# Patient Record
Sex: Male | Born: 1960 | Race: White | Hispanic: No | Smoking: Current every day smoker
Health system: Southern US, Community
[De-identification: ages and names within clinical notes are randomized; demographics above are authoritative.]

## PROBLEM LIST (undated history)

## (undated) DIAGNOSIS — I779 Disorder of arteries and arterioles, unspecified: Secondary | ICD-10-CM

## (undated) DIAGNOSIS — I4891 Unspecified atrial fibrillation: Secondary | ICD-10-CM

## (undated) DIAGNOSIS — S31119A Laceration without foreign body of abdominal wall, unspecified quadrant without penetration into peritoneal cavity, initial encounter: Secondary | ICD-10-CM

## (undated) DIAGNOSIS — Z8619 Personal history of other infectious and parasitic diseases: Secondary | ICD-10-CM

## (undated) DIAGNOSIS — I639 Cerebral infarction, unspecified: Secondary | ICD-10-CM

## (undated) DIAGNOSIS — F39 Unspecified mood [affective] disorder: Secondary | ICD-10-CM

## (undated) DIAGNOSIS — C61 Malignant neoplasm of prostate: Secondary | ICD-10-CM

## (undated) DIAGNOSIS — G459 Transient cerebral ischemic attack, unspecified: Secondary | ICD-10-CM

## (undated) DIAGNOSIS — K219 Gastro-esophageal reflux disease without esophagitis: Secondary | ICD-10-CM

## (undated) DIAGNOSIS — Z85828 Personal history of other malignant neoplasm of skin: Secondary | ICD-10-CM

## (undated) DIAGNOSIS — I739 Peripheral vascular disease, unspecified: Secondary | ICD-10-CM

## (undated) DIAGNOSIS — R519 Headache, unspecified: Secondary | ICD-10-CM

## (undated) DIAGNOSIS — F32A Depression, unspecified: Secondary | ICD-10-CM

## (undated) DIAGNOSIS — R29818 Other symptoms and signs involving the nervous system: Secondary | ICD-10-CM

## (undated) DIAGNOSIS — I10 Essential (primary) hypertension: Secondary | ICD-10-CM

## (undated) DIAGNOSIS — I480 Paroxysmal atrial fibrillation: Secondary | ICD-10-CM

## (undated) DIAGNOSIS — E785 Hyperlipidemia, unspecified: Secondary | ICD-10-CM

## (undated) DIAGNOSIS — IMO0002 Reserved for concepts with insufficient information to code with codable children: Secondary | ICD-10-CM

## (undated) DIAGNOSIS — G4700 Insomnia, unspecified: Secondary | ICD-10-CM

## (undated) DIAGNOSIS — F419 Anxiety disorder, unspecified: Secondary | ICD-10-CM

## (undated) DIAGNOSIS — R51 Headache: Secondary | ICD-10-CM

## (undated) DIAGNOSIS — F329 Major depressive disorder, single episode, unspecified: Secondary | ICD-10-CM

## (undated) HISTORY — PX: STOMACH SURGERY: SHX791

## (undated) HISTORY — DX: Personal history of other infectious and parasitic diseases: Z86.19

## (undated) HISTORY — DX: Cerebral infarction, unspecified: I63.9

## (undated) HISTORY — DX: Paroxysmal atrial fibrillation: I48.0

## (undated) HISTORY — DX: Hyperlipidemia, unspecified: E78.5

## (undated) HISTORY — DX: Unspecified atrial fibrillation: I48.91

## (undated) HISTORY — DX: Personal history of other malignant neoplasm of skin: Z85.828

## (undated) HISTORY — DX: Insomnia, unspecified: G47.00

## (undated) HISTORY — DX: Unspecified mood (affective) disorder: F39

## (undated) HISTORY — DX: Gastro-esophageal reflux disease without esophagitis: K21.9

## (undated) HISTORY — DX: Malignant neoplasm of prostate: C61

## (undated) HISTORY — DX: Transient cerebral ischemic attack, unspecified: G45.9

## (undated) HISTORY — PX: OTHER SURGICAL HISTORY: SHX169

## (undated) HISTORY — DX: Laceration without foreign body of abdominal wall, unspecified quadrant without penetration into peritoneal cavity, initial encounter: S31.119A

## (undated) HISTORY — PX: CERVICAL FUSION: SHX112

## (undated) HISTORY — DX: Reserved for concepts with insufficient information to code with codable children: IMO0002

---

## 2004-07-27 HISTORY — PX: LUMBAR DISC SURGERY: SHX700

## 2004-12-18 ENCOUNTER — Observation Stay (HOSPITAL_COMMUNITY): Admission: RE | Admit: 2004-12-18 | Discharge: 2004-12-19 | Payer: Self-pay | Admitting: Orthopedic Surgery

## 2005-06-04 ENCOUNTER — Emergency Department (HOSPITAL_COMMUNITY): Admission: EM | Admit: 2005-06-04 | Discharge: 2005-06-04 | Payer: Self-pay | Admitting: Emergency Medicine

## 2005-09-27 ENCOUNTER — Emergency Department (HOSPITAL_COMMUNITY): Admission: EM | Admit: 2005-09-27 | Discharge: 2005-09-27 | Payer: Self-pay | Admitting: Emergency Medicine

## 2005-12-30 ENCOUNTER — Ambulatory Visit (HOSPITAL_COMMUNITY): Admission: RE | Admit: 2005-12-30 | Discharge: 2005-12-30 | Payer: Self-pay | Admitting: Orthopedic Surgery

## 2006-03-23 ENCOUNTER — Emergency Department (HOSPITAL_COMMUNITY): Admission: EM | Admit: 2006-03-23 | Discharge: 2006-03-23 | Payer: Self-pay | Admitting: Emergency Medicine

## 2006-08-28 ENCOUNTER — Emergency Department (HOSPITAL_COMMUNITY): Admission: EM | Admit: 2006-08-28 | Discharge: 2006-08-28 | Payer: Self-pay | Admitting: Emergency Medicine

## 2006-12-26 ENCOUNTER — Emergency Department (HOSPITAL_COMMUNITY): Admission: EM | Admit: 2006-12-26 | Discharge: 2006-12-26 | Payer: Self-pay | Admitting: Emergency Medicine

## 2007-05-29 ENCOUNTER — Emergency Department (HOSPITAL_COMMUNITY): Admission: EM | Admit: 2007-05-29 | Discharge: 2007-05-29 | Payer: Self-pay | Admitting: Emergency Medicine

## 2007-12-08 ENCOUNTER — Emergency Department (HOSPITAL_COMMUNITY): Admission: EM | Admit: 2007-12-08 | Discharge: 2007-12-08 | Payer: Self-pay | Admitting: Emergency Medicine

## 2008-08-23 ENCOUNTER — Ambulatory Visit (HOSPITAL_COMMUNITY): Admission: RE | Admit: 2008-08-23 | Discharge: 2008-08-23 | Payer: Self-pay | Admitting: Internal Medicine

## 2008-12-24 ENCOUNTER — Emergency Department (HOSPITAL_COMMUNITY): Admission: EM | Admit: 2008-12-24 | Discharge: 2008-12-24 | Payer: Self-pay | Admitting: Emergency Medicine

## 2008-12-26 ENCOUNTER — Ambulatory Visit (HOSPITAL_COMMUNITY): Admission: RE | Admit: 2008-12-26 | Discharge: 2008-12-26 | Payer: Self-pay | Admitting: Orthopaedic Surgery

## 2009-01-04 ENCOUNTER — Encounter (HOSPITAL_COMMUNITY): Admission: RE | Admit: 2009-01-04 | Discharge: 2009-02-03 | Payer: Self-pay | Admitting: Orthopaedic Surgery

## 2009-07-29 ENCOUNTER — Encounter: Admission: RE | Admit: 2009-07-29 | Discharge: 2009-09-11 | Payer: Self-pay | Admitting: Neurosurgery

## 2009-09-03 ENCOUNTER — Ambulatory Visit (HOSPITAL_COMMUNITY): Admission: RE | Admit: 2009-09-03 | Discharge: 2009-09-03 | Payer: Self-pay | Admitting: Neurosurgery

## 2009-11-12 ENCOUNTER — Ambulatory Visit (HOSPITAL_COMMUNITY): Admission: RE | Admit: 2009-11-12 | Discharge: 2009-11-13 | Payer: Self-pay | Admitting: Neurosurgery

## 2010-02-03 ENCOUNTER — Emergency Department (HOSPITAL_COMMUNITY): Admission: EM | Admit: 2010-02-03 | Discharge: 2010-02-03 | Payer: Self-pay | Admitting: Emergency Medicine

## 2010-05-23 ENCOUNTER — Encounter: Payer: Self-pay | Admitting: Cardiology

## 2010-05-29 ENCOUNTER — Encounter: Payer: Self-pay | Admitting: Cardiology

## 2010-06-20 ENCOUNTER — Ambulatory Visit (HOSPITAL_COMMUNITY): Admission: RE | Admit: 2010-06-20 | Discharge: 2010-06-20 | Payer: Self-pay | Admitting: Neurology

## 2010-07-14 ENCOUNTER — Encounter (INDEPENDENT_AMBULATORY_CARE_PROVIDER_SITE_OTHER): Payer: Self-pay | Admitting: *Deleted

## 2010-07-14 ENCOUNTER — Encounter: Payer: Self-pay | Admitting: Physician Assistant

## 2010-07-14 ENCOUNTER — Ambulatory Visit: Payer: Self-pay | Admitting: Cardiology

## 2010-07-14 DIAGNOSIS — R002 Palpitations: Secondary | ICD-10-CM

## 2010-07-14 DIAGNOSIS — I10 Essential (primary) hypertension: Secondary | ICD-10-CM

## 2010-07-14 DIAGNOSIS — R072 Precordial pain: Secondary | ICD-10-CM

## 2010-07-14 DIAGNOSIS — Z720 Tobacco use: Secondary | ICD-10-CM

## 2010-07-14 DIAGNOSIS — R0602 Shortness of breath: Secondary | ICD-10-CM

## 2010-07-14 DIAGNOSIS — R0609 Other forms of dyspnea: Secondary | ICD-10-CM | POA: Insufficient documentation

## 2010-07-14 DIAGNOSIS — G4700 Insomnia, unspecified: Secondary | ICD-10-CM

## 2010-07-14 DIAGNOSIS — E785 Hyperlipidemia, unspecified: Secondary | ICD-10-CM | POA: Insufficient documentation

## 2010-07-14 DIAGNOSIS — R0989 Other specified symptoms and signs involving the circulatory and respiratory systems: Secondary | ICD-10-CM

## 2010-07-14 DIAGNOSIS — F411 Generalized anxiety disorder: Secondary | ICD-10-CM

## 2010-07-14 DIAGNOSIS — R7989 Other specified abnormal findings of blood chemistry: Secondary | ICD-10-CM | POA: Insufficient documentation

## 2010-07-25 ENCOUNTER — Encounter: Payer: Self-pay | Admitting: Physician Assistant

## 2010-08-04 ENCOUNTER — Encounter (INDEPENDENT_AMBULATORY_CARE_PROVIDER_SITE_OTHER): Payer: Self-pay | Admitting: *Deleted

## 2010-08-26 NOTE — Letter (Signed)
Summary: External Correspondence/ PROGRESS NOTE DR. TYSINGER  External Correspondence/ PROGRESS NOTE DR. TYSINGER   Imported By: Dorise Hiss 06/11/2010 11:20:26  _____________________________________________________________________  External Attachment:    Type:   Image     Comment:   External Document

## 2010-08-26 NOTE — Letter (Signed)
Summary: External Correspondence/ PROGRESS NOTE DR. TYSINGER  External Correspondence/ PROGRESS NOTE DR. TYSINGER   Imported By: Dorise Hiss 06/11/2010 11:18:46  _____________________________________________________________________  External Attachment:    Type:   Image     Comment:   External Document

## 2010-08-27 ENCOUNTER — Ambulatory Visit: Admit: 2010-08-27 | Payer: Self-pay | Admitting: Cardiology

## 2010-08-27 ENCOUNTER — Ambulatory Visit: Payer: Medicaid Other | Admitting: Cardiology

## 2010-08-28 NOTE — Letter (Signed)
Summary: Internal Other/ PATIENT HISTORY FORM  Internal Other/ PATIENT HISTORY FORM   Imported By: Dorise Hiss 07/18/2010 10:49:15  _____________________________________________________________________  External Attachment:    Type:   Image     Comment:   External Document
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
## 2010-08-28 NOTE — Letter (Signed)
Summary: Dobutamine Echocardiogram  [facility] at [facility] [address]   [place], [place] [address]   Phone: [phone]  Fax: [phone]      [facility]  Dobutamine Echocardiogram    Jonelle Sidle  Appointment Date:_  Appointment Time:_   Your doctor has ordered a Dobutamine echo to help determine the condition of our heart. If you take blood pressure medication, ask your doctor if you should take your medications the day of the procedure. Do not eat and drink 4 hours before the test is scheduled.  You will be asked to undress from the waist up and given a hospital gown to wear, so dress comfortaly from the waist down.  You will need to register at the Outpatient Department at [facility], located at the front enttrance of the hospital. Make sure to bring your insurance information and a form of ID.  Your appointment will last approximately one and one-half hours

## 2010-08-28 NOTE — Letter (Signed)
Summary: Engineer, materials at [facility]  [address]   [place], [place] [address]   Phone: [phone]  Fax: [phone]        August 04, 2010 MRN: [id number]   GIOMAR GUSLER [address] [place], [place]  [address]   Dear Mr. HARTSELL,  Your test ordered by Selena Batten has been reviewed by your physician (or physician assistant) and was found to be normal or stable. Your physician (or physician assistant) felt no changes were needed at this time.  __X__ Echocardiogram  __X__ Cardiac Stress Test  ____ Lab Work  ____ Peripheral vascular study of arms, legs or neck  ____ CT scan or X-ray  ____ Lung or Breathing test  ____ Other:   Thank you.   Hoover Brunette, LPN    Duane Boston, M.D., F.A.C.C. Thressa Sheller, M.D., F.A.C.C. Oneal Grout, M.D., F.A.C.C. Cheree Ditto, M.D., F.A.C.C. Daiva Nakayama, M.D., F.A.C.C. Kenney Houseman, M.D., F.A.C.C. Jeanne Ivan, PA-C

## 2010-10-12 LAB — BASIC METABOLIC PANEL
BUN: 12 mg/dL (ref 6–23)
Chloride: 103 mEq/L (ref 96–112)
GFR calc non Af Amer: >60 mL/min (ref >60)
Potassium: 3.7 mEq/L (ref 3.5–5.1)
Sodium: 135 mEq/L (ref 135–145)

## 2010-10-15 LAB — BASIC METABOLIC PANEL
CO2: 29 mEq/L (ref 19–32)
Chloride: 102 mEq/L (ref 96–112)
Creatinine, Ser: 1 mg/dL (ref 0.4–1.5)
GFR calc Af Amer: >60 mL/min (ref >60)
Potassium: 4 mEq/L (ref 3.5–5.1)

## 2010-10-15 LAB — CBC
HCT: 47.2 % (ref 39.0–52.0)
Hemoglobin: 16.8 g/dL (ref 13.0–17.0)
MCHC: 35.5 g/dL (ref 30.0–36.0)
MCV: 95.7 fL (ref 78.0–100.0)
RBC: 4.93 MIL/uL (ref 4.22–5.81)
WBC: 6.4 10*3/uL (ref 4.0–10.5)

## 2010-10-15 LAB — HEPATIC FUNCTION PANEL
AST: 21 U/L (ref 0–37)
Albumin: 4.1 g/dL (ref 3.5–5.2)

## 2010-10-15 LAB — SURGICAL PCR SCREEN
MRSA, PCR: NEGATIVE
Staphylococcus aureus: NEGATIVE

## 2010-10-15 LAB — PROTIME-INR: INR: 0.97 (ref 0.00–1.49)

## 2010-12-12 NOTE — Op Note (Signed)
NAMEMONTREZ, Calvin                ACCOUNT NO.:  [id number]   MEDICAL RECORD NO.:  [id number]          PATIENT TYPE:  AMB   LOCATION:  DAY                          FACILITY:  [facility]   PHYSICIAN:  Georges Lynch. Gioffre, M.D.DATE OF BIRTH:  1961-01-20   DATE OF PROCEDURE:  12/18/2004  DATE OF DISCHARGE:                                 OPERATIVE REPORT   SURGEON:  Georges Lynch. Darrelyn Hillock, M.D.   ASSISTANT:  Marlowe Kays, M.D.   PREOPERATIVE DIAGNOSIS:  Large herniated lumbar disk, L5-S1, on the right.   POSTOPERATIVE DIAGNOSIS:  Large herniated lumbar disk, L5-S1, on the right.   OPERATION:  Hemilaminectomy and microdiskectomy at L5-S1 on the right.   PROCEDURE:  Under general anesthesia, routine orthopedic prepping and  draping of the lower back carried out. The patient was given 1 g of IV  Ancef. Two needles were placed in the back for localization purposes. An x-  ray was taken to verify the position. Once we identified the L5-S1 position,  incision was made directly over the interspace. Bleeders identified and  cauterized. I then stripped the muscle from the lamina and spinous process  in usual fashion, identified the sacrum, and then L5-S1 interspace. The self-  retaining _____________ retractors were inserted. I then went down and did a  hemilaminectomy in usual fashion at L5-S1, identified ligamentum flavum, and  removed the ligamentum flavum with great care taken not to injure the  underlying dura. At this time, we identified the dura and the S1 root. We  gently retracted the root and the dura. We cauterized the lateral recessed  vein with a bipolar. We identified the disk. He had a large herniated disk.  The cruciate incision was made in the disk space, and complete diskectomy  was carried out. We made multiple passes into the disk space and  subligamentous to make sure there were no other fragments. There was not. I  then utilized the hockey-stick, and I went out the foramen,  followed the S1  root. It was now clear, and the dura above and root above were fine. I then  irrigated out the area again thoroughly and then loosely applied the  thrombin-soaked Gelfoam. I closed the wound and layered in the usual  fashion. I left the distal deep part of the wound partially open for  drainage purposes as well, and the remaining part of the wound was closed  with 0 Vicryl. The skin was closed with metal staples, and a sterile  neosporin dressing was applied. Prior to closing the skin, I injected 20 cc  of half percent Marcaine and epinephrine into the underlying muscle, and he  was given 30 mg IV of Toradol.      RAG/MEDQ  D:  12/18/2004  T:  12/18/2004  Job:  [id number]

## 2011-02-11 ENCOUNTER — Other Ambulatory Visit: Payer: Self-pay | Admitting: Medical

## 2011-02-17 ENCOUNTER — Other Ambulatory Visit: Payer: Self-pay | Admitting: Medical

## 2011-03-03 ENCOUNTER — Other Ambulatory Visit: Payer: Self-pay | Admitting: Medical

## 2011-04-23 ENCOUNTER — Other Ambulatory Visit: Payer: Self-pay

## 2011-04-23 ENCOUNTER — Encounter: Payer: Self-pay | Admitting: Vascular Surgery

## 2011-04-24 ENCOUNTER — Other Ambulatory Visit: Payer: Medicaid Other

## 2011-04-27 ENCOUNTER — Encounter: Payer: Self-pay | Admitting: Vascular Surgery

## 2011-04-28 ENCOUNTER — Encounter: Payer: Medicaid Other | Admitting: Vascular Surgery

## 2011-06-26 ENCOUNTER — Other Ambulatory Visit: Payer: Self-pay

## 2011-07-01 ENCOUNTER — Encounter: Payer: Self-pay | Admitting: Vascular Surgery

## 2011-07-28 DIAGNOSIS — I639 Cerebral infarction, unspecified: Secondary | ICD-10-CM

## 2011-07-28 HISTORY — DX: Cerebral infarction, unspecified: I63.9

## 2011-08-05 ENCOUNTER — Encounter: Payer: Self-pay | Admitting: Vascular Surgery

## 2011-08-06 ENCOUNTER — Encounter: Payer: Self-pay | Admitting: Vascular Surgery

## 2011-08-06 ENCOUNTER — Ambulatory Visit (INDEPENDENT_AMBULATORY_CARE_PROVIDER_SITE_OTHER): Payer: Medicaid Other | Admitting: *Deleted

## 2011-08-06 ENCOUNTER — Ambulatory Visit (INDEPENDENT_AMBULATORY_CARE_PROVIDER_SITE_OTHER): Payer: Medicaid Other | Admitting: Vascular Surgery

## 2011-08-06 VITALS — BP 88/45 | HR 70 | Resp 16 | Ht 75.0 in | Wt 221.0 lb

## 2011-08-06 DIAGNOSIS — I6529 Occlusion and stenosis of unspecified carotid artery: Secondary | ICD-10-CM

## 2011-08-06 DIAGNOSIS — I779 Disorder of arteries and arterioles, unspecified: Secondary | ICD-10-CM
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
## 2011-08-14 ENCOUNTER — Ambulatory Visit: Payer: Medicaid Other | Admitting: Urology

## 2012-02-03 ENCOUNTER — Encounter: Payer: Self-pay | Admitting: Neurosurgery

## 2012-02-04 ENCOUNTER — Ambulatory Visit: Payer: Medicaid Other | Admitting: Neurosurgery

## 2012-02-04 ENCOUNTER — Other Ambulatory Visit: Payer: Medicaid Other

## 2012-09-16 ENCOUNTER — Emergency Department (HOSPITAL_COMMUNITY): Payer: Medicaid Other

## 2012-09-16 ENCOUNTER — Emergency Department (HOSPITAL_COMMUNITY)
Admission: EM | Admit: 2012-09-16 | Discharge: 2012-09-16 | Disposition: A | Discharge status: Home / Self Care | Payer: Medicaid Other | Attending: Emergency Medicine | Admitting: Emergency Medicine

## 2012-09-16 ENCOUNTER — Encounter (HOSPITAL_COMMUNITY): Payer: Self-pay

## 2012-09-16 DIAGNOSIS — Z8639 Personal history of other endocrine, nutritional and metabolic disease: Secondary | ICD-10-CM | POA: Insufficient documentation

## 2012-09-16 DIAGNOSIS — Z87828 Personal history of other (healed) physical injury and trauma: Secondary | ICD-10-CM | POA: Insufficient documentation

## 2012-09-16 DIAGNOSIS — Z8659 Personal history of other mental and behavioral disorders: Secondary | ICD-10-CM | POA: Insufficient documentation

## 2012-09-16 DIAGNOSIS — Z79899 Other long term (current) drug therapy: Secondary | ICD-10-CM | POA: Insufficient documentation

## 2012-09-16 DIAGNOSIS — F17200 Nicotine dependence, unspecified, uncomplicated: Secondary | ICD-10-CM | POA: Insufficient documentation

## 2012-09-16 DIAGNOSIS — G8929 Other chronic pain: Secondary | ICD-10-CM | POA: Insufficient documentation

## 2012-09-16 DIAGNOSIS — K219 Gastro-esophageal reflux disease without esophagitis: Secondary | ICD-10-CM | POA: Insufficient documentation

## 2012-09-16 DIAGNOSIS — Z8619 Personal history of other infectious and parasitic diseases: Secondary | ICD-10-CM | POA: Insufficient documentation

## 2012-09-16 DIAGNOSIS — Z862 Personal history of diseases of the blood and blood-forming organs and certain disorders involving the immune mechanism: Secondary | ICD-10-CM | POA: Insufficient documentation

## 2012-09-16 DIAGNOSIS — S8990XA Unspecified injury of unspecified lower leg, initial encounter: Secondary | ICD-10-CM | POA: Insufficient documentation

## 2012-09-16 DIAGNOSIS — Z8739 Personal history of other diseases of the musculoskeletal system and connective tissue: Secondary | ICD-10-CM | POA: Insufficient documentation

## 2012-09-16 DIAGNOSIS — M549 Dorsalgia, unspecified: Secondary | ICD-10-CM | POA: Insufficient documentation

## 2012-09-16 DIAGNOSIS — Z8673 Personal history of transient ischemic attack (TIA), and cerebral infarction without residual deficits: Secondary | ICD-10-CM | POA: Insufficient documentation

## 2012-09-16 DIAGNOSIS — M542 Cervicalgia: Secondary | ICD-10-CM | POA: Insufficient documentation

## 2012-09-16 DIAGNOSIS — Z791 Long term (current) use of non-steroidal anti-inflammatories (NSAID): Secondary | ICD-10-CM | POA: Insufficient documentation

## 2012-09-16 DIAGNOSIS — G4700 Insomnia, unspecified: Secondary | ICD-10-CM | POA: Insufficient documentation

## 2012-09-16 DIAGNOSIS — I10 Essential (primary) hypertension: Secondary | ICD-10-CM | POA: Insufficient documentation

## 2012-09-16 DIAGNOSIS — S8992XA Unspecified injury of left lower leg, initial encounter: Secondary | ICD-10-CM

## 2012-09-16 MED ORDER — HYDROMORPHONE HCL PF 2 MG/ML IJ SOLN
2.0000 mg | Freq: Once | INTRAMUSCULAR | Status: AC
  Administered 2012-09-16: 2 mg via INTRAMUSCULAR
  Filled 2012-09-16: qty 1

## 2012-09-16 MED ORDER — HYDROCODONE-ACETAMINOPHEN 5-325 MG PO TABS: 1.0000 | ORAL_TABLET | Freq: Four times a day (QID) | ORAL | Status: DC | PRN

## 2012-09-16 MED ORDER — NAPROXEN 500 MG PO TABS: 500.0000 mg | ORAL_TABLET | Freq: Two times a day (BID) | ORAL | Status: DC
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
## 2012-09-28 ENCOUNTER — Telehealth: Payer: Self-pay | Admitting: Orthopedic Surgery

## 2012-09-28 NOTE — Telephone Encounter (Signed)
Patient called, then immediately stopped in to our office requesting appointment for evaluation following Emergency Room visit at [facility].  He states it is for left knee; report indicates chronic back as main problem.  We reviewed insurance requirement per his [place] Medicaid, which requires referral from primary care physician prior to scheduling appointment, as well as need for Dr. Romeo Apple to review prior to scheduling if we receive the referral.  Patient's ph# [phone]. Patient understands and will try to contact primary care.

## 2012-11-24 ENCOUNTER — Telehealth: Payer: Self-pay | Admitting: Medical

## 2012-11-24 NOTE — Telephone Encounter (Signed)
error 

## 2013-03-29 ENCOUNTER — Telehealth: Payer: Self-pay | Admitting: Orthopedic Surgery

## 2013-03-29 NOTE — Telephone Encounter (Signed)
Call received from Charlene at Dr. Otilio Saber office, ph [phone], states patient was seen for the first time in their office, 03/28/13, and is requesting referral appointment for right knee pain. Advised office note to be faxed, and Dr. Romeo Apple may need to review and advise.  * Please review frequent emergency room visits, Xrays, and previous office and operative notes. Patient had seen both Dr. Romeo Apple and Dr. Hilda Lias. Please advise regarding appointment for right knee.  Patient ph# [phone].

## 2014-05-17 ENCOUNTER — Observation Stay (HOSPITAL_COMMUNITY)
Admission: EM | Admit: 2014-05-17 | Discharge: 2014-05-18 | Disposition: A | Discharge status: Home / Self Care | Payer: Medicaid Other | Attending: Internal Medicine | Admitting: Internal Medicine

## 2014-05-17 ENCOUNTER — Emergency Department (HOSPITAL_COMMUNITY): Payer: Medicaid Other

## 2014-05-17 ENCOUNTER — Encounter (HOSPITAL_COMMUNITY): Payer: Self-pay | Admitting: Emergency Medicine

## 2014-05-17 DIAGNOSIS — F17200 Nicotine dependence, unspecified, uncomplicated: Secondary | ICD-10-CM

## 2014-05-17 DIAGNOSIS — Z8673 Personal history of transient ischemic attack (TIA), and cerebral infarction without residual deficits: Secondary | ICD-10-CM | POA: Diagnosis not present

## 2014-05-17 DIAGNOSIS — E782 Mixed hyperlipidemia: Secondary | ICD-10-CM

## 2014-05-17 DIAGNOSIS — R0789 Other chest pain: Principal | ICD-10-CM | POA: Insufficient documentation

## 2014-05-17 DIAGNOSIS — E785 Hyperlipidemia, unspecified: Secondary | ICD-10-CM | POA: Diagnosis present

## 2014-05-17 DIAGNOSIS — I10 Essential (primary) hypertension: Secondary | ICD-10-CM | POA: Diagnosis not present

## 2014-05-17 DIAGNOSIS — F39 Unspecified mood [affective] disorder: Secondary | ICD-10-CM | POA: Diagnosis not present

## 2014-05-17 DIAGNOSIS — Z79899 Other long term (current) drug therapy: Secondary | ICD-10-CM | POA: Diagnosis not present

## 2014-05-17 DIAGNOSIS — G4700 Insomnia, unspecified: Secondary | ICD-10-CM | POA: Insufficient documentation

## 2014-05-17 DIAGNOSIS — Z8619 Personal history of other infectious and parasitic diseases: Secondary | ICD-10-CM | POA: Diagnosis not present

## 2014-05-17 DIAGNOSIS — Z791 Long term (current) use of non-steroidal anti-inflammatories (NSAID): Secondary | ICD-10-CM | POA: Diagnosis not present

## 2014-05-17 DIAGNOSIS — R002 Palpitations: Secondary | ICD-10-CM

## 2014-05-17 DIAGNOSIS — I209 Angina pectoris, unspecified: Secondary | ICD-10-CM

## 2014-05-17 DIAGNOSIS — M5116 Intervertebral disc disorders with radiculopathy, lumbar region: Secondary | ICD-10-CM | POA: Diagnosis not present

## 2014-05-17 DIAGNOSIS — K219 Gastro-esophageal reflux disease without esophagitis: Secondary | ICD-10-CM | POA: Diagnosis not present

## 2014-05-17 DIAGNOSIS — I6522 Occlusion and stenosis of left carotid artery: Secondary | ICD-10-CM

## 2014-05-17 DIAGNOSIS — N179 Acute kidney failure, unspecified: Secondary | ICD-10-CM | POA: Diagnosis present

## 2014-05-17 DIAGNOSIS — Z87828 Personal history of other (healed) physical injury and trauma: Secondary | ICD-10-CM | POA: Insufficient documentation

## 2014-05-17 DIAGNOSIS — R079 Chest pain, unspecified: Secondary | ICD-10-CM | POA: Diagnosis present

## 2014-05-17 DIAGNOSIS — Z720 Tobacco use: Secondary | ICD-10-CM | POA: Diagnosis present

## 2014-05-17 DIAGNOSIS — I517 Cardiomegaly: Secondary | ICD-10-CM

## 2014-05-17 DIAGNOSIS — R0602 Shortness of breath: Secondary | ICD-10-CM

## 2014-05-17 DIAGNOSIS — R072 Precordial pain: Secondary | ICD-10-CM

## 2014-05-17 HISTORY — DX: Essential (primary) hypertension: I10

## 2014-05-17 HISTORY — DX: Peripheral vascular disease, unspecified: I73.9

## 2014-05-17 HISTORY — DX: Disorder of arteries and arterioles, unspecified: I77.9

## 2014-05-17 LAB — CBC
HCT: 44 % (ref 39.0–52.0)
HCT: 44.4 % (ref 39.0–52.0)
Hemoglobin: 16.3 g/dL (ref 13.0–17.0)
Hemoglobin: 16.5 g/dL (ref 13.0–17.0)
MCH: 32.9 pg (ref 26.0–34.0)
MCH: 33.1 pg (ref 26.0–34.0)
MCHC: 37 g/dL — ABNORMAL HIGH (ref 30.0–36.0)
MCHC: 37.2 g/dL — AB (ref 30.0–36.0)
MCV: 88.9 fL (ref 78.0–100.0)
MCV: 89 fL (ref 78.0–100.0)
PLATELETS: 225 10*3/uL (ref 150–400)
Platelets: 209 10*3/uL (ref 150–400)
RBC: 4.95 MIL/uL (ref 4.22–5.81)
RBC: 4.99 MIL/uL (ref 4.22–5.81)
RDW: 13.5 % (ref 11.5–15.5)
RDW: 13.5 % (ref 11.5–15.5)
WBC: 8.3 10*3/uL (ref 4.0–10.5)
WBC: 8 10*3/uL (ref 4.0–10.5)

## 2014-05-17 LAB — BASIC METABOLIC PANEL
ANION GAP: 13 (ref 5–15)
BUN: 11 mg/dL (ref 6–23)
CALCIUM: 9.4 mg/dL (ref 8.4–10.5)
CO2: 27 meq/L (ref 19–32)
CREATININE: 1.22 mg/dL (ref 0.50–1.35)
Chloride: 97 mEq/L (ref 96–112)
GFR calc Af Amer: 76 mL/min — ABNORMAL LOW (ref >90)
GFR calc non Af Amer: 66 mL/min — ABNORMAL LOW (ref >90)
GLUCOSE: 92 mg/dL (ref 70–99)
Potassium: 3.4 mEq/L — ABNORMAL LOW (ref 3.7–5.3)
Sodium: 137 mEq/L (ref 137–147)

## 2014-05-17 LAB — CREATININE, SERUM
Creatinine, Ser: 1.35 mg/dL (ref 0.50–1.35)
GFR calc Af Amer: 67 mL/min — ABNORMAL LOW (ref >90)
GFR, EST NON AFRICAN AMERICAN: 58 mL/min — AB (ref >90)

## 2014-05-17 LAB — TROPONIN I
Troponin I: <0.3 ng/mL (ref <0.30)
Troponin I: <0.3 ng/mL (ref <0.30)
Troponin I: <0.3 ng/mL (ref <0.30)
Troponin I: <0.3 ng/mL (ref <0.30)

## 2014-05-17 MED ORDER — NITROGLYCERIN 0.4 MG SL SUBL: 0.4000 mg | SUBLINGUAL_TABLET | SUBLINGUAL | Status: DC | PRN

## 2014-05-17 MED ORDER — REGADENOSON 0.4 MG/5ML IV SOLN
0.4000 mg | Freq: Once | INTRAVENOUS | Status: DC
  Filled 2014-05-17: qty 5

## 2014-05-17 MED ORDER — ASPIRIN 325 MG PO TABS
325.0000 mg | ORAL_TABLET | Freq: Every day | ORAL | Status: DC
  Administered 2014-05-18: 325 mg via ORAL
  Filled 2014-05-17: qty 1

## 2014-05-17 MED ORDER — CYCLOBENZAPRINE HCL 10 MG PO TABS
10.0000 mg | ORAL_TABLET | Freq: Once | ORAL | Status: AC
  Administered 2014-05-17: 10 mg via ORAL
  Filled 2014-05-17: qty 1

## 2014-05-17 MED ORDER — TRIAMTERENE-HCTZ 37.5-25 MG PO TABS
1.0000 | ORAL_TABLET | Freq: Every day | ORAL | Status: DC
  Administered 2014-05-17 – 2014-05-18 (×2): 1 via ORAL
  Filled 2014-05-17 (×2): qty 1

## 2014-05-17 MED ORDER — ENOXAPARIN SODIUM 40 MG/0.4ML ~~LOC~~ SOLN
40.0000 mg | SUBCUTANEOUS | Status: DC
  Administered 2014-05-17: 40 mg via SUBCUTANEOUS
  Filled 2014-05-17: qty 0.4

## 2014-05-17 MED ORDER — MORPHINE SULFATE 4 MG/ML IJ SOLN
4.0000 mg | Freq: Once | INTRAMUSCULAR | Status: AC
  Administered 2014-05-17: 4 mg via INTRAVENOUS
  Filled 2014-05-17: qty 1

## 2014-05-17 MED ORDER — PANTOPRAZOLE SODIUM 40 MG PO TBEC
40.0000 mg | DELAYED_RELEASE_TABLET | Freq: Every day | ORAL | Status: DC
  Administered 2014-05-17 – 2014-05-18 (×2): 40 mg via ORAL
  Filled 2014-05-17 (×2): qty 1

## 2014-05-17 MED ORDER — GI COCKTAIL ~~LOC~~
30.0000 mL | Freq: Four times a day (QID) | ORAL | Status: DC | PRN
Start: 2014-05-17 — End: 2014-05-18

## 2014-05-17 MED ORDER — ONDANSETRON HCL 4 MG/2ML IJ SOLN
4.0000 mg | Freq: Four times a day (QID) | INTRAMUSCULAR | Status: DC | PRN
  Administered 2014-05-17: 4 mg via INTRAVENOUS
  Filled 2014-05-17: qty 2

## 2014-05-17 MED ORDER — TRAZODONE HCL 50 MG PO TABS
50.0000 mg | ORAL_TABLET | Freq: Every day | ORAL | Status: DC
  Administered 2014-05-17: 50 mg via ORAL
  Filled 2014-05-17: qty 1

## 2014-05-17 MED ORDER — ACETAMINOPHEN 325 MG PO TABS
650.0000 mg | ORAL_TABLET | ORAL | Status: DC | PRN
  Administered 2014-05-18: 650 mg via ORAL
  Filled 2014-05-17: qty 2

## 2014-05-17 MED ORDER — MORPHINE SULFATE 2 MG/ML IJ SOLN
1.0000 mg | INTRAMUSCULAR | Status: DC | PRN
  Administered 2014-05-17 – 2014-05-18 (×3): 1 mg via INTRAVENOUS
  Filled 2014-05-17 (×3): qty 1

## 2014-05-17 MED ORDER — NITROGLYCERIN 2 % TD OINT
1.0000 [in_us] | TOPICAL_OINTMENT | Freq: Once | TRANSDERMAL | Status: AC
  Administered 2014-05-17: 1 [in_us] via TOPICAL
  Filled 2014-05-17: qty 1

## 2014-05-17 MED ORDER — SODIUM CHLORIDE 0.9 % IV SOLN
INTRAVENOUS | Status: DC
  Administered 2014-05-17: 15:00:00 via INTRAVENOUS

## 2014-05-17 MED ORDER — ASPIRIN 325 MG PO TABS
325.0000 mg | ORAL_TABLET | Freq: Once | ORAL | Status: AC
  Administered 2014-05-17: 325 mg via ORAL
  Filled 2014-05-17: qty 1
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
## 2014-05-17 NOTE — ED Notes (Signed)
Chest pain and sob off and on times one month.  Since yesterday the cp has been a constant sharp pain .  Rates a 5/10.
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
## 2014-05-17 NOTE — H&P (Signed)
Patient seen and examined. Agree with note by Dyanne Carrel, NP. Patient here with CP with significant risk factors, HEART score of 6 and an abnormal EKG. Has already been evaluated by cardiology with plans for a stress test in am as long as he rules out.  Domingo Mend, MD [facility] Pager: [phone]

## 2014-05-17 NOTE — Progress Notes (Signed)
  Echocardiogram 2D Echocardiogram has been performed.  [place], [place] 05/17/2014, 4:31 PM

## 2014-05-18 ENCOUNTER — Encounter (HOSPITAL_COMMUNITY): Payer: Self-pay

## 2014-05-18 ENCOUNTER — Observation Stay (HOSPITAL_COMMUNITY): Payer: Medicaid Other

## 2014-05-18 DIAGNOSIS — I10 Essential (primary) hypertension: Secondary | ICD-10-CM

## 2014-05-18 DIAGNOSIS — K219 Gastro-esophageal reflux disease without esophagitis: Secondary | ICD-10-CM | POA: Diagnosis not present

## 2014-05-18 DIAGNOSIS — N179 Acute kidney failure, unspecified: Secondary | ICD-10-CM | POA: Diagnosis present

## 2014-05-18 DIAGNOSIS — R0789 Other chest pain: Secondary | ICD-10-CM

## 2014-05-18 DIAGNOSIS — I6522 Occlusion and stenosis of left carotid artery: Secondary | ICD-10-CM

## 2014-05-18 DIAGNOSIS — E785 Hyperlipidemia, unspecified: Secondary | ICD-10-CM | POA: Diagnosis not present

## 2014-05-18 DIAGNOSIS — R079 Chest pain, unspecified: Secondary | ICD-10-CM

## 2014-05-18 HISTORY — DX: Acute kidney failure, unspecified: N17.9

## 2014-05-18 LAB — LIPID PANEL
Cholesterol: 195 mg/dL (ref 0–200)
HDL: 25 mg/dL — ABNORMAL LOW (ref >39)
LDL CALC: UNDETERMINED mg/dL (ref 0–99)
TRIGLYCERIDES: 624 mg/dL — AB (ref <150)
Total CHOL/HDL Ratio: 7.8 RATIO
VLDL: UNDETERMINED mg/dL (ref 0–40)

## 2014-05-18 LAB — CBC
HEMATOCRIT: 43.5 % (ref 39.0–52.0)
HEMOGLOBIN: 16 g/dL (ref 13.0–17.0)
MCH: 33.1 pg (ref 26.0–34.0)
MCHC: 36.8 g/dL — AB (ref 30.0–36.0)
MCV: 89.9 fL (ref 78.0–100.0)
Platelets: 238 10*3/uL (ref 150–400)
RBC: 4.84 MIL/uL (ref 4.22–5.81)
RDW: 13.5 % (ref 11.5–15.5)
WBC: 8 10*3/uL (ref 4.0–10.5)

## 2014-05-18 LAB — BASIC METABOLIC PANEL
ANION GAP: 13 (ref 5–15)
BUN: 15 mg/dL (ref 6–23)
CALCIUM: 9 mg/dL (ref 8.4–10.5)
CO2: 28 meq/L (ref 19–32)
CREATININE: 1.47 mg/dL — AB (ref 0.50–1.35)
Chloride: 98 mEq/L (ref 96–112)
GFR calc Af Amer: 61 mL/min — ABNORMAL LOW (ref >90)
GFR calc non Af Amer: 52 mL/min — ABNORMAL LOW (ref >90)
Glucose, Bld: 118 mg/dL — ABNORMAL HIGH (ref 70–99)
Potassium: 3.6 mEq/L — ABNORMAL LOW (ref 3.7–5.3)
Sodium: 139 mEq/L (ref 137–147)

## 2014-05-18 MED ORDER — SODIUM CHLORIDE 0.9 % IJ SOLN
INTRAMUSCULAR | Status: AC
  Administered 2014-05-18: 10 mL via INTRAVENOUS
  Filled 2014-05-18: qty 10

## 2014-05-18 MED ORDER — TECHNETIUM TC 99M SESTAMIBI - CARDIOLITE
10.0000 | Freq: Once | INTRAVENOUS | Status: AC | PRN
Start: 2014-05-18 — End: 2014-05-18
  Administered 2014-05-18: 10 via INTRAVENOUS

## 2014-05-18 MED ORDER — TECHNETIUM TC 99M SESTAMIBI GENERIC - CARDIOLITE
30.0000 | Freq: Once | INTRAVENOUS | Status: AC | PRN
Start: 2014-05-18 — End: 2014-05-18
  Administered 2014-05-18: 30 via INTRAVENOUS

## 2014-05-18 MED ORDER — REGADENOSON 0.4 MG/5ML IV SOLN
0.4000 mg | Freq: Once | INTRAVENOUS | Status: AC | PRN
  Administered 2014-05-18: 0.4 mg via INTRAVENOUS
  Filled 2014-05-18: qty 5

## 2014-05-18 MED ORDER — REGADENOSON 0.4 MG/5ML IV SOLN
INTRAVENOUS | Status: AC
  Administered 2014-05-18: 0.4 mg via INTRAVENOUS
  Filled 2014-05-18: qty 5

## 2014-05-18 MED ORDER — SODIUM CHLORIDE 0.9 % IJ SOLN
10.0000 mL | INTRAMUSCULAR | Status: DC | PRN
  Administered 2014-05-18: 10 mL via INTRAVENOUS

## 2014-05-18 MED ORDER — ASPIRIN EC 81 MG PO TBEC: 81.0000 mg | DELAYED_RELEASE_TABLET | Freq: Every day | ORAL | Status: DC

## 2014-05-18 NOTE — Progress Notes (Signed)
Stress Lab Nurses Notes - Brier Reid 05/18/2014 Reason for doing test: Chest Pain Type of test: [place] - Inpatient Rm 302 Nurse performing test: Gerrit Halls, RN Nuclear Medicine Tech: Melburn Hake Echo Tech: Not Applicable MD performing test: Branch/K.Purcell Nails NP Family MD: Bluth Test explained and consent signed: Yes.   IV started: Saline lock flushed, No redness or edema and Saline lock from floor Symptoms: nausea Treatment/Intervention: None Reason test stopped: protocol completed After recovery IV was: No redness or edema and Saline Lock flushed Patient to return to [place]. Med at : 9:45 Patient discharged: Transported back to room 302 via wc Patient's Condition upon discharge was: stable Comments: During test BP 130/75 & HR 97.  Recovery BP 123/73 & HR 84.  Symptoms resolved in recovery.  Geanie Cooley T

## 2014-05-18 NOTE — Progress Notes (Signed)
Pt discharged home today per Dr. Jerilee Hoh. Pt's VSS. Pt's IV site D/C'd and WDL. Pt provided with home medication list and discharge instructions. Verbalized understanding. Pt ambulated off floor in stable condition accompanied by NT and family.

## 2014-05-18 NOTE — Discharge Summary (Signed)
Physician Discharge Summary  Calvin Aguirre:[id number] DOB: October 18, 1960 DOA: 05/17/2014  PCP: Celedonio Savage, MD  Admit date: 05/17/2014 Discharge date: 05/18/2014  Time spent: 40 minutes  Recommendations for Outpatient Follow-up:  1. Follow up with PCP Dr. Selinda Flavin in 1-2 weeks for evaluation of symptoms. Recommend BMET to track kidney function. Evaluate lipid panel   Discharge Diagnoses:  Principal Problem:   Chest pain Active Problems:   Hyperlipidemia   TOBACCO ABUSE   Essential hypertension   Discharge Condition: stable  Diet recommendation: heart healthy  Filed Weights   05/17/14 0905 05/17/14 1413  Weight: 111.585 kg (246 lb) 111.585 kg (246 lb)    History of present illness:  Calvin Aguirre is a 53 y.o. male with a past medical history hyperlipidemia, hypertension, GERD, TIA, CAD, stroke presented to the emergency department 05/17/14 with the chief complaint of chest pain.  Patient reported left anterior chest pain for the last 4-5 weeks. He stated the pain was located left anterior chest and was sometimes sharp nonradiating another time a pressure feeling. He reported that had occurred with rest and exertion but had noticed that just shifting his weight in the chair or bed can intensify the pain. He denies nausea vomiting diaphoresis but does endorse some shortness of breath. He denied headache visual disturbances numbness or tingling of extremities. He denied any fever chills or recent illness.  Initial evaluation in the emergency department included basic metabolic panel significant for potassium level of 3.4, complete blood count was unremarkable, initial troponin negative. Chest x-ray Aortic arch atherosclerotic calcification, chronic. No acute  Findings.  In the emergency department he was afebrile hemodynamically stable and not hypoxic. He was given morphine with good relief. He was also provided with aspirin and Protonix. As well as nitroglycerin ointment.  Hospital  Course:  Principal Problem:  Chest pain: Risk factors include smoking, hypertension, CAD.  Initial troponin negative but EKG abnormal. Cardiac enzymes negative x3,  2-D echo mild LVH with EF 73% grade 1 diastolic dysfunction. Stress test negative. Evaluated by Dr Harl Bowie on day of discharge. He opined chest primarily position/pleuritic. No further cardiac testing warranted.    Active Problems:  AKi: mild. Will hold maxide at discharge. Recommend follow up BMET to track kidney function  Hyperlipidemia:  Consider statin  TOBACCO ABUSE: Cessation counseling offered   Essential hypertension: controlled  Procedures: Wall thickness was increased in a pattern of mild LVH. Systolic function was normal. The estimated ejection fraction was in the range of 55% to 60%. Wall motion was normal; there were no regional wall motion abnormalities. Doppler parameters are consistent with abnormal left ventricular relaxation (grade 1 diastolic dysfunction).   Consultations:  cardiology  Discharge Exam: Filed Vitals:   05/18/14 1029  BP: 124/80  Pulse:   Temp: 98 F (36.7 C)  Resp: 18    General: well nourished NAD Cardiovascular: RRR No MGR No LE edema Respiratory: normal effort BS clear bilaterally  Discharge Instructions You were cared for by a hospitalist during your hospital stay. If you have any questions about your discharge medications or the care you received while you were in the hospital after you are discharged, you can call the unit and asked to speak with the hospitalist on call if the hospitalist that took care of you is not available. Once you are discharged, your primary care physician will handle any further medical issues. Please note that NO REFILLS for any discharge medications will be authorized once you are discharged, as it  is imperative that you return to your primary care physician (or establish a relationship with a primary care physician if you do not have one) for your  aftercare needs so that they can reassess your need for medications and monitor your lab values.  Discharge Instructions   Diet - low sodium heart healthy    Complete by:  As directed      Discharge instructions    Complete by:  As directed   Take medication as prescribed Follow up with PCP 1-2 week for evaluation of symptom     Increase activity slowly    Complete by:  As directed           Current Discharge Medication List    CONTINUE these medications which have NOT CHANGED   Details  albuterol (PROVENTIL HFA;VENTOLIN HFA) 108 (90 BASE) MCG/ACT inhaler Inhale 2 puffs into the lungs every 4 (four) hours as needed for shortness of breath.     aspirin 325 MG tablet Take 325 mg by mouth daily.    cyclobenzaprine (FLEXERIL) 10 MG tablet Take 10 mg by mouth 2 (two) times daily as needed for muscle spasms.     ibuprofen (ADVIL,MOTRIN) 800 MG tablet Take 800 mg by mouth every 8 (eight) hours as needed for pain.    omeprazole (PRILOSEC) 20 MG capsule Take 20 mg by mouth daily.      traZODone (DESYREL) 50 MG tablet Take 300 mg by mouth at bedtime.       STOP taking these medications     triamterene-hydrochlorothiazide (MAXZIDE-25) 37.5-25 MG per tablet        No Known Allergies Follow-up Information   Follow up with Celedonio Savage, MD. Schedule an appointment as soon as possible for a visit in 2 weeks. (follow up with PCP for evaluation of symptoms)    Specialty:  Family Medicine   Contact information:   [place] [address] [phone]        The results of significant diagnostics from this hospitalization (including imaging, microbiology, ancillary and laboratory) are listed below for reference.    Significant Diagnostic Studies: Nm Myocar Multi W/spect W/wall Motion / Ef  05/18/2014   CLINICAL DATA:  53 year old male with no known history of coronary artery disease referred for chest pain.  EXAM: MYOCARDIAL IMAGING WITH SPECT (REST AND  PHARMACOLOGIC-STRESS)  GATED LEFT VENTRICULAR WALL MOTION STUDY  LEFT VENTRICULAR EJECTION FRACTION  TECHNIQUE: Standard myocardial SPECT imaging was performed after resting intravenous injection of 10 mCi Tc-23m sestamibi. Subsequently, intravenous infusion of Lexiscan was performed under the supervision of the Cardiology staff. At peak effect of the drug, 30 mCi Tc-25m sestamibi was injected intravenously and standard myocardial SPECT imaging was performed. Quantitative gated imaging was also performed to evaluate left ventricular wall motion, and estimate left ventricular ejection fraction.  COMPARISON:  None.  FINDINGS: Pharmacological stress  Baseline EKG shows normal sinus rhythm. After injection heart rate increased from 81 beats per min up to 97 beats per min, and blood pressure increased from 118/93 up to 130/75. The test was stopped after injection was complete, the patient did not experience any chest pain. Post-injection EKG showed no specific ischemic changes and no significant arrhythmias.  Perfusion: There is a moderate-sized mild intensity inferior wall defect seen in the resting images. The post-injection images show a similar less intense inferior wall defect. The inferior wall has normal wall motion, overall findings are consistent with sub- diaphragmatic attenuation. There are no other perfusion  defects.  Wall Motion: Normal left ventricular wall motion. No left ventricular dilation.  Left Ventricular Ejection Fraction: 48 %  End diastolic volume 568 ml  End systolic volume 59 ml  IMPRESSION: 1. No reversible ischemia or infarction.  2.  Normal left ventricular wall motion.  3. Left ventricular ejection fraction 48%  4. Low-risk stress test findings*.  *2012 Appropriate Use Criteria for Coronary Revascularization Focused Update: J Am Coll Cardiol. 1275;17(0):017-494. http://content.airportbarriers.com.aspx?articleid=1201161   Electronically Signed   By: Carlyle Dolly   On: 05/18/2014 11:52    Dg Chest Portable 1 View  05/17/2014   CLINICAL DATA:  Chest pain over the past several months but worsening in the last 24 hr. Tobacco use. Initial encounter.  EXAM: PORTABLE CHEST - 1 VIEW  COMPARISON:  07/24/2012  FINDINGS: Atherosclerotic aortic arch. Heart size within normal limits. The lungs appear clear. No pleural effusion. Otherwise negative exam.  IMPRESSION: 1. Aortic arch atherosclerotic calcification, chronic. No acute findings.   Electronically Signed   By: Sherryl Barters M.D.   On: 05/17/2014 09:35    Microbiology: No results found for this or any previous visit (from the past 240 hour(s)).   Labs: Basic Metabolic Panel:  Recent Labs Lab 05/17/14 0908 05/17/14 1453 05/18/14 0501  NA 137  --  139  K 3.4*  --  3.6*  CL 97  --  98  CO2 27  --  28  GLUCOSE 92  --  118*  BUN 11  --  15  CREATININE 1.22 1.35 1.47*  CALCIUM 9.4  --  9.0   Liver Function Tests: No results found for this basename: AST, ALT, ALKPHOS, BILITOT, PROT, ALBUMIN,  in the last 168 hours No results found for this basename: LIPASE, AMYLASE,  in the last 168 hours No results found for this basename: AMMONIA,  in the last 168 hours CBC:  Recent Labs Lab 05/17/14 0908 05/17/14 1453 05/18/14 0501  WBC 8.3 8.0 8.0  HGB 16.3 16.5 16.0  HCT 44.0 44.4 43.5  MCV 88.9 89.0 89.9  PLT 225 209 238   Cardiac Enzymes:  Recent Labs Lab 05/17/14 0908 05/17/14 1149 05/17/14 1453 05/17/14 1740 05/17/14 2033  TROPONINI <0.30 <0.30 <0.30 <0.30 <0.30   BNP: BNP (last 3 results) No results found for this basename: PROBNP,  in the last 8760 hours CBG: No results found for this basename: GLUCAP,  in the last 168 hours     Signed:  Radene Gunning  [facility] 05/18/2014, 1:10 PM

## 2014-05-18 NOTE — Progress Notes (Addendum)
Consulting cardiologist: Rozann Lesches MD Primary Cardiologist: Rozann Lesches MD  Subjective:    Continues to have chest pain, especially with inspiration.  Objective:   Temp:  [97.8 F (36.6 C)-98.2 F (36.8 C)] 97.8 F (36.6 C) (10/23 0542) Pulse Rate:  [72-89] 73 (10/23 0542) Resp:  [18] 18 (10/23 0542) BP: (94-132)/(67-95) 95/67 mmHg (10/23 0542) SpO2:  [93 %-97 %] 93 % (10/23 0542) Weight:  [246 lb (111.585 kg)] 246 lb (111.585 kg) (10/22 1413) Last BM Date: 05/17/14  Filed Weights   05/17/14 0905 05/17/14 1413  Weight: 246 lb (111.585 kg) 246 lb (111.585 kg)    Intake/Output Summary (Last 24 hours) at 05/18/14 0914 Last data filed at 05/18/14 0543  Gross per 24 hour  Intake 2068.59 ml  Output   1100 ml  Net 968.59 ml    Telemetry: NSR  Exam:  General: No acute distress.  HEENT: Conjunctiva and lids normal, oropharynx clear.  Lungs: Clear to auscultation, nonlabored.  Cardiac: No elevated JVP or bruits. RRR, no gallop or rub.   Abdomen: Normoactive bowel sounds, nontender, nondistended.  Extremities: No pitting edema, distal pulses full. Left leg psoriatic rash .  Neuropsychiatric: Alert and oriented x3, affect appropriate.   Lab Results:  Basic Metabolic Panel:  Recent Labs Lab 05/17/14 0908 05/17/14 1453 05/18/14 0501  NA 137  --  139  K 3.4*  --  3.6*  CL 97  --  98  CO2 27  --  28  GLUCOSE 92  --  118*  BUN 11  --  15  CREATININE 1.22 1.35 1.47*  CALCIUM 9.4  --  9.0    Liver Function Tests: No results found for this basename: AST, ALT, ALKPHOS, BILITOT, PROT, ALBUMIN,  in the last 168 hours  CBC:  Recent Labs Lab 05/17/14 0908 05/17/14 1453 05/18/14 0501  WBC 8.3 8.0 8.0  HGB 16.3 16.5 16.0  HCT 44.0 44.4 43.5  MCV 88.9 89.0 89.9  PLT 225 209 238    Cardiac Enzymes:  Recent Labs Lab 05/17/14 1453 05/17/14 1740 05/17/14 2033  TROPONINI <0.30 <0.30 <0.30    Radiology: Dg Chest Portable 1  View  05/17/2014   CLINICAL DATA:  Chest pain over the past several months but worsening in the last 24 hr. Tobacco use. Initial encounter.  EXAM: PORTABLE CHEST - 1 VIEW  COMPARISON:  07/24/2012  FINDINGS: Atherosclerotic aortic arch. Heart size within normal limits. The lungs appear clear. No pleural effusion. Otherwise negative exam.  IMPRESSION: 1. Aortic arch atherosclerotic calcification, chronic. No acute findings.   Electronically Signed   By: Sherryl Barters M.D.   On: 05/17/2014 09:35     Medications:   Scheduled Medications: . aspirin  325 mg Oral Daily  . enoxaparin (LOVENOX) injection  40 mg Subcutaneous Q24H  . pantoprazole  40 mg Oral Daily  . traZODone  50 mg Oral QHS  . triamterene-hydrochlorothiazide  1 tablet Oral Daily    Infusions: . sodium chloride 75 mL/hr at 05/17/14 1509    PRN Medications: acetaminophen, gi cocktail, morphine injection, nitroGLYCERIN, ondansetron (ZOFRAN) IV, sodium chloride, technetium sestamibi generic   Assessment and Plan:   1. Chest Pain: Continues to have chest discomfort with inspiration. None at rest. Patient assessed prior to [facility] stress test. Troponin negative X 2.   2. Hypertension: Some evidence of hypotension overnight, but averaging 092-330 systolic. Continues on maxide.  3. DDD: Chronic back pain.   4. Hyperlipidemia: Checking status.    Curt Bears  Brooks Sailors NP  05/18/2014, 9:14 AM  Attending Note Patient seen and discussed with NP Purcell Nails. Presented with mixed description for cardiac chest pain, no evidence for ACS though his initial EKG showed non-specific ST changes, serial EKGs did not repeat these findings or show any progression. Enzymes negative. Stress test negative. Chest pain primarily positional/pleuritic this AM most suggestive of MSK or pleuritic process. No further cardiac testing planned at this time. AKI this morning, would hold maxide for now, consider norvasc is alternative HTN agent needed. Will  write to stop maxide. No further cardiac testing planned at this time, will sign off of inpatient care. Patient may follow up with [place] in 3 weeks as outpatient.   Zandra Abts MD

## 2014-05-18 NOTE — Care Management Note (Signed)
    Page 1 of 1   05/18/2014     10:49:05 AM CARE MANAGEMENT NOTE 05/18/2014  Patient:  Calvin Aguirre, Calvin Aguirre   Account Number:  [id number]  Date Initiated:  05/18/2014  Documentation initiated by:  Theophilus Kinds  Subjective/Objective Assessment:   Pt admitted from home with CP. Pt lives with his sister and will return home at discharge. Pt is independent with ADL's.     Action/Plan:   No CM needs noted. Anticipate discharge today if stress test negative.   Anticipated DC Date:  05/18/2014   Anticipated DC Plan:  [place]  CM consult      Choice offered to / List presented to:             Status of service:  Completed, signed off Medicare Important Message given?   (If response is "NO", the following Medicare IM given date fields will be blank) Date Medicare IM given:   Medicare IM given by:   Date Additional Medicare IM given:   Additional Medicare IM given by:    Discharge Disposition:  HOME/SELF CARE  Per UR Regulation:    If discussed at Long Length of Stay Meetings, dates discussed:    Comments:  05/18/14 [place], RN BSN CM

## 2014-05-18 NOTE — Discharge Summary (Signed)
Patient seen and examined. Agree with note by Dyanne Carrel, NP. Patient's stress test was negative. Per cards, no further cardiac testing. OK to DC home.  Domingo Mend, MD [facility] Pager: [phone]

## 2014-06-18 ENCOUNTER — Telehealth: Payer: Self-pay | Admitting: Family Medicine

## 2014-06-18 NOTE — Telephone Encounter (Signed)
Patient currently has medicaid- he is currently taking prilosec, ibuprofen, flexeril, triamterene/hctz 37.5/25, fish oil. Appointment given for 09/19/2014 with Sabra Heck.

## 2014-08-02 ENCOUNTER — Telehealth: Payer: Self-pay | Admitting: Family Medicine

## 2014-08-02 NOTE — Telephone Encounter (Signed)
Pt given appt with Dietrich Pates tomorrow at 8:15.

## 2014-08-03 ENCOUNTER — Ambulatory Visit (INDEPENDENT_AMBULATORY_CARE_PROVIDER_SITE_OTHER): Payer: Medicaid Other

## 2014-08-03 ENCOUNTER — Encounter: Payer: Self-pay | Admitting: Family Medicine

## 2014-08-03 ENCOUNTER — Ambulatory Visit (INDEPENDENT_AMBULATORY_CARE_PROVIDER_SITE_OTHER): Payer: Medicaid Other | Admitting: Family Medicine

## 2014-08-03 VITALS — BP 125/88 | HR 107 | Temp 97.4°F | Ht 75.0 in | Wt 243.0 lb

## 2014-08-03 DIAGNOSIS — M79642 Pain in left hand: Secondary | ICD-10-CM

## 2014-08-03 DIAGNOSIS — S62309G Unspecified fracture of unspecified metacarpal bone, subsequent encounter for fracture with delayed healing: Secondary | ICD-10-CM

## 2014-08-03 MED ORDER — HYDROCODONE-ACETAMINOPHEN 5-325 MG PO TABS: 1.0000 | ORAL_TABLET | Freq: Four times a day (QID) | ORAL | Status: DC | PRN

## 2014-08-03 NOTE — Progress Notes (Signed)
   Subjective:    Patient ID: Calvin Aguirre, male    DOB: 1960/10/27, 54 y.o.   MRN: [id number]  HPI Patient injured left hand a few weeks ago.  Review of Systems  Constitutional: Negative for fever.  HENT: Negative for ear pain.   Eyes: Negative for discharge.  Respiratory: Negative for cough.   Cardiovascular: Negative for chest pain.  Gastrointestinal: Negative for abdominal distention.  Endocrine: Negative for polyuria.  Genitourinary: Negative for difficulty urinating.  Musculoskeletal: Negative for gait problem and neck pain.  Skin: Negative for color change and rash.  Neurological: Negative for speech difficulty and headaches.  Psychiatric/Behavioral: Negative for agitation.       Objective:    BP 125/88 mmHg  Pulse 107  Temp(Src) 97.4 F (36.3 C) (Oral)  Ht 6\' 3"  (1.905 m)  Wt 243 lb (110.224 kg)  BMI 30.37 kg/m2 Physical Exam  Constitutional: He is oriented to person, place, and time. He appears well-developed and well-nourished.  HENT:  Head: Normocephalic and atraumatic.  Mouth/Throat: Oropharynx is clear and moist.  Eyes: Pupils are equal, round, and reactive to light.  Neck: Normal range of motion. Neck supple.  Cardiovascular: Normal rate and regular rhythm.   No murmur heard. Pulmonary/Chest: Effort normal and breath sounds normal.  Abdominal: Soft. Bowel sounds are normal. There is no tenderness.  Neurological: He is alert and oriented to person, place, and time.  Skin: Skin is warm and dry.  Psychiatric: He has a normal mood and affect.     Xray left hand - Left 5th metacarpal with fracture Prelimnary reading by Iverson Alamin     Assessment & Plan:     ICD-9-CM ICD-10-CM   1. Hand pain, left 729.5 M79.642 DG Hand Complete Left     AMB referral to orthopedics     HYDROcodone-acetaminophen ([facility]) 5-325 MG per tablet  2. Boxer's fracture, with delayed healing, subsequent encounter V54.12 S62.309G AMB referral to orthopedics   HYDROcodone-acetaminophen ([place]) 5-325 MG per tablet     No Follow-up on file.  Lysbeth Penner FNP

## 2014-08-13 ENCOUNTER — Encounter: Payer: Self-pay | Admitting: *Deleted

## 2014-09-19 ENCOUNTER — Ambulatory Visit: Payer: Self-pay | Admitting: Family Medicine

## 2014-09-21 ENCOUNTER — Ambulatory Visit: Payer: Medicaid Other | Admitting: Family Medicine

## 2014-09-21 ENCOUNTER — Telehealth: Payer: Self-pay | Admitting: Family Medicine

## 2014-10-02 NOTE — Telephone Encounter (Signed)
A woman by the name of Michail Sermon answered the phone and said I had the wrong number. Will close encounter.

## 2014-11-05 ENCOUNTER — Ambulatory Visit (INDEPENDENT_AMBULATORY_CARE_PROVIDER_SITE_OTHER): Payer: Medicaid Other | Admitting: Family Medicine

## 2014-11-05 ENCOUNTER — Encounter: Payer: Self-pay | Admitting: Family Medicine

## 2014-11-05 ENCOUNTER — Encounter (INDEPENDENT_AMBULATORY_CARE_PROVIDER_SITE_OTHER): Payer: Self-pay

## 2014-11-05 VITALS — BP 131/90 | HR 114 | Temp 97.5°F | Ht 75.0 in | Wt 235.2 lb

## 2014-11-05 DIAGNOSIS — I471 Supraventricular tachycardia: Secondary | ICD-10-CM | POA: Diagnosis not present

## 2014-11-05 DIAGNOSIS — R002 Palpitations: Secondary | ICD-10-CM | POA: Diagnosis not present

## 2014-11-05 DIAGNOSIS — F411 Generalized anxiety disorder: Secondary | ICD-10-CM

## 2014-11-05 DIAGNOSIS — R000 Tachycardia, unspecified: Secondary | ICD-10-CM | POA: Insufficient documentation

## 2014-11-05 DIAGNOSIS — R252 Cramp and spasm: Secondary | ICD-10-CM | POA: Diagnosis not present

## 2014-11-05 DIAGNOSIS — G4700 Insomnia, unspecified: Secondary | ICD-10-CM

## 2014-11-05 DIAGNOSIS — M545 Low back pain, unspecified: Secondary | ICD-10-CM

## 2014-11-05 LAB — POCT CBC
GRANULOCYTE PERCENT: 68.6 % (ref 37–80)
HCT, POC: 60.2 % — AB (ref 43.5–53.7)
HEMOGLOBIN: 19.4 g/dL — AB (ref 14.1–18.1)
Lymph, poc: 3.3 (ref 0.6–3.4)
MCH, POC: 30 pg (ref 27–31.2)
MCHC: 32.2 g/dL (ref 31.8–35.4)
MCV: 93.2 fL (ref 80–97)
MPV: 8.4 fL (ref 0–99.8)
POC GRANULOCYTE: 8.3 — AB (ref 2–6.9)
POC LYMPH PERCENT: 27.4 %L (ref 10–50)
Platelet Count, POC: 300 10*3/uL (ref 142–424)
RBC: 6.46 M/uL — AB (ref 4.69–6.13)
RDW, POC: 13.8 %
WBC: 12.1 10*3/uL — AB (ref 4.6–10.2)

## 2014-11-05 MED ORDER — ZOLPIDEM TARTRATE 10 MG PO TABS: 10.0000 mg | ORAL_TABLET | Freq: Every evening | ORAL | Status: DC | PRN

## 2014-11-05 MED ORDER — OMEPRAZOLE 20 MG PO CPDR: 20.0000 mg | DELAYED_RELEASE_CAPSULE | Freq: Every day | ORAL | Status: DC

## 2014-11-05 MED ORDER — CYCLOBENZAPRINE HCL 10 MG PO TABS: 10.0000 mg | ORAL_TABLET | Freq: Two times a day (BID) | ORAL | Status: DC | PRN

## 2014-11-05 MED ORDER — TRAZODONE HCL 50 MG PO TABS: 300.0000 mg | ORAL_TABLET | Freq: Every day | ORAL | Status: DC

## 2014-11-05 MED ORDER — ALBUTEROL SULFATE HFA 108 (90 BASE) MCG/ACT IN AERS: 2.0000 | INHALATION_SPRAY | RESPIRATORY_TRACT | Status: DC | PRN
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
## 2014-11-08 ENCOUNTER — Telehealth: Payer: Self-pay | Admitting: Family Medicine

## 2014-11-08 LAB — CMP14+EGFR
ALT: 17 IU/L (ref 0–44)
AST: 16 IU/L (ref 0–40)
Albumin/Globulin Ratio: 1.9 (ref 1.1–2.5)
Albumin: 5 g/dL (ref 3.5–5.5)
Alkaline Phosphatase: 70 IU/L (ref 39–117)
BUN/Creatinine Ratio: 10 (ref 9–20)
BUN: 14 mg/dL (ref 6–24)
Bilirubin Total: 0.7 mg/dL (ref 0.0–1.2)
CO2: 25 mmol/L (ref 18–29)
CREATININE: 1.37 mg/dL — AB (ref 0.76–1.27)
Calcium: 10.2 mg/dL (ref 8.7–10.2)
Chloride: 94 mmol/L — ABNORMAL LOW (ref 97–108)
GFR calc Af Amer: 67 mL/min/{1.73_m2} (ref >59)
GFR calc non Af Amer: 58 mL/min/{1.73_m2} — ABNORMAL LOW (ref >59)
Globulin, Total: 2.6 g/dL (ref 1.5–4.5)
Glucose: 100 mg/dL — ABNORMAL HIGH (ref 65–99)
POTASSIUM: 3.6 mmol/L (ref 3.5–5.2)
SODIUM: 139 mmol/L (ref 134–144)
TOTAL PROTEIN: 7.6 g/dL (ref 6.0–8.5)

## 2014-11-08 LAB — MAGNESIUM: Magnesium: 2 mg/dL (ref 1.6–2.3)

## 2014-11-08 LAB — ZINC: Zinc: 91 ug/dL (ref 56–134)

## 2014-11-08 LAB — PHOSPHORUS: Phosphorus: 4.2 mg/dL (ref 2.5–4.5)

## 2014-11-09 NOTE — Telephone Encounter (Signed)
Aware of results. 

## 2014-12-07 ENCOUNTER — Ambulatory Visit (INDEPENDENT_AMBULATORY_CARE_PROVIDER_SITE_OTHER): Payer: Medicaid Other | Admitting: Family Medicine

## 2014-12-07 VITALS — BP 118/83 | HR 85 | Temp 97.0°F | Ht 75.0 in | Wt 236.0 lb

## 2014-12-07 DIAGNOSIS — M542 Cervicalgia: Secondary | ICD-10-CM | POA: Diagnosis not present

## 2014-12-07 DIAGNOSIS — D582 Other hemoglobinopathies: Secondary | ICD-10-CM

## 2014-12-07 LAB — POCT CBC
Granulocyte percent: 66.3 %G (ref 37–80)
HCT, POC: 55 % — AB (ref 43.5–53.7)
HEMOGLOBIN: 16.9 g/dL (ref 14.1–18.1)
Lymph, poc: 2.5 (ref 0.6–3.4)
MCH: 29 pg (ref 27–31.2)
MCHC: 30.7 g/dL — AB (ref 31.8–35.4)
MCV: 94.3 fL (ref 80–97)
MPV: 9.4 fL (ref 0–99.8)
POC Granulocyte: 5.6 (ref 2–6.9)
POC LYMPH %: 29.4 % (ref 10–50)
Platelet Count, POC: 228 10*3/uL (ref 142–424)
RBC: 5.83 M/uL (ref 4.69–6.13)
RDW, POC: 14.4 %
WBC: 8.5 10*3/uL (ref 4.6–10.2)

## 2014-12-07 MED ORDER — ZOLPIDEM TARTRATE 10 MG PO TABS: 10.0000 mg | ORAL_TABLET | Freq: Every evening | ORAL | Status: DC | PRN

## 2014-12-07 MED ORDER — CYCLOBENZAPRINE HCL 10 MG PO TABS: 10.0000 mg | ORAL_TABLET | Freq: Three times a day (TID) | ORAL | Status: DC

## 2014-12-07 MED ORDER — PREDNISONE 10 MG PO TABS: ORAL_TABLET | ORAL | Status: DC

## 2014-12-07 MED ORDER — TRAMADOL HCL 50 MG PO TABS: 50.0000 mg | ORAL_TABLET | Freq: Four times a day (QID) | ORAL | Status: DC | PRN
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
## 2015-01-07 ENCOUNTER — Ambulatory Visit: Payer: Medicaid Other | Admitting: Physician Assistant

## 2015-01-08 ENCOUNTER — Encounter: Payer: Self-pay | Admitting: Physician Assistant

## 2015-01-08 ENCOUNTER — Ambulatory Visit (INDEPENDENT_AMBULATORY_CARE_PROVIDER_SITE_OTHER): Payer: Medicaid Other | Admitting: Physician Assistant

## 2015-01-08 VITALS — BP 119/72 | HR 60 | Temp 97.4°F | Ht 75.0 in | Wt 233.0 lb

## 2015-01-08 DIAGNOSIS — X32XXXA Exposure to sunlight, initial encounter: Principal | ICD-10-CM

## 2015-01-08 DIAGNOSIS — L821 Other seborrheic keratosis: Secondary | ICD-10-CM

## 2015-01-08 DIAGNOSIS — L570 Actinic keratosis: Secondary | ICD-10-CM | POA: Diagnosis not present

## 2015-01-08 NOTE — Patient Instructions (Signed)
Actinic Keratosis Actinic keratosis is a precancerous growth on the skin. This means it could develop into skin cancer if it is not treated. About 1% of actinic keratoses turn into skin cancer within a year. It is important to have all such growths removed to prevent them from developing into skin cancer. CAUSES  Actinic keratosis is caused by getting too much ultraviolet (UV) radiation from the sun or other UV light sources. RISK FACTORS Factors that increase your chances of getting actinic keratosis include:  Having light-colored skin and blue eyes.  Having blonde or red hair.  Spending a lot of time in the sun.  Age. The risk of actinic keratosis increases with age. SYMPTOMS  Actinic keratosis growths look like scaly, rough spots of skin. They can be as small as a pinhead or as big as a quarter. They may itch, hurt, or feel sensitive. Sometimes there is a little tag of pink or gray skin growing off them. In some cases, actinic keratoses are easier felt than seen. They do not go away with the use of moisturizing lotions or creams. Actinic keratoses appear most often on areas of skin that get a lot of sun exposure. These areas include the:  Scalp.  Face.  Ears.  Lips.  Upper back.  Backs of the hands.  Forearms. DIAGNOSIS  Your health care provider can usually tell what is wrong by performing a physical exam. A tissue sample (biopsy) may also be taken and examined under a microscope. TREATMENT  Actinic keratosis can be treated several ways. Most treatments can be done in your health care provider's office. Treatment options may include:  Curettage. A tool is used to gently scrape off the growth.  Cryosurgery. Liquid nitrogen is applied to the growth to freeze it. The growth eventually falls off the skin.  Medicated creams, such as 5-fluorouracil or imiquimod. The medicine destroys the cells in the growth.  Chemical peels. Chemicals are applied to the growth and the outer  layers of skin are peeled off.  Photodynamic therapy. A drug that makes your skin more sensitive to light is applied to the skin. A strong, blue light is aimed at the skin and destroys the growth. PREVENTION  To prevent future sun damage:  Try to avoid the sun between 10:00 a.m. and 4:00 p.m. when it is the strongest.  Use a sunscreen or sunblock with SPF 30 or greater.  Apply sunscreen at least 30 minutes before exposure to the sun.  Always wear protective hats, clothing, and sunglasses with UV protection.  Avoid medicines, herbs, and foods that increase your sensitivity to sunlight.  Avoid tanning beds. HOME CARE INSTRUCTIONS   If your skin was covered with a bandage, change and remove the bandage as directed by your health care provider.  Keep the treated area dry as directed by your health care provider.  Apply any creams as prescribed by your health care provider. Follow the directions carefully.  Check your skin regularly for any changes.  Visit a skin doctor (dermatologist) every year for a skin exam. SEEK MEDICAL CARE IF:   Your skin does not heal and becomes irritated, red, or bleeds.  You notice any changes or new growths on your skin. Document Released: 10/09/2008 Document Revised: 11/27/2013 Document Reviewed: 08/24/2011 [facility] Patient Information 2015 [place], [place]. This information is not intended to replace advice given to you by your health care provider. Make sure you discuss any questions you have with your health care provider.

## 2015-01-08 NOTE — Progress Notes (Signed)
   Subjective:    Patient ID: Calvin Aguirre, male    DOB: Nov 17, 1960, 54 y.o.   MRN: [id number]  HPI 54 y/o male presents for suspicious lesion on back. He went to a Dermatologist 4 years ago. Was told that he had precancerous lesions. WAs supposed to go back but they do not take his insurance. He would like the place checked again. No known h/o melanoma or BCCl/SCC.     Review of Systems  Skin:       Enlarging lesion on right arm.  H/o precancerous areas on BUE and back        Objective:   Physical Exam  Skin:  Numerous areas of actinic changes on back and BUE with scaling and hyperkeratosis. No lesions suspicious for BCC/SCC or atypia at this time.  Benign seborrheic keratosis on back and BUE. Lentigo           Assessment & Plan:  1. 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 Advised patient to follow up on a yearly basis for monitoring. If lesions recur, advised him to follow up in clinic for biopsy to r/o BCc/SCC.    RTO 1 year  Taffy Delconte A. Benjamin Stain PA-C

## 2015-02-26 ENCOUNTER — Other Ambulatory Visit: Payer: Self-pay | Admitting: Family Medicine

## 2015-02-26 NOTE — Telephone Encounter (Signed)
Last seen 01/08/15 Tiffany

## 2015-03-25 ENCOUNTER — Telehealth: Payer: Self-pay | Admitting: Family Medicine

## 2015-03-25 NOTE — Telephone Encounter (Signed)
Patient would like orders for a bedside toilet and a shower chair due to his back pain and difficulty with mobility.  Advised that he would need to be seen to have these ordered. Appt scheduled for 03/29/15. Patient was agreeable to plan.

## 2015-03-26 ENCOUNTER — Emergency Department (HOSPITAL_COMMUNITY): Payer: Medicaid Other

## 2015-03-26 ENCOUNTER — Encounter (HOSPITAL_COMMUNITY): Payer: Self-pay | Admitting: *Deleted

## 2015-03-26 ENCOUNTER — Emergency Department (HOSPITAL_COMMUNITY)
Admission: EM | Admit: 2015-03-26 | Discharge: 2015-03-26 | Disposition: A | Discharge status: Home / Self Care | Payer: Medicaid Other | Attending: Emergency Medicine | Admitting: Emergency Medicine

## 2015-03-26 DIAGNOSIS — Z79899 Other long term (current) drug therapy: Secondary | ICD-10-CM | POA: Insufficient documentation

## 2015-03-26 DIAGNOSIS — Z85828 Personal history of other malignant neoplasm of skin: Secondary | ICD-10-CM | POA: Insufficient documentation

## 2015-03-26 DIAGNOSIS — Z8673 Personal history of transient ischemic attack (TIA), and cerebral infarction without residual deficits: Secondary | ICD-10-CM | POA: Insufficient documentation

## 2015-03-26 DIAGNOSIS — Z8639 Personal history of other endocrine, nutritional and metabolic disease: Secondary | ICD-10-CM | POA: Insufficient documentation

## 2015-03-26 DIAGNOSIS — M549 Dorsalgia, unspecified: Secondary | ICD-10-CM | POA: Diagnosis present

## 2015-03-26 DIAGNOSIS — Z8619 Personal history of other infectious and parasitic diseases: Secondary | ICD-10-CM | POA: Diagnosis not present

## 2015-03-26 DIAGNOSIS — Z720 Tobacco use: Secondary | ICD-10-CM | POA: Diagnosis not present

## 2015-03-26 DIAGNOSIS — K219 Gastro-esophageal reflux disease without esophagitis: Secondary | ICD-10-CM | POA: Diagnosis not present

## 2015-03-26 DIAGNOSIS — I10 Essential (primary) hypertension: Secondary | ICD-10-CM | POA: Diagnosis not present

## 2015-03-26 DIAGNOSIS — M5136 Other intervertebral disc degeneration, lumbar region: Secondary | ICD-10-CM | POA: Diagnosis not present

## 2015-03-26 DIAGNOSIS — Z7982 Long term (current) use of aspirin: Secondary | ICD-10-CM | POA: Insufficient documentation

## 2015-03-26 DIAGNOSIS — G4700 Insomnia, unspecified: Secondary | ICD-10-CM | POA: Diagnosis not present

## 2015-03-26 MED ORDER — HYDROMORPHONE HCL 1 MG/ML IJ SOLN
1.0000 mg | Freq: Once | INTRAMUSCULAR | Status: AC
  Administered 2015-03-26: 1 mg via INTRAMUSCULAR
  Filled 2015-03-26: qty 1

## 2015-03-26 MED ORDER — PREDNISONE 50 MG PO TABS
60.0000 mg | ORAL_TABLET | Freq: Once | ORAL | Status: AC
  Administered 2015-03-26: 60 mg via ORAL
  Filled 2015-03-26 (×2): qty 1

## 2015-03-26 MED ORDER — ONDANSETRON 8 MG PO TBDP
8.0000 mg | ORAL_TABLET | Freq: Once | ORAL | Status: AC
  Administered 2015-03-26: 8 mg via ORAL
  Filled 2015-03-26: qty 1

## 2015-03-26 MED ORDER — HYDROMORPHONE HCL 2 MG/ML IJ SOLN
2.0000 mg | Freq: Once | INTRAMUSCULAR | Status: AC
  Administered 2015-03-26: 2 mg via INTRAMUSCULAR
  Filled 2015-03-26: qty 1

## 2015-03-26 MED ORDER — PREDNISONE 20 MG PO TABS: ORAL_TABLET | ORAL | Status: DC
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
## 2015-03-26 NOTE — Discharge Instructions (Signed)
Start the prednisone prescription, tomorrow morning. Use heat on the sore area 3 or 4 times a day. Follow-up with your neurosurgeon as soon as possible for further evaluation and treatment. The results from your MRI, are listed below.   Degenerative Disk Disease Degenerative disk disease is a condition caused by the changes that occur in the cushions of the backbone (spinal disks) as you grow older. Spinal disks are soft and compressible disks located between the bones of the spine (vertebrae). They act like shock absorbers. Degenerative disk disease can affect the whole spine. However, the neck and lower back are most commonly affected. Many changes can occur in the spinal disks with aging, such as:  The spinal disks may dry and shrink.  Small tears may occur in the tough, outer covering of the disk (annulus).  The disk space may become smaller due to loss of water.  Abnormal growths in the bone (spurs) may occur. This can put pressure on the nerve roots exiting the spinal canal, causing pain.  The spinal canal may become narrowed. CAUSES  Degenerative disk disease is a condition caused by the changes that occur in the spinal disks with aging. The exact cause is not known, but there is a genetic basis for many patients. Degenerative changes can occur due to loss of fluid in the disk. This makes the disk thinner and reduces the space between the backbones. Small cracks can develop in the outer layer of the disk. This can lead to the breakdown of the disk. You are more likely to get degenerative disk disease if you are overweight. Smoking cigarettes and doing heavy work such as weightlifting can also increase your risk of this condition. Degenerative changes can start after a sudden injury. Growth of bone spurs can compress the nerve roots and cause pain.  SYMPTOMS  The symptoms vary from person to person. Some people may have no pain, while others have severe pain. The pain may be so severe that  it can limit your activities. The location of the pain depends on the part of your backbone that is affected. You will have neck or arm pain if a disk in the neck area is affected. You will have pain in your back, buttocks, or legs if a disk in the lower back is affected. The pain becomes worse while bending, reaching up, or with twisting movements. The pain may start gradually and then get worse as time passes. It may also start after a major or minor injury. You may feel numbness or tingling in the arms or legs.  DIAGNOSIS  Your caregiver will ask you about your symptoms and about activities or habits that may cause the pain. He or she may also ask about any injuries, diseases, or treatments you have had earlier. Your caregiver will examine you to check for the range of movement that is possible in the affected area, to check for strength in your extremities, and to check for sensation in the areas of the arms and legs supplied by different nerve roots. An X-ray of the spine may be taken. Your caregiver may suggest other imaging tests, such as magnetic resonance imaging (MRI), if needed.  TREATMENT  Treatment includes rest, modifying your activities, and applying ice and heat. Your caregiver may prescribe medicines to reduce your pain and may ask you to do some exercises to strengthen your back. In some cases, you may need surgery. You and your caregiver will decide on the treatment that is best for you.  HOME CARE INSTRUCTIONS   Follow proper lifting and walking techniques as advised by your caregiver.  Maintain good posture.  Exercise regularly as advised.  Perform relaxation exercises.  Change your sitting, standing, and sleeping habits as advised. Change positions frequently.  Lose weight as advised.  Stop smoking if you smoke.  Wear supportive footwear. SEEK MEDICAL CARE IF:  Your pain does not go away within 1 to 4 weeks. SEEK IMMEDIATE MEDICAL CARE IF:   Your pain is severe.  You  notice weakness in your arms, hands, or legs.  You begin to lose control of your bladder or bowel movements. MAKE SURE YOU:   Understand these instructions.  Will watch your condition.  Will get help right away if you are not doing well or get worse. Document Released: 05/10/2007 Document Revised: 10/05/2011 Document Reviewed: 11/14/2013 [facility] Patient Information 2015 [place], [place]. This information is not intended to replace advice given to you by your health care provider. Make sure you discuss any questions you have with your health care provider.  MRI Lumbar Results- 03/26/15:  IMPRESSION: When compared to prior examination, slight progression of degenerative changes at the L3-4 level. Summary of pertinent findings includes:  L3-4 mild to moderate bulge greater right paracentral position with mild impression right ventral thecal sac.  L4-5 minimal bulge. Mild to moderate facet joint degenerative changes.  L5-S1 prior left laminectomy. Broad-based disc osteophyte complex which centrally is greater right paracentral position touching but not compressing the origin of the right S1 nerve root. Foraminal/lateral extension of disc osteophyte greater on the left with slight encroachment upon but not compression of the exiting left L5 nerve root.

## 2015-03-26 NOTE — ED Notes (Signed)
Pt with lower back pain that is chronic but past 3 days after bending over pain has increased, per pt took Percocet early this morning which is prescribed by pain clinic

## 2015-03-29 ENCOUNTER — Encounter: Payer: Self-pay | Admitting: Family Medicine

## 2015-03-29 ENCOUNTER — Ambulatory Visit (INDEPENDENT_AMBULATORY_CARE_PROVIDER_SITE_OTHER): Payer: Medicaid Other | Admitting: Family Medicine

## 2015-03-29 ENCOUNTER — Encounter (INDEPENDENT_AMBULATORY_CARE_PROVIDER_SITE_OTHER): Payer: Self-pay

## 2015-03-29 VITALS — BP 113/72 | HR 68 | Temp 97.2°F | Ht 75.0 in | Wt 232.0 lb

## 2015-03-29 DIAGNOSIS — M5440 Lumbago with sciatica, unspecified side: Secondary | ICD-10-CM | POA: Diagnosis not present

## 2015-03-29 DIAGNOSIS — M545 Low back pain, unspecified: Secondary | ICD-10-CM | POA: Insufficient documentation
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
## 2015-04-18 ENCOUNTER — Ambulatory Visit: Payer: Medicaid Other | Admitting: Family Medicine

## 2015-04-19 ENCOUNTER — Encounter: Payer: Self-pay | Admitting: Family Medicine

## 2015-04-25 ENCOUNTER — Other Ambulatory Visit: Payer: Self-pay | Admitting: Family Medicine

## 2015-04-25 ENCOUNTER — Encounter: Payer: Self-pay | Admitting: Family Medicine

## 2015-04-25 ENCOUNTER — Ambulatory Visit (INDEPENDENT_AMBULATORY_CARE_PROVIDER_SITE_OTHER): Payer: Medicaid Other | Admitting: Family Medicine

## 2015-04-25 ENCOUNTER — Other Ambulatory Visit: Payer: Self-pay | Admitting: Physician Assistant

## 2015-04-25 VITALS — BP 141/85 | HR 71 | Temp 97.9°F | Ht 75.0 in | Wt 226.8 lb

## 2015-04-25 DIAGNOSIS — N529 Male erectile dysfunction, unspecified: Secondary | ICD-10-CM | POA: Insufficient documentation

## 2015-04-25 DIAGNOSIS — N528 Other male erectile dysfunction: Secondary | ICD-10-CM

## 2015-04-25 DIAGNOSIS — I10 Essential (primary) hypertension: Secondary | ICD-10-CM | POA: Diagnosis not present

## 2015-04-25 DIAGNOSIS — M5440 Lumbago with sciatica, unspecified side: Secondary | ICD-10-CM

## 2015-04-25 DIAGNOSIS — I6522 Occlusion and stenosis of left carotid artery: Secondary | ICD-10-CM

## 2015-04-25 MED ORDER — ATORVASTATIN CALCIUM 20 MG PO TABS: 20.0000 mg | ORAL_TABLET | Freq: Every day | ORAL | Status: DC

## 2015-04-25 MED ORDER — SILDENAFIL CITRATE 20 MG PO TABS: ORAL_TABLET | ORAL | Status: DC

## 2015-04-25 NOTE — Progress Notes (Signed)
   HPI  Patient presents today for follow-up carotid artery stenosis.  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 50% occlusion of his right internal carotid artery. He saw vascular surgery who recommended follow-up carotid ultrasound 6 months after follow-up, this was 2 years ago. He denies any additional TIA symptoms. He is compliant with aspirin.  Erectile dysfunction States that he has difficulty getting and maintaining an erection, request Viagra today.  No chest pain, dyspnea, palpitations, leg edema.  He has severe back pain and is seeing his surgeon tomorrow, he suspicious that he will have to have back surgery. He requests a shower stool as he has difficult time standing up, and sitting for long time, he has difficulty in the shower due to his severe back pain.  Smoking Current smoker, not interested in quitting  PMH: Smoking status noted ROS: Per HPI  Objective: BP 141/85 mmHg  Pulse 71  Temp(Src) 97.9 F (36.6 C) (Oral)  Ht $R'6\' 3"'jd$  (1.905 m)  Wt 226 lb 12.8 oz (102.876 kg)  BMI 28.35 kg/m2 Gen: NAD, alert, cooperative with exam HEENT: NCAT Neck: No carotid bruit CV: RRR, good S1/S2, no murmur Resp: CTABL, no wheezes, non-labored Ext: No edema, warm Neuro: Alert and oriented, No gross deficits  Assessment and plan:  # Carotid artery occlusion, stenosis Repeat carotid ultrasound Continue aspirin With previous TIA start statin  # Tobacco abuse Counseled, very important considering his previous TIA and carotid artery stenosis  # Hypertriglyceridemia Continue fish oil, lipid panel Starting statin for secondary prevention with previous TIA   #Erectile dysfunction Given sildenafil prescription, likely due to atherosclerosis  Orders Placed This Encounter  Procedures  . Shower chair  . US Carotid Duplex Bilateral    Standing  Status: Future     Number of Occurrences:      Standing Expiration Date: 06/24/2016    Order Specific Question:  Reason for exam:    Answer:  previous stenosis at 50%, complete occlusion, previous TIA    Order Specific Question:  Preferred imaging location?    Answer:  [facility]  . Lipid Panel  . CMP14+EGFR  . CBC    Meds ordered this encounter  Medications  . Fish Oil-Cholecalciferol (FISH OIL + D3 PO)    Sig: Take by mouth.  Marland Kitchen atorvastatin (LIPITOR) 20 MG tablet    Sig: Take 1 tablet (20 mg total) by mouth daily.    Dispense:  90 tablet    Refill:  3  . sildenafil (REVATIO) 20 MG tablet    Sig: 2-4 tablets as needed once daily    Dispense:  30 tablet    Refill:  [place], MD [facility] 04/25/2015, 10:31 AM

## 2015-04-25 NOTE — Patient Instructions (Signed)
   Great to meet you!  Come back in 3 months, we will repeat your labs  Start lipitor, continue aspirin  Please have Dr. Gladstone Lighter send [place] notes

## 2015-04-26 LAB — CMP14+EGFR
ALT: 17 IU/L (ref 0–44)
AST: 14 IU/L (ref 0–40)
Albumin/Globulin Ratio: 1.6 (ref 1.1–2.5)
Albumin: 4.3 g/dL (ref 3.5–5.5)
Alkaline Phosphatase: 59 IU/L (ref 39–117)
BUN/Creatinine Ratio: 12 (ref 9–20)
BUN: 13 mg/dL (ref 6–24)
Bilirubin Total: 0.4 mg/dL (ref 0.0–1.2)
CALCIUM: 9.6 mg/dL (ref 8.7–10.2)
CO2: 28 mmol/L (ref 18–29)
Chloride: 94 mmol/L — ABNORMAL LOW (ref 97–108)
Creatinine, Ser: 1.12 mg/dL (ref 0.76–1.27)
GFR, EST AFRICAN AMERICAN: 85 mL/min/{1.73_m2} (ref >59)
GFR, EST NON AFRICAN AMERICAN: 74 mL/min/{1.73_m2} (ref >59)
Globulin, Total: 2.7 g/dL (ref 1.5–4.5)
Glucose: 106 mg/dL — ABNORMAL HIGH (ref 65–99)
Potassium: 3.9 mmol/L (ref 3.5–5.2)
Sodium: 139 mmol/L (ref 134–144)
TOTAL PROTEIN: 7 g/dL (ref 6.0–8.5)

## 2015-04-26 LAB — LIPID PANEL
CHOL/HDL RATIO: 5.4 ratio — AB (ref 0.0–5.0)
Cholesterol, Total: 185 mg/dL (ref 100–199)
HDL: 34 mg/dL — ABNORMAL LOW (ref >39)
LDL Calculated: 84 mg/dL (ref 0–99)
Triglycerides: 334 mg/dL — ABNORMAL HIGH (ref 0–149)
VLDL Cholesterol Cal: 67 mg/dL — ABNORMAL HIGH (ref 5–40)

## 2015-04-26 LAB — CBC
HEMATOCRIT: 49.3 % (ref 37.5–51.0)
Hemoglobin: 17.4 g/dL (ref 12.6–17.7)
MCH: 32.8 pg (ref 26.6–33.0)
MCHC: 35.3 g/dL (ref 31.5–35.7)
MCV: 93 fL (ref 79–97)
Platelets: 302 10*3/uL (ref 150–379)
RBC: 5.31 x10E6/uL (ref 4.14–5.80)
RDW: 14 % (ref 12.3–15.4)
WBC: 9.3 10*3/uL (ref 3.4–10.8)

## 2015-04-29 NOTE — Telephone Encounter (Signed)
Last seen 04/25/15  Dr Wendi Snipes

## 2015-05-27 ENCOUNTER — Telehealth: Payer: Self-pay

## 2015-05-28 NOTE — Telephone Encounter (Signed)
x

## 2015-05-31 ENCOUNTER — Ambulatory Visit (HOSPITAL_COMMUNITY)
Admission: RE | Admit: 2015-05-31 | Discharge: 2015-05-31 | Disposition: A | Discharge status: Home / Self Care | Payer: Medicaid Other | Source: Ambulatory Visit | Attending: Family Medicine | Admitting: Family Medicine

## 2015-05-31 DIAGNOSIS — I6523 Occlusion and stenosis of bilateral carotid arteries: Secondary | ICD-10-CM | POA: Diagnosis present

## 2015-05-31 DIAGNOSIS — I6522 Occlusion and stenosis of left carotid artery: Secondary | ICD-10-CM

## 2015-06-04 ENCOUNTER — Other Ambulatory Visit: Payer: Self-pay | Admitting: *Deleted

## 2015-06-04 DIAGNOSIS — R9389 Abnormal findings on diagnostic imaging of other specified body structures: Secondary | ICD-10-CM

## 2015-06-07 ENCOUNTER — Ambulatory Visit (INDEPENDENT_AMBULATORY_CARE_PROVIDER_SITE_OTHER): Payer: Medicaid Other | Admitting: Pediatrics

## 2015-06-07 ENCOUNTER — Ambulatory Visit: Payer: Medicaid Other | Admitting: Family

## 2015-06-07 ENCOUNTER — Encounter: Payer: Self-pay | Admitting: Pediatrics

## 2015-06-07 VITALS — BP 140/92 | HR 73 | Temp 97.3°F | Ht 75.0 in | Wt 229.0 lb

## 2015-06-07 DIAGNOSIS — M5489 Other dorsalgia: Secondary | ICD-10-CM
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
## 2015-06-08 ENCOUNTER — Ambulatory Visit: Payer: Medicaid Other

## 2015-06-18 ENCOUNTER — Ambulatory Visit: Payer: Medicaid Other | Admitting: Family Medicine

## 2015-06-25 ENCOUNTER — Encounter: Payer: Self-pay | Admitting: Vascular Surgery

## 2015-06-27 ENCOUNTER — Ambulatory Visit: Payer: Medicaid Other | Admitting: Vascular Surgery

## 2015-06-27 ENCOUNTER — Other Ambulatory Visit: Payer: Self-pay | Admitting: Family Medicine

## 2015-06-28 NOTE — Telephone Encounter (Signed)
Forwarding to PCP, Dr. Livia Snellen

## 2015-06-28 NOTE — Telephone Encounter (Signed)
Last seen 06/07/15  Dr Evette Doffing

## 2015-07-01 ENCOUNTER — Encounter: Payer: Self-pay | Admitting: Family Medicine

## 2015-07-01 ENCOUNTER — Ambulatory Visit (INDEPENDENT_AMBULATORY_CARE_PROVIDER_SITE_OTHER): Payer: Medicaid Other | Admitting: Family Medicine

## 2015-07-01 ENCOUNTER — Ambulatory Visit (INDEPENDENT_AMBULATORY_CARE_PROVIDER_SITE_OTHER): Payer: Medicaid Other

## 2015-07-01 ENCOUNTER — Encounter (INDEPENDENT_AMBULATORY_CARE_PROVIDER_SITE_OTHER): Payer: Self-pay

## 2015-07-01 VITALS — BP 128/91 | HR 83 | Temp 97.5°F | Ht 75.0 in | Wt 233.6 lb

## 2015-07-01 DIAGNOSIS — M25562 Pain in left knee: Secondary | ICD-10-CM | POA: Diagnosis not present

## 2015-07-01 NOTE — Patient Instructions (Addendum)
Great to see you!  We will call with the results from the x ray.   I would be glad to give you a knee injection later this week if there are signs of arthritis.

## 2015-07-01 NOTE — Addendum Note (Signed)
Addended by: Timmothy Euler on: 07/01/2015 12:21 PM   Modules accepted: Miquel Dunn

## 2015-07-01 NOTE — Progress Notes (Addendum)
   HPI  Patient presents today to discuss knee pain and chronic pain.  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 He has a lot of stiffness after sitting for a few minutes. He has no redness, warmth, or swelling. He states that it was swollen intermittently a few years ago. He has tried tramadol for this pain with no improvement. He requests hydrocodone today.  He is awaiting a pain clinic clinic currently. He has failed a drug test in the past about 2 years ago, it came up positive for cocaine, he states that he has never used cocaine and does not use it now.  PMH: Smoking status noted ROS: Per HPI  Objective: BP 128/91 mmHg  Pulse 83  Temp(Src) 97.5 F (36.4 C) (Oral)  Ht 6\' 3"  (1.905 m)  Wt 233 lb 9.6 oz (105.96 kg)  BMI 29.20 kg/m2 Gen: NAD, alert, cooperative with exam HEENT: NCAT CV: RRR, good S1/S2, no murmur Resp: CTABL, no wheezes, non-labored Ext: No edema, warm Neuro: Alert and oriented, No gross deficits  MSK: L  knee without, effusion, or gross deformity No joint line tenderness.  ligamentously intact to Lachman's and with varus and valgus stress.  Negative McMurray's test   XR L knee No acute findings, mild sclerosis  But preserved joint space, no spurring.   Assessment and plan:  # Knee pain Unclear etiology, crepitus is very mild Last knee x-ray in 2014 was reasonable with no significant signs of osteoarthritis Repeat x-ray today Possibly bring back later in the week for knee injection I offered tramadol for pain which he declines, I do not feel comfortable prescribing hydrocodone given that he has failed a drug test in the past  Orders Placed This Encounter  Procedures  . DG Knee 1-2 Views Left    Standing Status: Future     Number of Occurrences:      Standing Expiration Date: 08/30/2016    Order Specific Question:  Reason for Exam (SYMPTOM  OR DIAGNOSIS REQUIRED)    Answer:  eval for OA    Order Specific Question:  Preferred imaging location?    Answer:  Internal    Laroy Apple, MD [facility] 07/01/2015, 9:13 AM

## 2015-07-02 ENCOUNTER — Other Ambulatory Visit: Payer: Self-pay

## 2015-07-02 NOTE — Telephone Encounter (Signed)
Please review and advise.

## 2015-07-02 NOTE — Telephone Encounter (Signed)
Patient last seen in office on 07-01-15. Rx last filled on 05-29-15 for #30. Please advise. If approved please route to Pool  B so nurse can phone in to pharmacy

## 2015-07-03 MED ORDER — ZOLPIDEM TARTRATE 10 MG PO TABS: 10.0000 mg | ORAL_TABLET | Freq: Every evening | ORAL | Status: DC | PRN

## 2015-07-03 NOTE — Telephone Encounter (Signed)
rx called into pharmacy

## 2015-07-09 ENCOUNTER — Ambulatory Visit: Payer: Medicaid Other | Admitting: Family Medicine

## 2015-07-25 ENCOUNTER — Encounter: Payer: Self-pay | Admitting: Vascular Surgery

## 2015-07-26 ENCOUNTER — Other Ambulatory Visit: Payer: Self-pay | Admitting: Family Medicine

## 2015-07-30 NOTE — Telephone Encounter (Signed)
Last seen 07/01/15  Dr Wendi Snipes

## 2015-08-01 ENCOUNTER — Ambulatory Visit: Payer: Medicaid Other | Admitting: Vascular Surgery

## 2015-08-02 ENCOUNTER — Encounter: Payer: Self-pay | Admitting: Vascular Surgery

## 2015-08-02 ENCOUNTER — Encounter: Payer: Self-pay | Admitting: Family Medicine

## 2015-08-02 ENCOUNTER — Ambulatory Visit (INDEPENDENT_AMBULATORY_CARE_PROVIDER_SITE_OTHER): Payer: Medicaid Other | Admitting: Family Medicine

## 2015-08-02 VITALS — BP 125/76 | HR 86 | Temp 97.2°F | Ht 75.0 in | Wt 241.0 lb

## 2015-08-02 DIAGNOSIS — M25562 Pain in left knee: Secondary | ICD-10-CM | POA: Diagnosis not present

## 2015-08-02 NOTE — Patient Instructions (Signed)
Great to see you!  We will work on a different ortho for your back pain.

## 2015-08-02 NOTE — Progress Notes (Signed)
   HPI  Patient presents today with knee pain requesting injection.  Patient explains that he's had bilateral knee pain for several years.  Right now his left knee is slightly worse in the right knee. He describes aching pain, medial knee pain, and locking symptoms. He denies any fever, chills, Ernest breath.  He's tried conservative therapy he is never had a knee injection, he has had several back injections.  PMH: Smoking status noted ROS: Per HPI  Objective: BP 125/76 mmHg  Pulse 86  Temp(Src) 97.2 F (36.2 C) (Oral)  Ht 6\' 3"  (1.905 m)  Wt 241 lb (109.317 kg)  BMI 30.12 kg/m2 Gen: NAD, alert, cooperative with exam HEENT: NCAT CV: RRR, good S1/S2, no murmur Resp: CTABL, no wheezes, non-labored Ext: No edema, warm Neuro: Alert and oriented, No gross deficits  MSK: L knee without erythema, effusion, bruising, or gross deformity Medial joint line tenderness.  ligamentously intact to Lachman's and with varus and valgus stress.  Negative McMurray's test  L knee injection Formed consent was performed and placed on the chart, the area was prepped with Betadine 2 and white clear with alcohol. Using a 21 and 1-1/2 inch needle 1 mL of 80 mg/mL Depo-Medrol and 4 mL 1% Xylocaine were injected into the left knee via medial approach. The patient tolerated the procedure well, there were no complications.    Assessment and plan:  # Knee pain, meniscal injury. Considering his locking symptoms and medial joint line tenderness having it's most likely he has a meniscus injury He would like a referral to ortho for back pain.  RTC if worsening or does not improve.     Calvin Apple, MD [facility] 08/02/2015, 9:18 AM

## 2015-08-07 ENCOUNTER — Ambulatory Visit (INDEPENDENT_AMBULATORY_CARE_PROVIDER_SITE_OTHER): Payer: Medicaid Other | Admitting: Vascular Surgery

## 2015-08-07 ENCOUNTER — Encounter: Payer: Self-pay | Admitting: Vascular Surgery

## 2015-08-07 VITALS — BP 119/85 | HR 104 | Temp 98.2°F | Resp 14 | Ht 75.0 in | Wt 235.0 lb

## 2015-08-07 DIAGNOSIS — I6521 Occlusion and stenosis of right carotid artery: Secondary | ICD-10-CM

## 2015-08-07 NOTE — Addendum Note (Signed)
Addended by: Dorthula Rue L on: 08/07/2015 03:09 PM   Modules accepted: Orders
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
## 2015-08-28 ENCOUNTER — Other Ambulatory Visit: Payer: Self-pay | Admitting: Family Medicine

## 2015-09-26 ENCOUNTER — Other Ambulatory Visit: Payer: Self-pay

## 2015-09-26 NOTE — Telephone Encounter (Signed)
Last seen 08/02/15  Dr Wendi Snipes  PCP  Dr Livia Snellen   If approved route to nurse to call into [place]  [phone]

## 2015-09-27 MED ORDER — ZOLPIDEM TARTRATE 10 MG PO TABS: 10.0000 mg | ORAL_TABLET | Freq: Every evening | ORAL | Status: DC | PRN

## 2015-09-27 NOTE — Telephone Encounter (Signed)
Please review and advise.

## 2015-09-27 NOTE — Telephone Encounter (Signed)
rx called in, pt aware rx sent and needs to make apt before next refill

## 2015-10-10 ENCOUNTER — Ambulatory Visit (INDEPENDENT_AMBULATORY_CARE_PROVIDER_SITE_OTHER): Payer: Medicaid Other | Admitting: Family Medicine

## 2015-10-10 ENCOUNTER — Encounter: Payer: Self-pay | Admitting: Family Medicine

## 2015-10-10 VITALS — BP 134/84 | HR 74 | Temp 97.4°F | Ht 75.0 in | Wt 236.6 lb

## 2015-10-10 DIAGNOSIS — R3989 Other symptoms and signs involving the genitourinary system: Secondary | ICD-10-CM

## 2015-10-10 DIAGNOSIS — I10 Essential (primary) hypertension: Secondary | ICD-10-CM | POA: Diagnosis not present

## 2015-10-10 DIAGNOSIS — R079 Chest pain, unspecified: Secondary | ICD-10-CM | POA: Diagnosis not present

## 2015-10-10 DIAGNOSIS — N50812 Left testicular pain: Secondary | ICD-10-CM | POA: Diagnosis not present

## 2015-10-10 DIAGNOSIS — N400 Enlarged prostate without lower urinary tract symptoms: Secondary | ICD-10-CM | POA: Diagnosis not present

## 2015-10-10 MED ORDER — TAMSULOSIN HCL 0.4 MG PO CAPS: 0.4000 mg | ORAL_CAPSULE | Freq: Every day | ORAL | Status: DC

## 2015-10-10 NOTE — Progress Notes (Signed)
   HPI  Patient presents today here for urinary symptoms, testicle pain, and chest pain  Chest pain happens intermittently at rest and with exertion described as a non radiating L sided chest pressure type pain less severe than pain that took him to the hopsital in oct 2015  Tetsicle pain Off and on for 5+ years, lasts 3-4 days at atime, L testicle  Urinary symps- Has nocturia, urinary hesitancy, weak streem, and dribbling States he had a PSA previously that wsa 10+, not worked up by urology [place] and keeps telling me he had a colonoscopy b/c of it  PMH: Smoking status noted ROS: Per HPI  Objective: BP 134/84 mmHg  Pulse 74  Temp(Src) 97.4 F (36.3 C) (Oral)  Ht 6\' 3"  (1.905 m)  Wt 236 lb 9.6 oz (107.321 kg)  BMI 29.57 kg/m2 Gen: NAD, alert, cooperative with exam HEENT: NCAT CV: RRR, good S1/S2, no murmur Resp: CTABL, no wheezes, non-labored Ext: No edema, warm Neuro: Alert and oriented, No gross deficits  Prostate- enlarged, R>L, no nodules, normal texture GU_ no testiclular abnormalities appreciated- normal texture, no lumps/bumps, no swelling or discoloration  EKG- NSR  Assessment and plan:  # Chest pain Typical and atypical features, EKG WNL Fasting labs this week (PSA today) Cardiology for considering stress  # BPH symptoms, possible prostate Ca With Hx of elevated PSA and abnormal exam will refer to urology PSA Flomax  # Testicular pain Normal exam, longstanding No acute pain With prostate abnorms I'm sending him to urology anyway so will defer [place] for now.      Orders Placed This Encounter  Procedures  . PSA  . Ambulatory referral to Urology    Referral Priority:  Routine    Referral Type:  Consultation    Referral Reason:  Specialty Services Required    Requested Specialty:  Urology    Number of Visits Requested:  [place], MD [place] 10/10/2015, 8:21 AM

## 2015-10-10 NOTE — Patient Instructions (Signed)
Great to see you!  Come back in 3 months for discussion about triglycerides, come fasting

## 2015-10-11 ENCOUNTER — Telehealth: Payer: Self-pay | Admitting: Family Medicine

## 2015-10-11 LAB — PSA: Prostate Specific Ag, Serum: 73.6 ng/mL — ABNORMAL HIGH (ref 0.0–4.0)

## 2015-10-11 NOTE — Telephone Encounter (Signed)
Called and discussed elevated PSA.   Laroy Apple, MD [facility] 10/11/2015, 8:15 AM

## 2015-10-16 ENCOUNTER — Telehealth: Payer: Self-pay

## 2015-10-16 NOTE — Telephone Encounter (Signed)
Patient said he has prostate cancer and just wants to go on to a cancer dr not a urologist

## 2015-10-16 NOTE — Telephone Encounter (Signed)
Called and discussed, recommend seeing urology first.   He now remembers having a prostate biopsy done but cant remember the doctors name .  He cant remember the results of the biopsy either.   Laroy Apple, MD [facility] 10/16/2015, 5:34 PM

## 2015-10-22 ENCOUNTER — Other Ambulatory Visit: Payer: Self-pay | Admitting: Urology

## 2015-10-22 DIAGNOSIS — C61 Malignant neoplasm of prostate: Secondary | ICD-10-CM

## 2015-10-25 ENCOUNTER — Other Ambulatory Visit: Payer: Self-pay | Admitting: Family Medicine

## 2015-10-26 ENCOUNTER — Other Ambulatory Visit: Payer: Self-pay | Admitting: Family Medicine

## 2015-10-29 ENCOUNTER — Ambulatory Visit: Payer: Medicaid Other | Admitting: Cardiovascular Disease

## 2015-10-30 ENCOUNTER — Encounter (HOSPITAL_COMMUNITY)
Admission: RE | Admit: 2015-10-30 | Discharge: 2015-10-30 | Disposition: A | Discharge status: Home / Self Care | Payer: Medicaid Other | Source: Ambulatory Visit | Attending: Urology | Admitting: Urology

## 2015-10-30 DIAGNOSIS — C61 Malignant neoplasm of prostate: Secondary | ICD-10-CM | POA: Diagnosis not present

## 2015-10-30 MED ORDER — TECHNETIUM TC 99M MEDRONATE IV KIT
25.0000 | PACK | Freq: Once | INTRAVENOUS | Status: AC | PRN
  Administered 2015-10-30: 25 via INTRAVENOUS

## 2015-11-13 ENCOUNTER — Ambulatory Visit (INDEPENDENT_AMBULATORY_CARE_PROVIDER_SITE_OTHER): Payer: Medicaid Other | Admitting: Cardiovascular Disease

## 2015-11-13 ENCOUNTER — Encounter: Payer: Self-pay | Admitting: Cardiovascular Disease

## 2015-11-13 VITALS — BP 124/80 | HR 64 | Ht 75.0 in | Wt 230.8 lb

## 2015-11-13 DIAGNOSIS — R079 Chest pain, unspecified: Secondary | ICD-10-CM

## 2015-11-13 NOTE — Progress Notes (Signed)
Pt presented as a new patient visit as a referral from [facility].  Pt is well established with our [facility] office. Has seen Dr. Dannielle Burn in the past and more recently has seen Dr. Harl Bowie and Jory Sims. He is not having any acute issues. Same issues for which he was hospitalized in Nov. 2015 at [facility]. Does not want to change his cardiology care to [facility] but would like to follow up with our group in [place]. I have made sure he is not having any acute issues that need addressing today  Will have him call to our office in [place] and set up an appt. In 4-6 weeks.  No charge    Nahser, Wonda Cheng, MD  11/13/2015 11:04 AM    [place] [employer],  [place], [place]  [address] Pager [phone] Phone: [phone]; Fax: [phone]

## 2015-11-13 NOTE — Patient Instructions (Signed)
  Follow-Up: Please call Dr. Myles Gip office @ [phone] for a follow up appointment in 4-6 weeks   If you need a refill on your cardiac medications before your next appointment, please call your pharmacy.   Thank you for choosing [facility]! Christen Bame, RN [phone]

## 2015-11-14 ENCOUNTER — Other Ambulatory Visit: Payer: Medicaid Other

## 2015-11-20 ENCOUNTER — Other Ambulatory Visit: Payer: Self-pay | Admitting: Urology

## 2015-11-22 ENCOUNTER — Telehealth: Payer: Self-pay | Admitting: Cardiovascular Disease

## 2015-11-22 NOTE — Telephone Encounter (Signed)
Incoming call yesterday from [facility] for Mr. Calvin Aguirre  She was asking if a note could be wrote stating she and her mother were here in the office  On 11/13/15 waiting in the lobby while patient was being seen. She used her mother's handicapped sticker this day and when she left the office she had a ticket for the handicapped  Sticker not being shown all the way.  I spoke with Glenna C at the front desk about the note being wrote and after we both talked we both decided we could not determine if both  Calvin Aguirre and her Mother were here in the lobby waiting on the patient. I called [facility] back let her know we cannot write the note she stated " she didn't understand  Why and hung the phone up"

## 2015-11-25 ENCOUNTER — Other Ambulatory Visit: Payer: Self-pay | Admitting: Family Medicine

## 2015-12-12 ENCOUNTER — Ambulatory Visit (HOSPITAL_COMMUNITY)
Admission: RE | Admit: 2015-12-12 | Discharge: 2015-12-12 | Disposition: A | Discharge status: Home / Self Care | Payer: Medicaid Other | Source: Ambulatory Visit | Attending: Urology | Admitting: Urology

## 2015-12-12 ENCOUNTER — Encounter (HOSPITAL_COMMUNITY): Payer: Self-pay

## 2015-12-12 ENCOUNTER — Encounter (HOSPITAL_COMMUNITY)
Admission: RE | Admit: 2015-12-12 | Discharge: 2015-12-12 | Disposition: A | Discharge status: Home / Self Care | Payer: Medicaid Other | Source: Ambulatory Visit | Attending: Urology | Admitting: Urology

## 2015-12-12 DIAGNOSIS — Z01818 Encounter for other preprocedural examination: Secondary | ICD-10-CM | POA: Diagnosis not present

## 2015-12-12 HISTORY — DX: Headache: R51

## 2015-12-12 HISTORY — DX: Headache, unspecified: R51.9

## 2015-12-12 LAB — BASIC METABOLIC PANEL
Anion gap: 7 (ref 5–15)
BUN: 9 mg/dL (ref 6–20)
CALCIUM: 9.1 mg/dL (ref 8.9–10.3)
CO2: 31 mmol/L (ref 22–32)
Chloride: 96 mmol/L — ABNORMAL LOW (ref 101–111)
Creatinine, Ser: 1.26 mg/dL — ABNORMAL HIGH (ref 0.61–1.24)
GFR calc Af Amer: >60 mL/min (ref >60)
GLUCOSE: 104 mg/dL — AB (ref 65–99)
Potassium: 3.6 mmol/L (ref 3.5–5.1)
Sodium: 134 mmol/L — ABNORMAL LOW (ref 135–145)

## 2015-12-12 LAB — CBC
HCT: 46 % (ref 39.0–52.0)
Hemoglobin: 16.6 g/dL (ref 13.0–17.0)
MCH: 32.3 pg (ref 26.0–34.0)
MCHC: 36.1 g/dL — AB (ref 30.0–36.0)
MCV: 89.5 fL (ref 78.0–100.0)
Platelets: 191 10*3/uL (ref 150–400)
RBC: 5.14 MIL/uL (ref 4.22–5.81)
RDW: 13.3 % (ref 11.5–15.5)
WBC: 8 10*3/uL (ref 4.0–10.5)

## 2015-12-12 LAB — ABO/RH: ABO/RH(D): A POS

## 2015-12-12 NOTE — Patient Instructions (Addendum)
Calvin Aguirre  12/12/2015   Your procedure is scheduled on: 12/19/2015    Report to [facility]  Entrance take [place]  elevators to 3rd floor to  [place] at   0930 AM.  Call this number if you have problems the morning of surgery [phone]   Remember: ONLY 1 PERSON MAY GO WITH YOU TO SHORT STAY TO GET  READY MORNING OF [place].  Do not eat food or drink liquids :After Midnight.     Take these medicines the morning of surgery with A SIP OF WATER: Albuterol Inhaler if needed and bring, Omeprazole ( Prilosec), Oxycontin or Percocet if needed, Flomax                                 You may not have any metal on your body including hair pins and              piercings  Do not wear jewelry, , lotions, powders or perfumes, deodorant                          Men may shave face and neck.   Do not bring valuables to the hospital. [place].  Contacts, dentures or bridgework may not be worn into surgery.  Leave suitcase in the car. After surgery it may be brought to your room.       Special Instructions: coughing and deep breathing exercises, leg exercises               Please read over the following fact sheets you were given: _____________________________________________________________________             [facility] - Preparing for Surgery Before surgery, you can play an important role.  Because skin is not sterile, your skin needs to be as free of germs as possible.  You can reduce the number of germs on your skin by washing with CHG (chlorahexidine gluconate) soap before surgery.  CHG is an antiseptic cleaner which kills germs and bonds with the skin to continue killing germs even after washing. Please DO NOT use if you have an allergy to CHG or antibacterial soaps.  If your skin becomes reddened/irritated stop using the CHG and inform your nurse when you arrive at Short Stay. Do not shave  (including legs and underarms) for at least 48 hours prior to the first CHG shower.  You may shave your face/neck. Please follow these instructions carefully:  1.  Shower with CHG Soap the night before surgery and the  morning of Surgery.  2.  If you choose to wash your hair, wash your hair first as usual with your  normal  shampoo.  3.  After you shampoo, rinse your hair and body thoroughly to remove the  shampoo.                           4.  Use CHG as you would any other liquid soap.  You can apply chg directly  to the skin and wash  Gently with a scrungie or clean washcloth.  5.  Apply the CHG Soap to your body ONLY FROM THE NECK DOWN.   Do not use on face/ open                           Wound or open sores. Avoid contact with eyes, ears mouth and genitals (private parts).                       Wash face,  Genitals (private parts) with your normal soap.             6.  Wash thoroughly, paying special attention to the area where your surgery  will be performed.  7.  Thoroughly rinse your body with warm water from the neck down.  8.  DO NOT shower/wash with your normal soap after using and rinsing off  the CHG Soap.                9.  Pat yourself dry with a clean towel.            10.  Wear clean pajamas.            11.  Place clean sheets on your bed the night of your first shower and do not  sleep with pets. Day of Surgery : Do not apply any lotions/deodorants the morning of surgery.  Please wear clean clothes to the hospital/surgery center.  FAILURE TO FOLLOW THESE INSTRUCTIONS MAY RESULT IN THE CANCELLATION OF YOUR SURGERY PATIENT SIGNATURE_________________________________  NURSE SIGNATURE__________________________________  ________________________________________________________________________  WHAT IS A BLOOD TRANSFUSION? Blood Transfusion Information  A transfusion is the replacement of blood or some of its parts. Blood is made up of multiple cells which  provide different functions.  Red blood cells carry oxygen and are used for blood loss replacement.  White blood cells fight against infection.  Platelets control bleeding.  Plasma helps clot blood.  Other blood products are available for specialized needs, such as hemophilia or other clotting disorders. BEFORE THE TRANSFUSION  Who gives blood for transfusions?   Healthy volunteers who are fully evaluated to make sure their blood is safe. This is blood bank blood. Transfusion therapy is the safest it has ever been in the practice of medicine. Before blood is taken from a donor, a complete history is taken to make sure that person has no history of diseases nor engages in risky social behavior (examples are intravenous drug use or sexual activity with multiple partners). The donor's travel history is screened to minimize risk of transmitting infections, such as malaria. The donated blood is tested for signs of infectious diseases, such as HIV and hepatitis. The blood is then tested to be sure it is compatible with you in order to minimize the chance of a transfusion reaction. If you or a relative donates blood, this is often done in anticipation of surgery and is not appropriate for emergency situations. It takes many days to process the donated blood. RISKS AND COMPLICATIONS Although transfusion therapy is very safe and saves many lives, the main dangers of transfusion include:  1. Getting an infectious disease. 2. Developing a transfusion reaction. This is an allergic reaction to something in the blood you were given. Every precaution is taken to prevent this. The decision to have a blood transfusion has been considered carefully by your caregiver before blood is given. Blood is not given unless the benefits outweigh  the risks. AFTER THE TRANSFUSION  Right after receiving a blood transfusion, you will usually feel much better and more energetic. This is especially true if your red blood cells  have gotten low (anemic). The transfusion raises the level of the red blood cells which carry oxygen, and this usually causes an energy increase.  The nurse administering the transfusion will monitor you carefully for complications. HOME CARE INSTRUCTIONS  No special instructions are needed after a transfusion. You may find your energy is better. Speak with your caregiver about any limitations on activity for underlying diseases you may have. SEEK MEDICAL CARE IF:   Your condition is not improving after your transfusion.  You develop redness or irritation at the intravenous (IV) site. SEEK IMMEDIATE MEDICAL CARE IF:  Any of the following symptoms occur over the next 12 hours:  Shaking chills.  You have a temperature by mouth above 102 F (38.9 C), not controlled by medicine.  Chest, back, or muscle pain.  People around you feel you are not acting correctly or are confused.  Shortness of breath or difficulty breathing.  Dizziness and fainting.  You get a rash or develop hives.  You have a decrease in urine output.  Your urine turns a dark color or changes to pink, red, or brown. Any of the following symptoms occur over the next 10 days:  You have a temperature by mouth above 102 F (38.9 C), not controlled by medicine.  Shortness of breath.  Weakness after normal activity.  The white part of the eye turns yellow (jaundice).  You have a decrease in the amount of urine or are urinating less often.  Your urine turns a dark color or changes to pink, red, or brown. Document Released: 07/10/2000 Document Revised: 10/05/2011 Document Reviewed: 02/27/2008 ExitCare Patient Information 2014 [place].  _______________________________________________________________________  Incentive Spirometer  An incentive spirometer is a tool that can help keep your lungs clear and active. This tool measures how well you are filling your lungs with each breath. Taking long deep  breaths may help reverse or decrease the chance of developing breathing (pulmonary) problems (especially infection) following:  A long period of time when you are unable to move or be active. BEFORE THE PROCEDURE   If the spirometer includes an indicator to show your best effort, your nurse or respiratory therapist will set it to a desired goal.  If possible, sit up straight or lean slightly forward. Try not to slouch.  Hold the incentive spirometer in an upright position. INSTRUCTIONS FOR USE  3. Sit on the edge of your bed if possible, or sit up as far as you can in bed or on a chair. 4. Hold the incentive spirometer in an upright position. 5. Breathe out normally. 6. Place the mouthpiece in your mouth and seal your lips tightly around it. 7. Breathe in slowly and as deeply as possible, raising the piston or the ball toward the top of the column. 8. Hold your breath for 3-5 seconds or for as long as possible. Allow the piston or ball to fall to the bottom of the column. 9. Remove the mouthpiece from your mouth and breathe out normally. 10. Rest for a few seconds and repeat Steps 1 through 7 at least 10 times every 1-2 hours when you are awake. Take your time and take a few normal breaths between deep breaths. 11. The spirometer may include an indicator to show your best effort. Use the indicator as a goal to work  toward during each repetition. 12. After each set of 10 deep breaths, practice coughing to be sure your lungs are clear. If you have an incision (the cut made at the time of surgery), support your incision when coughing by placing a pillow or rolled up towels firmly against it. Once you are able to get out of bed, walk around indoors and cough well. You may stop using the incentive spirometer when instructed by your caregiver.  RISKS AND COMPLICATIONS  Take your time so you do not get dizzy or light-headed.  If you are in pain, you may need to take or ask for pain medication  before doing incentive spirometry. It is harder to take a deep breath if you are having pain. AFTER USE  Rest and breathe slowly and easily.  It can be helpful to keep track of a log of your progress. Your caregiver can provide you with a simple table to help with this. If you are using the spirometer at home, follow these instructions: [place] IF:   You are having difficultly using the spirometer.  You have trouble using the spirometer as often as instructed.  Your pain medication is not giving enough relief while using the spirometer.  You develop fever of 100.5 F (38.1 C) or higher. SEEK IMMEDIATE MEDICAL CARE IF:   You cough up bloody sputum that had not been present before.  You develop fever of 102 F (38.9 C) or greater.  You develop worsening pain at or near the incision site. MAKE SURE YOU:   Understand these instructions.  Will watch your condition.  Will get help right away if you are not doing well or get worse. Document Released: 11/23/2006 Document Revised: 10/05/2011 Document Reviewed: 01/24/2007 [facility] Patient Information 2014 [place], [place].   ________________________________________________________________________

## 2015-12-12 NOTE — Progress Notes (Signed)
BMp done 12/12/2015 faxed via EPIC to Dr Alinda Money.

## 2015-12-12 NOTE — Progress Notes (Signed)
EKG-10/10/15- EPIC  10/15- [place] and ECHO- EPIC  11/13/15- LOV- Cardiology- EPIC
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
## 2015-12-19 ENCOUNTER — Inpatient Hospital Stay (HOSPITAL_COMMUNITY): Payer: Medicaid Other | Admitting: Anesthesiology

## 2015-12-19 ENCOUNTER — Encounter (HOSPITAL_COMMUNITY)
Admission: RE | Disposition: A | Discharge status: Home / Self Care | Payer: Self-pay | Source: Ambulatory Visit | Attending: Urology

## 2015-12-19 ENCOUNTER — Inpatient Hospital Stay (HOSPITAL_COMMUNITY)
Admission: RE | Admit: 2015-12-19 | Discharge: 2015-12-20 | DRG: 707 | Disposition: A | Discharge status: Home / Self Care | Payer: Medicaid Other | Source: Ambulatory Visit | Attending: Urology | Admitting: Urology

## 2015-12-19 ENCOUNTER — Encounter (HOSPITAL_COMMUNITY): Payer: Self-pay | Admitting: Anesthesiology

## 2015-12-19 DIAGNOSIS — N529 Male erectile dysfunction, unspecified: Secondary | ICD-10-CM | POA: Diagnosis present

## 2015-12-19 DIAGNOSIS — Z981 Arthrodesis status: Secondary | ICD-10-CM | POA: Diagnosis not present

## 2015-12-19 DIAGNOSIS — I739 Peripheral vascular disease, unspecified: Secondary | ICD-10-CM | POA: Diagnosis present

## 2015-12-19 DIAGNOSIS — G8929 Other chronic pain: Secondary | ICD-10-CM | POA: Diagnosis present

## 2015-12-19 DIAGNOSIS — Z79899 Other long term (current) drug therapy: Secondary | ICD-10-CM

## 2015-12-19 DIAGNOSIS — C61 Malignant neoplasm of prostate: Secondary | ICD-10-CM | POA: Diagnosis present

## 2015-12-19 DIAGNOSIS — F419 Anxiety disorder, unspecified: Secondary | ICD-10-CM | POA: Diagnosis present

## 2015-12-19 DIAGNOSIS — C775 Secondary and unspecified malignant neoplasm of intrapelvic lymph nodes: Secondary | ICD-10-CM | POA: Diagnosis present

## 2015-12-19 DIAGNOSIS — M1990 Unspecified osteoarthritis, unspecified site: Secondary | ICD-10-CM | POA: Diagnosis present

## 2015-12-19 DIAGNOSIS — Z833 Family history of diabetes mellitus: Secondary | ICD-10-CM | POA: Diagnosis not present

## 2015-12-19 DIAGNOSIS — F17210 Nicotine dependence, cigarettes, uncomplicated: Secondary | ICD-10-CM | POA: Diagnosis present

## 2015-12-19 DIAGNOSIS — Z8249 Family history of ischemic heart disease and other diseases of the circulatory system: Secondary | ICD-10-CM

## 2015-12-19 DIAGNOSIS — I69311 Memory deficit following cerebral infarction: Secondary | ICD-10-CM | POA: Diagnosis not present

## 2015-12-19 DIAGNOSIS — Z8042 Family history of malignant neoplasm of prostate: Secondary | ICD-10-CM

## 2015-12-19 DIAGNOSIS — N3289 Other specified disorders of bladder: Secondary | ICD-10-CM | POA: Diagnosis not present

## 2015-12-19 DIAGNOSIS — K219 Gastro-esophageal reflux disease without esophagitis: Secondary | ICD-10-CM | POA: Diagnosis present

## 2015-12-19 DIAGNOSIS — Z7982 Long term (current) use of aspirin: Secondary | ICD-10-CM | POA: Diagnosis not present

## 2015-12-19 DIAGNOSIS — I10 Essential (primary) hypertension: Secondary | ICD-10-CM | POA: Diagnosis present

## 2015-12-19 DIAGNOSIS — E785 Hyperlipidemia, unspecified: Secondary | ICD-10-CM | POA: Diagnosis present

## 2015-12-19 HISTORY — PX: LYMPHADENECTOMY: SHX5960

## 2015-12-19 HISTORY — PX: ROBOT ASSISTED LAPAROSCOPIC RADICAL PROSTATECTOMY: SHX5141

## 2015-12-19 LAB — TYPE AND SCREEN
ABO/RH(D): A POS
ANTIBODY SCREEN: NEGATIVE

## 2015-12-19 LAB — HEMOGLOBIN AND HEMATOCRIT, BLOOD
HEMATOCRIT: 43.5 % (ref 39.0–52.0)
HEMOGLOBIN: 16.1 g/dL (ref 13.0–17.0)

## 2015-12-19 SURGERY — XI ROBOTIC ASSISTED LAPAROSCOPIC RADICAL PROSTATECTOMY LEVEL 3
Anesthesia: General

## 2015-12-19 MED ORDER — PROCHLORPERAZINE EDISYLATE 5 MG/ML IJ SOLN: 10.0000 mg | Freq: Once | INTRAMUSCULAR | Status: DC | PRN

## 2015-12-19 MED ORDER — ONDANSETRON HCL 4 MG/2ML IJ SOLN
4.0000 mg | INTRAMUSCULAR | Status: DC | PRN
  Administered 2015-12-19 – 2015-12-20 (×2): 4 mg via INTRAVENOUS
  Filled 2015-12-19 (×2): qty 2

## 2015-12-19 MED ORDER — LIDOCAINE HCL (CARDIAC) 20 MG/ML IV SOLN
INTRAVENOUS | Status: AC
  Filled 2015-12-19: qty 5

## 2015-12-19 MED ORDER — PANTOPRAZOLE SODIUM 40 MG PO TBEC
40.0000 mg | DELAYED_RELEASE_TABLET | Freq: Every day | ORAL | Status: DC
  Administered 2015-12-20: 40 mg via ORAL
  Filled 2015-12-19: qty 1

## 2015-12-19 MED ORDER — ACETAMINOPHEN 325 MG PO TABS: 650.0000 mg | ORAL_TABLET | ORAL | Status: DC | PRN

## 2015-12-19 MED ORDER — OXYCODONE HCL ER 20 MG PO T12A
20.0000 mg | EXTENDED_RELEASE_TABLET | Freq: Two times a day (BID) | ORAL | Status: DC
  Administered 2015-12-19 – 2015-12-20 (×2): 20 mg via ORAL
  Filled 2015-12-19 (×3): qty 1

## 2015-12-19 MED ORDER — LIDOCAINE HCL (CARDIAC) 20 MG/ML IV SOLN
INTRAVENOUS | Status: DC | PRN
  Administered 2015-12-19: 50 mg via INTRAVENOUS

## 2015-12-19 MED ORDER — BUPIVACAINE-EPINEPHRINE (PF) 0.25% -1:200000 IJ SOLN
INTRAMUSCULAR | Status: AC
  Filled 2015-12-19: qty 30

## 2015-12-19 MED ORDER — FENTANYL CITRATE (PF) 250 MCG/5ML IJ SOLN
INTRAMUSCULAR | Status: AC
  Filled 2015-12-19: qty 5

## 2015-12-19 MED ORDER — TRIAMTERENE-HCTZ 37.5-25 MG PO CAPS
1.0000 | ORAL_CAPSULE | Freq: Every day | ORAL | Status: DC
  Filled 2015-12-19: qty 1

## 2015-12-19 MED ORDER — ONDANSETRON HCL 4 MG/2ML IJ SOLN
INTRAMUSCULAR | Status: AC
  Filled 2015-12-19: qty 2

## 2015-12-19 MED ORDER — HYDROMORPHONE HCL 1 MG/ML IJ SOLN
0.2500 mg | INTRAMUSCULAR | Status: DC | PRN
  Administered 2015-12-19 (×4): 0.5 mg via INTRAVENOUS

## 2015-12-19 MED ORDER — BUPIVACAINE-EPINEPHRINE 0.25% -1:200000 IJ SOLN
INTRAMUSCULAR | Status: DC | PRN
  Administered 2015-12-19: 30 mL

## 2015-12-19 MED ORDER — HEPARIN SODIUM (PORCINE) 1000 UNIT/ML IJ SOLN
INTRAMUSCULAR | Status: AC
  Filled 2015-12-19: qty 1

## 2015-12-19 MED ORDER — LABETALOL HCL 5 MG/ML IV SOLN
INTRAVENOUS | Status: AC
  Filled 2015-12-19: qty 4

## 2015-12-19 MED ORDER — ATORVASTATIN CALCIUM 10 MG PO TABS
20.0000 mg | ORAL_TABLET | Freq: Every day | ORAL | Status: DC
  Administered 2015-12-19 – 2015-12-20 (×2): 20 mg via ORAL
  Filled 2015-12-19 (×3): qty 2

## 2015-12-19 MED ORDER — OXYCODONE HCL 5 MG PO TABS
5.0000 mg | ORAL_TABLET | Freq: Three times a day (TID) | ORAL | Status: DC | PRN
Start: 2015-12-19 — End: 2015-12-20
  Administered 2015-12-20: 5 mg via ORAL
  Filled 2015-12-19: qty 1

## 2015-12-19 MED ORDER — HYDROMORPHONE HCL 1 MG/ML IJ SOLN
INTRAMUSCULAR | Status: DC | PRN
  Administered 2015-12-19 (×2): 1 mg via INTRAVENOUS

## 2015-12-19 MED ORDER — TRAZODONE HCL 50 MG PO TABS
150.0000 mg | ORAL_TABLET | Freq: Every day | ORAL | Status: DC
Start: 2015-12-19 — End: 2015-12-20
  Administered 2015-12-19: 150 mg via ORAL
  Filled 2015-12-19: qty 3

## 2015-12-19 MED ORDER — CEFAZOLIN SODIUM-DEXTROSE 2-4 GM/100ML-% IV SOLN
2.0000 g | INTRAVENOUS | Status: AC
  Administered 2015-12-19: 2 g via INTRAVENOUS
  Filled 2015-12-19: qty 100

## 2015-12-19 MED ORDER — HYDROMORPHONE HCL 1 MG/ML IJ SOLN
INTRAMUSCULAR | Status: AC
  Filled 2015-12-19: qty 1

## 2015-12-19 MED ORDER — KETOROLAC TROMETHAMINE 15 MG/ML IJ SOLN
15.0000 mg | Freq: Four times a day (QID) | INTRAMUSCULAR | Status: DC
  Administered 2015-12-19 – 2015-12-20 (×3): 15 mg via INTRAVENOUS
  Filled 2015-12-19 (×3): qty 1

## 2015-12-19 MED ORDER — DIPHENHYDRAMINE HCL 12.5 MG/5ML PO ELIX: 12.5000 mg | ORAL_SOLUTION | Freq: Four times a day (QID) | ORAL | Status: DC | PRN

## 2015-12-19 MED ORDER — LACTATED RINGERS IV SOLN
INTRAVENOUS | Status: DC | PRN
  Administered 2015-12-19: 11:00:00

## 2015-12-19 MED ORDER — SUGAMMADEX SODIUM 200 MG/2ML IV SOLN
INTRAVENOUS | Status: DC | PRN
  Administered 2015-12-19: 200 mg via INTRAVENOUS

## 2015-12-19 MED ORDER — ROCURONIUM BROMIDE 100 MG/10ML IV SOLN
INTRAVENOUS | Status: DC | PRN
  Administered 2015-12-19: 10 mg via INTRAVENOUS
  Administered 2015-12-19: 5 mg via INTRAVENOUS
  Administered 2015-12-19: 20 mg via INTRAVENOUS
  Administered 2015-12-19: 5 mg via INTRAVENOUS
  Administered 2015-12-19: 55 mg via INTRAVENOUS

## 2015-12-19 MED ORDER — MORPHINE SULFATE (PF) 2 MG/ML IV SOLN
2.0000 mg | INTRAVENOUS | Status: DC | PRN
  Administered 2015-12-19 – 2015-12-20 (×4): 2 mg via INTRAVENOUS
  Filled 2015-12-19 (×3): qty 1
  Filled 2015-12-19: qty 2

## 2015-12-19 MED ORDER — MIDAZOLAM HCL 2 MG/2ML IJ SOLN
INTRAMUSCULAR | Status: AC
Start: 2015-12-19 — End: 2015-12-19
  Filled 2015-12-19: qty 2

## 2015-12-19 MED ORDER — LABETALOL HCL 5 MG/ML IV SOLN
INTRAVENOUS | Status: DC | PRN
  Administered 2015-12-19 (×3): 5 mg via INTRAVENOUS

## 2015-12-19 MED ORDER — CEFAZOLIN SODIUM-DEXTROSE 2-4 GM/100ML-% IV SOLN
INTRAVENOUS | Status: AC
  Filled 2015-12-19: qty 100

## 2015-12-19 MED ORDER — CEFAZOLIN SODIUM 1-5 GM-% IV SOLN
1.0000 g | Freq: Three times a day (TID) | INTRAVENOUS | Status: AC
  Administered 2015-12-19 – 2015-12-20 (×2): 1 g via INTRAVENOUS
  Filled 2015-12-19 (×2): qty 50

## 2015-12-19 MED ORDER — OXYCODONE-ACETAMINOPHEN 10-325 MG PO TABS: 1.0000 | ORAL_TABLET | Freq: Three times a day (TID) | ORAL | Status: DC | PRN

## 2015-12-19 MED ORDER — MIDAZOLAM HCL 5 MG/5ML IJ SOLN
INTRAMUSCULAR | Status: DC | PRN
  Administered 2015-12-19: 2 mg via INTRAVENOUS

## 2015-12-19 MED ORDER — PROPOFOL 10 MG/ML IV BOLUS
INTRAVENOUS | Status: AC
  Filled 2015-12-19: qty 20

## 2015-12-19 MED ORDER — ROCURONIUM BROMIDE 100 MG/10ML IV SOLN
INTRAVENOUS | Status: AC
  Filled 2015-12-19: qty 1

## 2015-12-19 MED ORDER — SULFAMETHOXAZOLE-TRIMETHOPRIM 800-160 MG PO TABS: 1.0000 | ORAL_TABLET | Freq: Two times a day (BID) | ORAL | Status: DC

## 2015-12-19 MED ORDER — DEXAMETHASONE SODIUM PHOSPHATE 10 MG/ML IJ SOLN
INTRAMUSCULAR | Status: DC | PRN
  Administered 2015-12-19: 10 mg via INTRAVENOUS

## 2015-12-19 MED ORDER — PROPOFOL 10 MG/ML IV BOLUS
INTRAVENOUS | Status: DC | PRN
  Administered 2015-12-19: 200 mg via INTRAVENOUS

## 2015-12-19 MED ORDER — HYDROMORPHONE HCL 2 MG/ML IJ SOLN
INTRAMUSCULAR | Status: AC
  Filled 2015-12-19: qty 1

## 2015-12-19 MED ORDER — KCL IN DEXTROSE-NACL 20-5-0.45 MEQ/L-%-% IV SOLN
INTRAVENOUS | Status: AC
  Filled 2015-12-19: qty 1000

## 2015-12-19 MED ORDER — SODIUM CHLORIDE 0.9 % IV BOLUS (SEPSIS)
1000.0000 mL | Freq: Once | INTRAVENOUS | Status: AC
  Administered 2015-12-19: 1000 mL via INTRAVENOUS

## 2015-12-19 MED ORDER — DOCUSATE SODIUM 100 MG PO CAPS
100.0000 mg | ORAL_CAPSULE | Freq: Two times a day (BID) | ORAL | Status: DC
  Administered 2015-12-19 – 2015-12-20 (×2): 100 mg via ORAL
  Filled 2015-12-19 (×2): qty 1

## 2015-12-19 MED ORDER — DEXAMETHASONE SODIUM PHOSPHATE 10 MG/ML IJ SOLN
INTRAMUSCULAR | Status: AC
  Filled 2015-12-19: qty 1

## 2015-12-19 MED ORDER — STERILE WATER FOR IRRIGATION IR SOLN
Status: DC | PRN
  Administered 2015-12-19: 1000 mL

## 2015-12-19 MED ORDER — SODIUM CHLORIDE 0.9 % IR SOLN
Status: DC | PRN
  Administered 2015-12-19: 1000 mL

## 2015-12-19 MED ORDER — ALBUTEROL SULFATE (2.5 MG/3ML) 0.083% IN NEBU: 3.0000 mL | INHALATION_SOLUTION | RESPIRATORY_TRACT | Status: DC | PRN

## 2015-12-19 MED ORDER — KCL IN DEXTROSE-NACL 20-5-0.45 MEQ/L-%-% IV SOLN
INTRAVENOUS | Status: DC
  Administered 2015-12-19 (×4): via INTRAVENOUS
  Filled 2015-12-19 (×4): qty 1000

## 2015-12-19 MED ORDER — OXYCODONE-ACETAMINOPHEN 5-325 MG PO TABS
1.0000 | ORAL_TABLET | Freq: Three times a day (TID) | ORAL | Status: DC | PRN
  Administered 2015-12-20: 1 via ORAL
  Filled 2015-12-19: qty 1

## 2015-12-19 MED ORDER — FENTANYL CITRATE (PF) 100 MCG/2ML IJ SOLN
INTRAMUSCULAR | Status: DC | PRN
  Administered 2015-12-19 (×4): 50 ug via INTRAVENOUS
  Administered 2015-12-19: 100 ug via INTRAVENOUS
  Administered 2015-12-19: 50 ug via INTRAVENOUS
  Administered 2015-12-19: 100 ug via INTRAVENOUS
  Administered 2015-12-19: 50 ug via INTRAVENOUS

## 2015-12-19 MED ORDER — LACTATED RINGERS IV SOLN
INTRAVENOUS | Status: DC | PRN
  Administered 2015-12-19 (×2): via INTRAVENOUS

## 2015-12-19 MED ORDER — CYCLOBENZAPRINE HCL 10 MG PO TABS
10.0000 mg | ORAL_TABLET | Freq: Three times a day (TID) | ORAL | Status: DC | PRN
  Filled 2015-12-19: qty 1

## 2015-12-19 MED ORDER — SUCCINYLCHOLINE CHLORIDE 20 MG/ML IJ SOLN
INTRAMUSCULAR | Status: DC | PRN
  Administered 2015-12-19: 100 mg via INTRAVENOUS

## 2015-12-19 MED ORDER — SUGAMMADEX SODIUM 200 MG/2ML IV SOLN
INTRAVENOUS | Status: AC
  Filled 2015-12-19: qty 2

## 2015-12-19 MED ORDER — DIPHENHYDRAMINE HCL 50 MG/ML IJ SOLN: 12.5000 mg | Freq: Four times a day (QID) | INTRAMUSCULAR | Status: DC | PRN

## 2015-12-19 SURGICAL SUPPLY — 51 items
APPLICATOR COTTON TIP 6IN STRL (MISCELLANEOUS) ×2 IMPLANT
CATH FOLEY 2WAY SLVR 18FR 30CC (CATHETERS) ×4 IMPLANT
CATH ROBINSON RED A/P 16FR (CATHETERS) ×4 IMPLANT
CATH ROBINSON RED A/P 8FR (CATHETERS) ×4 IMPLANT
CATH TIEMANN FOLEY 18FR 5CC (CATHETERS) ×4 IMPLANT
CHLORAPREP W/TINT 26ML (MISCELLANEOUS) ×4 IMPLANT
CLIP LIGATING HEM O LOK PURPLE (MISCELLANEOUS) ×12 IMPLANT
COVER SURGICAL LIGHT HANDLE (MISCELLANEOUS) ×4 IMPLANT
COVER TIP SHEARS 8 DVNC (MISCELLANEOUS) ×2 IMPLANT
COVER TIP SHEARS 8MM DA VINCI (MISCELLANEOUS) ×2
CUTTER ECHEON FLEX ENDO 45 340 (ENDOMECHANICALS) ×4 IMPLANT
DECANTER SPIKE VIAL GLASS SM (MISCELLANEOUS) ×4 IMPLANT
DRAPE ARM DVNC X/XI (DISPOSABLE) ×8 IMPLANT
DRAPE COLUMN DVNC XI (DISPOSABLE) ×2 IMPLANT
DRAPE DA VINCI XI ARM (DISPOSABLE) ×8
DRAPE DA VINCI XI COLUMN (DISPOSABLE) ×2
DRAPE SURG IRRIG POUCH 19X23 (DRAPES) ×4 IMPLANT
DRSG TEGADERM 4X4.75 (GAUZE/BANDAGES/DRESSINGS) ×4 IMPLANT
ELECT REM PT RETURN 9FT ADLT (ELECTROSURGICAL) ×4
ELECTRODE REM PT RTRN 9FT ADLT (ELECTROSURGICAL) ×2 IMPLANT
GLOVE BIO SURGEON STRL SZ 6.5 (GLOVE) ×4 IMPLANT
GLOVE BIO SURGEONS STRL SZ 6.5 (GLOVE) ×2
GLOVE BIOGEL M STRL SZ7.5 (GLOVE) ×10 IMPLANT
GOWN STRL REUS W/TWL LRG LVL3 (GOWN DISPOSABLE) ×14 IMPLANT
HOLDER FOLEY CATH W/STRAP (MISCELLANEOUS) ×4 IMPLANT
IV LACTATED RINGERS 1000ML (IV SOLUTION) ×4 IMPLANT
LIQUID BAND (GAUZE/BANDAGES/DRESSINGS) ×2 IMPLANT
NDL SAFETY ECLIPSE 18X1.5 (NEEDLE) ×2 IMPLANT
NEEDLE HYPO 18GX1.5 SHARP (NEEDLE) ×4
PACK ROBOT UROLOGY CUSTOM (CUSTOM PROCEDURE TRAY) ×4 IMPLANT
RELOAD GREEN ECHELON 45 (STAPLE) ×4 IMPLANT
SEAL CANN UNIV 5-8 DVNC XI (MISCELLANEOUS) ×8 IMPLANT
SEAL XI 5MM-8MM UNIVERSAL (MISCELLANEOUS) ×8
SET TUBE IRRIG SUCTION NO TIP (IRRIGATION / IRRIGATOR) ×4 IMPLANT
SOLUTION ELECTROLUBE (MISCELLANEOUS) ×4 IMPLANT
SUT ETHILON 3 0 PS 1 (SUTURE) ×4 IMPLANT
SUT MNCRL 3 0 RB1 (SUTURE) ×2 IMPLANT
SUT MNCRL 3 0 VIOLET RB1 (SUTURE) ×2 IMPLANT
SUT MNCRL AB 4-0 PS2 18 (SUTURE) ×8 IMPLANT
SUT MONOCRYL 3 0 RB1 (SUTURE) ×4
SUT VIC AB 0 CT1 27 (SUTURE) ×4
SUT VIC AB 0 CT1 27XBRD ANTBC (SUTURE) ×2 IMPLANT
SUT VIC AB 0 UR5 27 (SUTURE) ×4 IMPLANT
SUT VIC AB 2-0 SH 27 (SUTURE) ×4
SUT VIC AB 2-0 SH 27X BRD (SUTURE) ×2 IMPLANT
SUT VICRYL 0 UR6 27IN ABS (SUTURE) ×10 IMPLANT
SYR 27GX1/2 1ML LL SAFETY (SYRINGE) ×4 IMPLANT
TOWEL OR 17X26 10 PK STRL BLUE (TOWEL DISPOSABLE) ×4 IMPLANT
TOWEL OR NON WOVEN STRL DISP B (DISPOSABLE) ×4 IMPLANT
TUBING INSUFFLATION 10FT LAP (TUBING) IMPLANT
WATER STERILE IRR 1500ML POUR (IV SOLUTION) ×4 IMPLANT

## 2015-12-19 NOTE — Transfer of Care (Signed)
Immediate Anesthesia Transfer of Care Note  Patient: Calvin Aguirre  Procedure(s) Performed: Procedure(s): XI ROBOTIC ASSISTED LAPAROSCOPIC RADICAL PROSTATECTOMY LEVEL 3 (N/A) PELVIC LYMPHADENECTOMY (Bilateral)  Patient Location: PACU  Anesthesia Type:General  Level of Consciousness: awake, alert  and oriented  Airway & Oxygen Therapy: Patient Spontanous Breathing and Patient connected to face mask oxygen  Post-op Assessment: Report given to RN and Post -op Vital signs reviewed and stable  Post vital signs: Reviewed and stable  Last Vitals:  Filed Vitals:   12/19/15 0930  BP: 119/82  Pulse: 80  Temp: 36.6 C  Resp: 18    Last Pain:  Filed Vitals:   12/19/15 1011  PainSc: 7       Patients Stated Pain Goal: 5 ([id number])  Complications: No apparent anesthesia complications

## 2015-12-19 NOTE — Interval H&P Note (Signed)
History and Physical Interval Note:  12/19/2015 10:19 AM  Calvin Aguirre  has presented today for surgery, with the diagnosis of PROSTATE CANCER  The various methods of treatment have been discussed with the patient and family. After consideration of risks, benefits and other options for treatment, the patient has consented to  Procedure(s): XI ROBOTIC ASSISTED LAPAROSCOPIC RADICAL PROSTATECTOMY LEVEL 3 (N/A) PELVIC LYMPHADENECTOMY (Bilateral) as a surgical intervention .  The patient's history has been reviewed, patient examined, no change in status, stable for surgery.  I have reviewed the patient's chart and labs.  Questions were answered to the patient's satisfaction.     Hurley Blevins,LES

## 2015-12-19 NOTE — Op Note (Signed)
Preoperative diagnosis: Clinically localized adenocarcinoma of the prostate (clinical stage T3a N0 M0)  Postoperative diagnosis: Clinically localized adenocarcinoma of the prostate (clinical stage T3a N0 M0)  Procedure:  1. Robotic assisted laparoscopic radical prostatectomy (Non nerve sparing) 2. Bilateral robotic assisted laparoscopic extended pelvic lymphadenectomy  Surgeon: Pryor Curia. M.D.  Assistant(s): Debbrah Alar, PA-C  Anesthesia: General  Complications: None  EBL: 100 mL  IVF:  1600 mL crystalloid  Specimens: 1. Prostate and seminal vesicles 2. Right pelvic lymph nodes 3. Left pelvic lymph nodes  Disposition of specimens: Pathology  Drains: 1. 20 Fr coude catheter 2. # 19 Blake pelvic drain  Indication: Calvin Aguirre is a 55 y.o. patient with locally advanced prostate cancer.  After a thorough review of the management options for treatment of prostate cancer, he elected to proceed with surgical therapy and the above procedure(s).  We have discussed the potential benefits and risks of the procedure, side effects of the proposed treatment, the likelihood of the patient achieving the goals of the procedure, and any potential problems that might occur during the procedure or recuperation. Informed consent has been obtained.  Description of procedure:  The patient was taken to the operating room and a general anesthetic was administered. He was given preoperative antibiotics, placed in the dorsal lithotomy position, and prepped and draped in the usual sterile fashion. Next a preoperative timeout was performed. A urethral catheter was placed into the bladder and a site was selected near the umbilicus for placement of the camera port. This was placed using a standard open Hassan technique which allowed entry into the peritoneal cavity under direct vision and without difficulty. An 8 mm port was placed and a pneumoperitoneum established. The camera was then used to  inspect the abdomen and there was no evidence of any intra-abdominal injuries or other abnormalities. The remaining abdominal ports were then placed. 8 mm robotic ports were placed in the right lower quadrant, left lower quadrant, and far left lateral abdominal wall. A 5 mm port was placed in the right upper quadrant and a 12 mm port was placed in the right lateral abdominal wall for laparoscopic assistance. All ports were placed under direct vision without difficulty. The surgical cart was then docked.   Utilizing the cautery scissors, the bladder was reflected posteriorly allowing entry into the space of Retzius and identification of the endopelvic fascia and prostate. The periprostatic fat was then removed from the prostate allowing full exposure of the endopelvic fascia. The endopelvic fascia was then incised from the apex back to the base of the prostate bilaterally and the underlying levator muscle fibers were swept laterally off the prostate thereby isolating the dorsal venous complex. The dorsal vein was then stapled and divided with a 45 mm Flex Echelon stapler. Attention then turned to the bladder neck which was divided anteriorly thereby allowing entry into the bladder and exposure of the urethral catheter. The catheter balloon was deflated and the catheter was brought into the operative field and used to retract the prostate anteriorly. The posterior bladder neck was then examined and was divided allowing further dissection between the bladder and prostate posteriorly until the vasa deferentia and seminal vessels were identified. The vasa deferentia were isolated, divided, and lifted anteriorly. The seminal vesicles were dissected down to their tips with care to control the seminal vascular arterial blood supply. These structures were then lifted anteriorly and the space between Denonvillier's fascia and the anterior rectum was developed with a combination of  sharp and blunt dissection. This isolated  the vascular pedicles of the prostate.  A wide non nerve sparing dissection was performed with Weck clips used to ligate the vascular pedicles of the prostate bilaterally. The vascular pedicles of the prostate were then divided.   The urethra was then sharply transected allowing the prostate specimen to be disarticulated. The pelvis was copiously irrigated and hemostasis was ensured. There was no evidence for rectal injury.  Attention then turned to the right pelvic sidewall. The fibrofatty tissue extending from the genitofemoral nerve laterally to the confluence of the iliac vessels proximally to the hypogastric artery posteriorly and Cooper's ligament distally was dissected free from the pelvic sidewall with care to preserve the obturator nerve and major vascular structures. Weck clips were used for lymphostasis and hemostasis. An identical procedure was then performed on the contralateral side and the lymphatic packets were removed for permanent pathologic analysis.  Attention then turned to the urethral anastomosis. A 2-0 Vicryl slip knot was placed between Denonvillier's fascia, the posterior bladder neck, and the posterior urethra to reapproximate these structures. A double-armed 3-0 Monocryl suture was then used to perform a 360 running tension-free anastomosis between the bladder neck and urethra. A new urethral catheter was then placed into the bladder and irrigated. There were no blood clots within the bladder and the anastomosis appeared to be watertight. A #19 Blake drain was then brought through the left lateral 8 mm port site and positioned appropriately within the pelvis. It was secured to the skin with a nylon suture. The surgical cart was then undocked. The right lateral 12 mm port site was closed at the fascial level with a 0 Vicryl suture placed laparoscopically. All remaining ports were then removed under direct vision. The prostate specimen was removed intact within the Endopouch  retrieval bag via the periumbilical camera port site. This fascial opening was closed with two running 0 Vicryl sutures. 0.25% Marcaine was then injected into all port sites and all incisions were reapproximated at the skin level with staples. Sterile dressings were applied. The patient appeared to tolerate the procedure well and without complications. The patient was able to be extubated and transferred to the recovery unit in satisfactory condition.  Pryor Curia MD

## 2015-12-19 NOTE — Anesthesia Procedure Notes (Signed)
Procedure Name: Intubation Date/Time: 12/19/2015 11:17 AM Performed by: Noralyn Pick D Pre-anesthesia Checklist: Patient identified, Emergency Drugs available, Suction available and Patient being monitored Patient Re-evaluated:Patient Re-evaluated prior to inductionOxygen Delivery Method: Circle System Utilized Preoxygenation: Pre-oxygenation with 100% oxygen Intubation Type: IV induction Ventilation: Mask ventilation without difficulty Laryngoscope Size: Mac and 4 Grade View: Grade I Tube type: Oral Tube size: 7.5 mm Number of attempts: 1 Airway Equipment and Method: Stylet and Oral airway Placement Confirmation: ETT inserted through vocal cords under direct vision,  positive ETCO2 and breath sounds checked- equal and bilateral Secured at: 22 cm Tube secured with: Tape Dental Injury: Teeth and Oropharynx as per pre-operative assessment

## 2015-12-19 NOTE — Progress Notes (Signed)
Hgb. And Hct. Drawn by lab. 

## 2015-12-19 NOTE — Anesthesia Postprocedure Evaluation (Signed)
Anesthesia Post Note  Patient: Calvin Aguirre  Procedure(s) Performed: Procedure(s) (LRB): XI ROBOTIC ASSISTED LAPAROSCOPIC RADICAL PROSTATECTOMY LEVEL 3 (N/A) PELVIC LYMPHADENECTOMY (Bilateral)  Patient location during evaluation: PACU Anesthesia Type: General Level of consciousness: awake and alert Pain management: pain level controlled Vital Signs Assessment: post-procedure vital signs reviewed and stable Respiratory status: spontaneous breathing, nonlabored ventilation, respiratory function stable and patient connected to nasal cannula oxygen Cardiovascular status: blood pressure returned to baseline and stable Postop Assessment: no signs of nausea or vomiting Anesthetic complications: no    Last Vitals:  Filed Vitals:   12/19/15 1515 12/19/15 1543  BP: 168/98 173/99  Pulse: 65 72  Temp: 36.6 C 36.7 C  Resp: 15 14    Last Pain:  Filed Vitals:   12/19/15 1631  PainSc: 8                  Reah Justo J

## 2015-12-19 NOTE — Anesthesia Preprocedure Evaluation (Addendum)
Anesthesia Evaluation  Patient identified by MRN, date of birth, ID band Patient awake    Reviewed: Allergy & Precautions, NPO status , Patient's Chart, lab work & pertinent test results  Airway Mallampati: II  TM Distance: >3 FB Neck ROM: Full    Dental  (+) Upper Dentures, Lower Dentures   Pulmonary Current Smoker,    Pulmonary exam normal breath sounds clear to auscultation       Cardiovascular Exercise Tolerance: Good hypertension, Pt. on medications + Peripheral Vascular Disease  Normal cardiovascular exam Rhythm:Regular Rate:Normal     Neuro/Psych  Headaches, PSYCHIATRIC DISORDERS Anxiety TIACVA    GI/Hepatic negative GI ROS, GERD  Medicated,(+) Hepatitis -, B  Endo/Other  negative endocrine ROS  Renal/GU Renal diseaseCr 1.26 K 3.6   negative genitourinary   Musculoskeletal  (+) Arthritis ,   Abdominal   Peds negative pediatric ROS (+)  Hematology negative hematology ROS (+)   Anesthesia Other Findings   Reproductive/Obstetrics negative OB ROS                            Anesthesia Physical Anesthesia Plan  ASA: III  Anesthesia Plan: General   Post-op Pain Management:    Induction: Intravenous  Airway Management Planned: Oral ETT  Additional Equipment:   Intra-op Plan:   Post-operative Plan: Extubation in OR  Informed Consent: I have reviewed the patients History and Physical, chart, labs and discussed the procedure including the risks, benefits and alternatives for the proposed anesthesia with the patient or authorized representative who has indicated his/her understanding and acceptance.   Dental advisory given  Plan Discussed with: CRNA  Anesthesia Plan Comments:         Anesthesia Quick Evaluation

## 2015-12-19 NOTE — Progress Notes (Signed)
Hgb.- 16.1- Hct. 43.5- results noted

## 2015-12-19 NOTE — Progress Notes (Signed)
Patient ID: Calvin Aguirre, male   DOB: 02-17-1961, 55 y.o.   MRN: [id number]  Post-op note  Subjective: The patient is doing well.  Complains of bladder spasms.  Objective: Vital signs in last 24 hours: Temp:  [97.6 F (36.4 C)-98.1 F (36.7 C)] 98.1 F (36.7 C) (05/25 1543) Pulse Rate:  [65-80] 72 (05/25 1543) Resp:  [12-19] 14 (05/25 1543) BP: (119-173)/(81-99) 173/99 mmHg (05/25 1543) SpO2:  [95 %-100 %] 98 % (05/25 1543) Weight:  [102.967 kg (227 lb)] 102.967 kg (227 lb) (05/25 1009)  Intake/Output from previous day:   Intake/Output this shift: Total I/O In: 2700 [I.V.:1700; IV Piggyback:1000] Out: 100 [Blood:100]  Physical Exam:  General: Alert and oriented. Abdomen: Soft, Nondistended. Incisions: Clean and dry. GU: Urine clear  Lab Results:  Recent Labs  12/19/15 1415  HGB 16.1  HCT 43.5    Assessment/Plan: POD#0   1) Continue to monitor, ambulate, IS 2) B&O suppository if bladder spasms continue 3) Pt will be started back on his normal po pain medication considering his chronic pain and high dose of chronic narcotic requirement.    Roxy Aguirre, Calvin Bonito. MD   LOS: 0 days   Calvin Aguirre,LES 12/19/2015, 3:46 PM

## 2015-12-19 NOTE — Discharge Instructions (Signed)

## 2015-12-20 LAB — HEMOGLOBIN AND HEMATOCRIT, BLOOD
HEMATOCRIT: 44.7 % (ref 39.0–52.0)
Hemoglobin: 16.5 g/dL (ref 13.0–17.0)

## 2015-12-20 MED ORDER — BISACODYL 10 MG RE SUPP
10.0000 mg | Freq: Once | RECTAL | Status: AC
  Administered 2015-12-20: 10 mg via RECTAL
  Filled 2015-12-20: qty 1

## 2015-12-20 MED ORDER — TRIAMTERENE-HCTZ 37.5-25 MG PO TABS
1.0000 | ORAL_TABLET | Freq: Every day | ORAL | Status: DC
  Administered 2015-12-20: 1 via ORAL
  Filled 2015-12-20: qty 1

## 2015-12-20 NOTE — Progress Notes (Signed)
Patient ID: Calvin Aguirre, male   DOB: 1961-01-12, 55 y.o.   MRN: [id number]  1 Day Post-Op Subjective: The patient is doing well.  No nausea or vomiting. Pain is adequately controlled.  Objective: Vital signs in last 24 hours: Temp:  [97.6 F (36.4 C)-99.3 F (37.4 C)] 98.4 F (36.9 C) (05/26 0531) Pulse Rate:  [65-85] 85 (05/26 0531) Resp:  [12-19] 16 (05/26 0531) BP: (119-173)/(62-99) 123/99 mmHg (05/26 0531) SpO2:  [95 %-100 %] 97 % (05/26 0531) Weight:  [102.967 kg (227 lb)] 102.967 kg (227 lb) (05/25 1009)  Intake/Output from previous day: 05/25 0701 - 05/26 0700 In: 5537.5 [P.O.:480; I.V.:3957.5; IV Piggyback:1100] Out: 2190 [Urine:1650; Drains:440; Blood:100] Intake/Output this shift:    Physical Exam:  General: Alert and oriented. CV: RRR Lungs: Clear bilaterally. GI: Soft, Nondistended. Incisions: Clean, dry, and intact Urine: Clear Extremities: Nontender, no erythema, no edema.  Lab Results:  Recent Labs  12/19/15 1415 12/20/15 0435  HGB 16.1 16.5  HCT 43.5 44.7      Assessment/Plan: POD# 1 s/p robotic prostatectomy.  1) SL IVF 2) Ambulate, Incentive spirometry 3) Transition to oral pain medication 4) Dulcolax suppository 5) D/C pelvic drain 6) Plan for likely discharge later today   Pryor Curia. MD   LOS: 1 day   Malisa Ruggiero,LES 12/20/2015, 7:48 AM

## 2015-12-20 NOTE — Discharge Summary (Signed)
  Date of admission: 12/19/2015  Date of discharge: 12/20/2015  Admission diagnosis: Prostate Cancer  Discharge diagnosis: Prostate Cancer  History and Physical: For full details, please see admission history and physical. Briefly, Calvin Aguirre is a 55 y.o. gentleman with localized prostate cancer.  After discussing management/treatment options, he elected to proceed with surgical treatment.  Hospital Course: Calvin Aguirre was taken to the operating room on 12/19/2015 and underwent a robotic assisted laparoscopic radical prostatectomy. He tolerated this procedure well and without complications. Postoperatively, he was able to be transferred to a regular hospital room following recovery from anesthesia.  He was able to begin ambulating the night of surgery. He remained hemodynamically stable overnight.  He had excellent urine output with appropriately minimal output from his pelvic drain and his pelvic drain was removed on POD #1.  He was transitioned to oral pain medication, tolerated a clear liquid diet, and had met all discharge criteria and was able to be discharged home later on POD#1.  Laboratory values:  Recent Labs  12/19/15 1415 12/20/15 0435  HGB 16.1 16.5  HCT 43.5 44.7    Disposition: Home  Discharge instruction: He was instructed to be ambulatory but to refrain from heavy lifting, strenuous activity, or driving. He was instructed on urethral catheter care.  Discharge medications:     Medication List    STOP taking these medications        aspirin EC 81 MG tablet     FISH OIL + D3 PO     sildenafil 20 MG tablet  Commonly known as:  REVATIO     tamsulosin 0.4 MG Caps capsule  Commonly known as:  FLOMAX      TAKE these medications        albuterol 108 (90 Base) MCG/ACT inhaler  Commonly known as:  PROVENTIL HFA;VENTOLIN HFA  Inhale 2 puffs into the lungs every 4 (four) hours as needed for shortness of breath.     atorvastatin 20 MG tablet  Commonly known as:   LIPITOR  Take 1 tablet (20 mg total) by mouth daily.     cyclobenzaprine 10 MG tablet  Commonly known as:  FLEXERIL  TAKE 1 TABLET BY MOUTH 3 TIMES DAILY.     omeprazole 20 MG capsule  Commonly known as:  PRILOSEC  TAKE 1 CAPSULE BY MOUTH ONCE A Aguirre.     oxyCODONE-acetaminophen 10-325 MG tablet  Commonly known as:  PERCOCET  Take 1 tablet by mouth every 8 (eight) hours as needed for pain.     OXYCONTIN 20 mg 12 hr tablet  Generic drug:  oxyCODONE  Take 20 mg by mouth every 12 (twelve) hours.     sulfamethoxazole-trimethoprim 800-160 MG tablet  Commonly known as:  BACTRIM DS,SEPTRA DS  Take 1 tablet by mouth 2 (two) times daily. Start the Aguirre prior to foley removal appointment     traZODone 150 MG tablet  Commonly known as:  DESYREL  TAKE 2 TABLETS BY MOUTH AT BEDTIME.     triamterene-hydrochlorothiazide 37.5-25 MG capsule  Commonly known as:  DYAZIDE  Take 1 capsule by mouth daily.     zolpidem 10 MG tablet  Commonly known as:  AMBIEN  Take 1 tablet (10 mg total) by mouth at bedtime as needed for sleep.        Followup: He will followup in 1 week for catheter removal and to discuss his surgical pathology results.

## 2015-12-26 ENCOUNTER — Other Ambulatory Visit: Payer: Self-pay | Admitting: Family Medicine

## 2015-12-26 NOTE — Telephone Encounter (Signed)
Last seen 10/10/15  Dr Wendi Snipes

## 2016-01-01 ENCOUNTER — Other Ambulatory Visit: Payer: Self-pay | Admitting: *Deleted

## 2016-01-01 MED ORDER — ZOLPIDEM TARTRATE 10 MG PO TABS: 10.0000 mg | ORAL_TABLET | Freq: Every evening | ORAL | Status: DC | PRN

## 2016-01-01 NOTE — Telephone Encounter (Signed)
Last filled 11/25/15, last seen 10/10/15. Route to pool so nurse can call in

## 2016-01-01 NOTE — Telephone Encounter (Signed)
Refill called to [facility]. Pt aware aware

## 2016-01-10 ENCOUNTER — Encounter: Payer: Self-pay | Admitting: Family Medicine

## 2016-01-10 ENCOUNTER — Ambulatory Visit (INDEPENDENT_AMBULATORY_CARE_PROVIDER_SITE_OTHER): Payer: Medicaid Other | Admitting: Family Medicine

## 2016-01-10 VITALS — BP 109/78 | HR 93 | Temp 97.1°F | Ht 74.0 in | Wt 220.0 lb

## 2016-01-10 DIAGNOSIS — R32 Unspecified urinary incontinence: Secondary | ICD-10-CM | POA: Diagnosis not present

## 2016-01-10 DIAGNOSIS — I10 Essential (primary) hypertension: Secondary | ICD-10-CM

## 2016-01-10 DIAGNOSIS — F411 Generalized anxiety disorder: Secondary | ICD-10-CM

## 2016-01-10 DIAGNOSIS — E785 Hyperlipidemia, unspecified: Secondary | ICD-10-CM

## 2016-01-10 MED ORDER — CITALOPRAM HYDROBROMIDE 20 MG PO TABS: ORAL_TABLET | ORAL | Status: DC

## 2016-01-10 NOTE — Patient Instructions (Signed)
Great to see you!  Start citalopram, 1/2 pill daily for 2 weeks then 1 pill daily  Come back in 3-4 weeks to follow up for the medicine.

## 2016-01-10 NOTE — Progress Notes (Signed)
   HPI  Patient presents today here to discuss mood and recent prostate surgery.  Mood States that he is very anxious, he is not discussed this with anyone the past, has some mild depression Denies any suicidal ideation. Does not want to take addictive medications like Xanax.  Prostate cancer Recent prostate surgery Now having erectile dysfunction in complete urinary incontinence Requests prescription for depends  Hypertension Good medication compliance No headache, chest pain, dyspnea, palpitations, leg edema  PMH: Smoking status noted ROS: Per HPI  Objective: BP 109/78 mmHg  Pulse 93  Temp(Src) 97.1 F (36.2 C) (Oral)  Ht 6\' 2"  (1.88 m)  Wt 220 lb (99.791 kg)  BMI 28.23 kg/m2 Gen: NAD, alert, cooperative with exam HEENT: NCAT CV: RRR, good S1/S2, no murmur Resp: CTABL, no wheezes, non-labored Ext: No edema, warm Neuro: Alert and oriented, No gross deficits  Assessment and plan:  # Anxiety Starting citalopram Return to clinic in 3-4 weeks No SI  # Urinary incontinence Prescription for depends written  # Hypertension Well-controlled, no medication changes today Labs       Meds ordered this encounter  Medications  . ibuprofen (ADVIL,MOTRIN) 800 MG tablet    Sig: Take 800 mg by mouth every 8 (eight) hours as needed.  . citalopram (CELEXA) 20 MG tablet    Sig: 1/2 pill for 2 weeks then 1 pill daily    Dispense:  30 tablet    Refill:  [place], MD [facility] 01/10/2016, 11:58 AM

## 2016-01-11 LAB — CMP14+EGFR
ALBUMIN: 4.1 g/dL (ref 3.5–5.5)
ALT: 14 IU/L (ref 0–44)
AST: 18 IU/L (ref 0–40)
Albumin/Globulin Ratio: 1.5 (ref 1.2–2.2)
Alkaline Phosphatase: 65 IU/L (ref 39–117)
BUN/Creatinine Ratio: 7 — ABNORMAL LOW (ref 9–20)
BUN: 9 mg/dL (ref 6–24)
Bilirubin Total: 0.3 mg/dL (ref 0.0–1.2)
CALCIUM: 9.8 mg/dL (ref 8.7–10.2)
CO2: 25 mmol/L (ref 18–29)
CREATININE: 1.29 mg/dL — AB (ref 0.76–1.27)
Chloride: 97 mmol/L (ref 96–106)
GFR calc Af Amer: 71 mL/min/{1.73_m2} (ref >59)
GFR, EST NON AFRICAN AMERICAN: 62 mL/min/{1.73_m2} (ref >59)
GLOBULIN, TOTAL: 2.8 g/dL (ref 1.5–4.5)
Glucose: 114 mg/dL — ABNORMAL HIGH (ref 65–99)
Potassium: 4 mmol/L (ref 3.5–5.2)
SODIUM: 141 mmol/L (ref 134–144)
Total Protein: 6.9 g/dL (ref 6.0–8.5)

## 2016-01-11 LAB — CBC WITH DIFFERENTIAL/PLATELET
Basophils Absolute: 0.1 10*3/uL (ref 0.0–0.2)
Basos: 1 %
EOS (ABSOLUTE): 0.1 10*3/uL (ref 0.0–0.4)
EOS: 1 %
HEMATOCRIT: 46.9 % (ref 37.5–51.0)
HEMOGLOBIN: 16.4 g/dL (ref 12.6–17.7)
IMMATURE GRANULOCYTES: 0 %
Immature Grans (Abs): 0 10*3/uL (ref 0.0–0.1)
Lymphocytes Absolute: 2 10*3/uL (ref 0.7–3.1)
Lymphs: 23 %
MCH: 32.5 pg (ref 26.6–33.0)
MCHC: 35 g/dL (ref 31.5–35.7)
MCV: 93 fL (ref 79–97)
MONOCYTES: 6 %
Monocytes Absolute: 0.5 10*3/uL (ref 0.1–0.9)
NEUTROS PCT: 69 %
Neutrophils Absolute: 5.8 10*3/uL (ref 1.4–7.0)
Platelets: 354 10*3/uL (ref 150–379)
RBC: 5.05 x10E6/uL (ref 4.14–5.80)
RDW: 13.8 % (ref 12.3–15.4)
WBC: 8.4 10*3/uL (ref 3.4–10.8)

## 2016-01-14 ENCOUNTER — Ambulatory Visit: Payer: Medicaid Other | Admitting: Physical Therapy

## 2016-01-14 ENCOUNTER — Other Ambulatory Visit: Payer: Self-pay | Admitting: Family Medicine

## 2016-01-14 DIAGNOSIS — E785 Hyperlipidemia, unspecified: Secondary | ICD-10-CM

## 2016-01-14 NOTE — Addendum Note (Signed)
Addended by: Liliane Bade on: 01/14/2016 12:13 PM   Modules accepted: Orders

## 2016-01-15 ENCOUNTER — Other Ambulatory Visit: Payer: Self-pay | Admitting: Family Medicine

## 2016-01-15 DIAGNOSIS — I6522 Occlusion and stenosis of left carotid artery: Secondary | ICD-10-CM

## 2016-01-15 LAB — LIPID PANEL
CHOL/HDL RATIO: 4.8 ratio (ref 0.0–5.0)
Cholesterol, Total: 197 mg/dL (ref 100–199)
HDL: 41 mg/dL (ref >39)
LDL CALC: 109 mg/dL — AB (ref 0–99)
TRIGLYCERIDES: 233 mg/dL — AB (ref 0–149)
VLDL Cholesterol Cal: 47 mg/dL — ABNORMAL HIGH (ref 5–40)

## 2016-01-15 LAB — SPECIMEN STATUS REPORT

## 2016-01-15 MED ORDER — ATORVASTATIN CALCIUM 40 MG PO TABS: 40.0000 mg | ORAL_TABLET | Freq: Every day | ORAL | Status: DC

## 2016-01-22 ENCOUNTER — Ambulatory Visit: Payer: Medicaid Other | Attending: Urology | Admitting: Physical Therapy

## 2016-01-22 ENCOUNTER — Encounter: Payer: Self-pay | Admitting: Physical Therapy

## 2016-01-22 DIAGNOSIS — Z483 Aftercare following surgery for neoplasm: Secondary | ICD-10-CM

## 2016-01-22 DIAGNOSIS — M6281 Muscle weakness (generalized): Secondary | ICD-10-CM
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
## 2016-01-22 NOTE — Patient Instructions (Addendum)
Certain foods and liquids will decrease the pH making the urine more acidic.  Urinary urgency increases when the urine has a low pH.  Most common irritants: alcohol, carbonated beverages and caffinated beverages.  Foods to avoid: apple juice, apples, ascorbic acid, canteloupes, chili, citrus fruits, coffee, cranberries, grapes, guava, peaches, pepper, pineapple, plums, strawberries, tea, tomatoes, and vinegar.  Drinking plenty of water may help to increase the pH and dilute out any of the effects of specific irritants.  Foods that are NOT irritating to the bladder include: Pears, papayas, sun-brewed teas, watermelons, non-citrus herbal teas, apricots, kava and low-acid instant drinks (Postum)  Scar Massage  Scar massage is done to improve the mobility of scar, decrease scar tissue from building up, reduce adhesions, and prevent Keloids from forming. Start scar massage after scabs have fallen off by themselves and no open areas. The first few weeks after surgery, it is normal for a scar to appear pink or red and slightly raised. Scars can itch or have areas of numbness. Some scars may be sensitive.   Direct Scar massage: after scar is healed, no opening, no scab 1.  Place pads of two fingers together directly on the scar starting at one end of the scar. Move the fingers up and down across the scar holding 5 seconds one direction.  Then go opposite direction hold 5 seconds.  2. Move over to the next section of the scar and repeat.  Work your way along the entire length of the scar.   3. Next make diagonal movements along the scar holding 5 seconds at one direction. 4. Next movement is side to side. 5. Do not rub fingers over the scar.  Instead keep firm pressure and move scar over the tissue it is on top   Scar Lift and Roll 12 weeks after surgery. 1. Pinch a small amount of the scar between your first two fingers and thumb.  2. Roll the scar between your fingers for 5 to 15 seconds. 3. Move  along the scar and repeat until you have massaged the entire length of scar.   Stop the massage and call your doctor if you notice: 1. Increased redness 2. Bleeding from scar 3. Seepage coming from the scar 4. Scar is warmer and has increased pain    Use a stick to roll up and down the left inner thigh to relax the muscle.  Butterfly, Supine    Lie on back, feet together. Lower knees toward floor. Hold _30__ seconds. Repeat _2__ times per session. Do _1__ sessions per day. While in this position massage just under the scrotum with fingers gently in the middle for 3 min.  Copyright  VHI. All rights reserved.   About Abdominal Massage  Abdominal massage, also called external colon massage, is a self-treatment circular massage technique that can reduce and eliminate gas and ease constipation. The colon naturally contracts in waves in a clockwise direction starting from inside the right hip, moving up toward the ribs, across the belly, and down inside the left hip.  When you perform circular abdominal massage, you help stimulate your colon's normal wave pattern of movement called peristalsis.  It is most beneficial when done after eating.  Positioning You can practice abdominal massage with oil while lying down, or in the shower with soap.  Some people find that it is just as effective to do the massage through clothing while sitting or standing.  How to Massage Start by placing your finger tips or knuckles  on your right side, just inside your hip bone.  . Make small circular movements while you move upward toward your rib cage.   . Once you reach the bottom right side of your rib cage, take your circular movements across to the left side of the bottom of your rib cage.  . Next, move downward until you reach the inside of your left hip bone.  This is the path your feces travel in your colon. . Continue to perform your abdominal massage in this pattern for 10 minutes each day.     You  can apply as much pressure as is comfortable in your massage.  Start gently and build pressure as you continue to practice.  Notice any areas of pain as you massage; areas of slight pain may be relieved as you massage, but if you have areas of significant or intense pain, consult with your healthcare provider.  Other Considerations . General physical activity including bending and stretching can have a beneficial massage-like effect on the colon.  Deep breathing can also stimulate the colon because breathing deeply activates the same nervous system that supplies the colon.   . Abdominal massage should always be used in combination with a bowel-conscious diet that is high in the proper type of fiber for you, fluids (primarily water), and a regular exercise program.   Toileting Techniques for Bowel Movements (Defecation) Using your belly (abdomen) and pelvic floor muscles to have a bowel movement is usually instinctive.  Sometimes people can have problems with these muscles and have to relearn proper defecation (emptying) techniques.  If you have weakness in your muscles, organs that are falling out, decreased sensation in your pelvis, or ignore your urge to go, you may find yourself straining to have a bowel movement.  You are straining if you are: . holding your breath or taking in a huge gulp of air and holding it  . keeping your lips and jaw tensed and closed tightly . turning red in the face because of excessive pushing or forcing . developing or worsening your  hemorrhoids . getting faint while pushing . not emptying completely and have to defecate many times a day  If you are straining, you are actually making it harder for yourself to have a bowel movement.  Many people find they are pulling up with the pelvic floor muscles and closing off instead of opening the anus. Due to lack pelvic floor relaxation and coordination the abdominal muscles, one has to work harder to push the feces  out.  Many people have never been taught how to defecate efficiently and effectively.  Notice what happens to your body when you are having a bowel movement.  While you are sitting on the toilet pay attention to the following areas: . Jaw and mouth position . Angle of your hips   . Whether your feet touch the ground or not . Arm placement  . Spine position . Waist . Belly tension . Anus (opening of the anal canal)  An Evacuation/Defecation Plan   Here are the 4 basic points:  1. Lean forward enough for your elbows to rest on your knees 2. Support your feet on the floor or use a low stool if your feet don't touch the floor  3. Push out your belly as if you have swallowed a beach ball-you should feel a widening of your waist 4. Open and relax your pelvic floor muscles, rather than tightening around the anus  The following conditions my require modifications to your toileting posture:  . If you have had surgery in the past that limits your back, hip, pelvic, knee or ankle flexibility . Constipation   Your healthcare practitioner may make the following additional suggestions and adjustments:  1) Sit on the toilet  a) Make sure your feet are supported. b) Notice your hip angle and spine position-most people find it effective to lean forward or raise their knees, which can help the muscles around the anus to relax  c) When you lean forward, place your forearms on your thighs for support  2) Relax suggestions a) Breath deeply in through your nose and out slowly through your mouth as if you are smelling the flowers and blowing out the candles. b) To become aware of how to relax your muscles, contracting and releasing muscles can be helpful.  Pull your pelvic floor muscles in tightly by using the image of holding back gas, or closing around the anus (visualize making a circle smaller) and lifting the anus up and in.  Then release the muscles and your anus should drop down and feel  open. Repeat 5 times ending with the feeling of relaxation. c) Keep your pelvic floor muscles relaxed; let your belly bulge out. d) The digestive tract starts at the mouth and ends at the anal opening, so be sure to relax both ends of the tube.  Place your tongue on the roof of your mouth with your teeth separated.  This helps relax your mouth and will help to relax the anus at the same time.  3) Empty (defecation) a) Keep your pelvic floor and sphincter relaxed, then bulge your anal muscles.  Make the anal opening wide.  b) Stick your belly out as if you have swallowed a beach ball. c) Make your belly wall hard using your belly muscles while continuing to breathe. Doing this makes it easier to open your anus. d) Breath out and give a grunt (or try using other sounds such as ahhhh, shhhhh, ohhhh or grrrrrrr).  4) Finish a) As you finish your bowel movement, pull the pelvic floor muscles up and in.  This will leave your anus in the proper place rather than remaining pushed out and down. If you leave your anus pushed out and down, it will start to feel as though that is normal and give you incorrect signals about needing to have a bowel movement.    Earlie Counts, PT [facility] [address] [place], [place] [address]  Slow Contraction: Gravity Eliminated (Hook-Lying)    Lie with hips and knees bent. Slowly squeeze pelvic floor for _1__ seconds. Rest for _1__ seconds. Repeat _5__ times. Do _4__ times a day. When good laying down progression is to do in sitting then next step is standing.   Copyright  VHI. All rights reserved.  Slow Contraction: Gravity Eliminated (Hook-Lying)    Lie with hips and knees bent. Slowly squeeze pelvic floor for _10__ seconds. Rest for _5__ seconds. Repeat _10__ times. Do _4__ times a day. Progression of exercises is to do in sitting, then standing.   Copyright  VHI. All rights reserved.  Posture - Sitting    Sit  upright, head facing forward. Try using a roll to support lower back. Keep shoulders relaxed, and avoid rounded back. Keep hips level with knees. Avoid crossing legs for long periods.   Copyright  VHI. All rights reserved.  Sleeping on Side    Place pillow  between knees and feet. Use cervical support under neck and a roll around waist as needed.   Copyright  VHI. All rights reserved.  Log Roll    Lying on back, bend left knee and place left arm across chest. Roll all in one movement to the right. Reverse to roll to the left. Always move as one unit. Do not hold breath.  Breath out and pull up pelvic floor.   Copyright  VHI. All rights reserved.  Getting Into / Out of Bed    Lower self to lie down on one side by raising legs and lowering head at the same time. Use arms to assist moving without twisting. Bend both knees to roll onto back if desired. To sit up, start from lying on side, and use same move-ments in reverse. Keep trunk aligned with legs. Breath out, contract abdominals, and do not hold breath.  Copyright  VHI. All rights reserved.  Stand to Sit / Sit to Stand    To sit: Bend knees to lower self onto front edge of chair, then scoot back on seat. To stand: Reverse sequence by placing one foot forward, and scoot to front of seat. Use rocking motion to stand up. Breath out and pull up pelvic floor.  Copyright  VHI. All rights reserved.  Lifting Principles  .Maintain proper posture and head alignment. .Slide object as close as possible before lifting. .Move obstacles out of the way. .Test before lifting; ask for help if too heavy. .Tighten stomach muscles without holding breath. And contract pelvic floor .Use smooth movements; do not jerk. .Use legs to do the work, and pivot with feet. .Distribute the work load symmetrically and close to the center of trunk. .Push instead of pull whenever possible.  Copyright  VHI. All rights reserved.   Can get a penile  pump for the for erection and order from [facility].    Chair Sitting    Sit at edge of seat, spine straight, one leg extended. Put a hand on each thigh and bend forward from the hip, keeping spine straight. Allow hand on extended leg to reach toward toes. Support upper body with other arm. Hold _30__ seconds. Repeat __1_ times per session. Do 1sessions per day.  Copyright  VHI. All rights reserved.  Piriformis Stretch, Sitting    Sit, one ankle on opposite knee, same-side hand on crossed knee. Push down on knee, keeping spine straight. Lean torso forward, with flat back, until tension is felt in hamstrings and gluteals of crossed-leg side. Hold _30__ seconds.  Repeat _1__ times per session. Do _1__ sessions per day.  Copyright  VHI. All rights reserved.  [facility] [address], [place], [place] [address] Phone # [phone] Fax [phone]

## 2016-02-18 ENCOUNTER — Other Ambulatory Visit: Payer: Self-pay

## 2016-02-18 DIAGNOSIS — C61 Malignant neoplasm of prostate: Secondary | ICD-10-CM

## 2016-03-04 ENCOUNTER — Other Ambulatory Visit: Payer: Self-pay | Admitting: *Deleted

## 2016-03-04 ENCOUNTER — Inpatient Hospital Stay: Payer: Medicaid Other | Attending: Radiation Oncology

## 2016-03-04 ENCOUNTER — Ambulatory Visit
Admission: RE | Admit: 2016-03-04 | Discharge: 2016-03-04 | Disposition: A | Discharge status: Home / Self Care | Payer: Medicaid Other | Source: Ambulatory Visit | Attending: Radiation Oncology | Admitting: Radiation Oncology

## 2016-03-04 ENCOUNTER — Encounter: Payer: Self-pay | Admitting: Radiation Oncology

## 2016-03-04 VITALS — BP 140/90 | HR 94 | Temp 98.2°F | Wt 233.5 lb

## 2016-03-04 DIAGNOSIS — Z9079 Acquired absence of other genital organ(s): Secondary | ICD-10-CM | POA: Insufficient documentation

## 2016-03-04 DIAGNOSIS — R002 Palpitations: Secondary | ICD-10-CM | POA: Insufficient documentation

## 2016-03-04 DIAGNOSIS — K219 Gastro-esophageal reflux disease without esophagitis: Secondary | ICD-10-CM | POA: Diagnosis not present

## 2016-03-04 DIAGNOSIS — I2510 Atherosclerotic heart disease of native coronary artery without angina pectoris: Secondary | ICD-10-CM | POA: Insufficient documentation

## 2016-03-04 DIAGNOSIS — F17210 Nicotine dependence, cigarettes, uncomplicated: Secondary | ICD-10-CM | POA: Insufficient documentation

## 2016-03-04 DIAGNOSIS — Z510 Encounter for antineoplastic radiation therapy: Secondary | ICD-10-CM | POA: Diagnosis not present

## 2016-03-04 DIAGNOSIS — F39 Unspecified mood [affective] disorder: Secondary | ICD-10-CM | POA: Insufficient documentation

## 2016-03-04 DIAGNOSIS — R32 Unspecified urinary incontinence: Secondary | ICD-10-CM | POA: Insufficient documentation

## 2016-03-04 DIAGNOSIS — C61 Malignant neoplasm of prostate: Secondary | ICD-10-CM | POA: Insufficient documentation

## 2016-03-04 DIAGNOSIS — I10 Essential (primary) hypertension: Secondary | ICD-10-CM | POA: Diagnosis not present

## 2016-03-04 DIAGNOSIS — Z8673 Personal history of transient ischemic attack (TIA), and cerebral infarction without residual deficits: Secondary | ICD-10-CM | POA: Insufficient documentation

## 2016-03-04 DIAGNOSIS — N529 Male erectile dysfunction, unspecified: Secondary | ICD-10-CM | POA: Insufficient documentation

## 2016-03-04 DIAGNOSIS — R51 Headache: Secondary | ICD-10-CM | POA: Insufficient documentation

## 2016-03-04 DIAGNOSIS — Z87828 Personal history of other (healed) physical injury and trauma: Secondary | ICD-10-CM | POA: Diagnosis not present

## 2016-03-04 DIAGNOSIS — Z809 Family history of malignant neoplasm, unspecified: Secondary | ICD-10-CM | POA: Insufficient documentation

## 2016-03-04 DIAGNOSIS — C779 Secondary and unspecified malignant neoplasm of lymph node, unspecified: Secondary | ICD-10-CM | POA: Diagnosis not present

## 2016-03-04 DIAGNOSIS — G4700 Insomnia, unspecified: Secondary | ICD-10-CM | POA: Diagnosis not present

## 2016-03-04 DIAGNOSIS — Z79899 Other long term (current) drug therapy: Secondary | ICD-10-CM | POA: Diagnosis not present

## 2016-03-04 LAB — PSA: PSA: 1.05 ng/mL (ref 0.00–4.00)
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
## 2016-03-05 ENCOUNTER — Ambulatory Visit
Admission: RE | Admit: 2016-03-05 | Discharge: 2016-03-05 | Disposition: A | Discharge status: Home / Self Care | Payer: Medicaid Other | Source: Ambulatory Visit | Attending: Radiation Oncology | Admitting: Radiation Oncology

## 2016-03-05 DIAGNOSIS — Z510 Encounter for antineoplastic radiation therapy: Secondary | ICD-10-CM | POA: Diagnosis not present

## 2016-03-11 DIAGNOSIS — Z510 Encounter for antineoplastic radiation therapy: Secondary | ICD-10-CM | POA: Diagnosis not present

## 2016-03-12 ENCOUNTER — Other Ambulatory Visit: Payer: Self-pay | Admitting: *Deleted

## 2016-03-12 DIAGNOSIS — Z510 Encounter for antineoplastic radiation therapy: Secondary | ICD-10-CM | POA: Diagnosis not present

## 2016-03-12 DIAGNOSIS — C61 Malignant neoplasm of prostate: Secondary | ICD-10-CM

## 2016-03-16 DIAGNOSIS — Z510 Encounter for antineoplastic radiation therapy: Secondary | ICD-10-CM | POA: Diagnosis not present

## 2016-03-17 ENCOUNTER — Ambulatory Visit
Admission: RE | Admit: 2016-03-17 | Discharge: 2016-03-17 | Disposition: A | Discharge status: Home / Self Care | Payer: Medicaid Other | Source: Ambulatory Visit | Attending: Radiation Oncology | Admitting: Radiation Oncology

## 2016-03-18 ENCOUNTER — Ambulatory Visit
Admission: RE | Admit: 2016-03-18 | Discharge: 2016-03-18 | Disposition: A | Discharge status: Home / Self Care | Payer: Medicaid Other | Source: Ambulatory Visit | Attending: Radiation Oncology | Admitting: Radiation Oncology

## 2016-03-18 DIAGNOSIS — Z510 Encounter for antineoplastic radiation therapy: Secondary | ICD-10-CM | POA: Diagnosis not present

## 2016-03-19 ENCOUNTER — Ambulatory Visit
Admission: RE | Admit: 2016-03-19 | Discharge: 2016-03-19 | Disposition: A | Discharge status: Home / Self Care | Payer: Medicaid Other | Source: Ambulatory Visit | Attending: Radiation Oncology | Admitting: Radiation Oncology

## 2016-03-19 DIAGNOSIS — Z510 Encounter for antineoplastic radiation therapy: Secondary | ICD-10-CM | POA: Diagnosis not present

## 2016-03-20 ENCOUNTER — Ambulatory Visit
Admission: RE | Admit: 2016-03-20 | Discharge: 2016-03-20 | Disposition: A | Discharge status: Home / Self Care | Payer: Medicaid Other | Source: Ambulatory Visit | Attending: Radiation Oncology | Admitting: Radiation Oncology

## 2016-03-20 DIAGNOSIS — Z510 Encounter for antineoplastic radiation therapy: Secondary | ICD-10-CM | POA: Diagnosis not present

## 2016-03-23 ENCOUNTER — Ambulatory Visit
Admission: RE | Admit: 2016-03-23 | Discharge: 2016-03-23 | Disposition: A | Discharge status: Home / Self Care | Payer: Medicaid Other | Source: Ambulatory Visit | Attending: Radiation Oncology | Admitting: Radiation Oncology

## 2016-03-23 DIAGNOSIS — Z510 Encounter for antineoplastic radiation therapy: Secondary | ICD-10-CM | POA: Diagnosis not present

## 2016-03-24 ENCOUNTER — Ambulatory Visit
Admission: RE | Admit: 2016-03-24 | Discharge: 2016-03-24 | Disposition: A | Discharge status: Home / Self Care | Payer: Medicaid Other | Source: Ambulatory Visit | Attending: Radiation Oncology | Admitting: Radiation Oncology

## 2016-03-24 ENCOUNTER — Other Ambulatory Visit: Payer: Self-pay | Admitting: *Deleted

## 2016-03-24 DIAGNOSIS — C61 Malignant neoplasm of prostate: Secondary | ICD-10-CM

## 2016-03-24 DIAGNOSIS — Z510 Encounter for antineoplastic radiation therapy: Secondary | ICD-10-CM | POA: Diagnosis not present

## 2016-03-25 ENCOUNTER — Ambulatory Visit
Admission: RE | Admit: 2016-03-25 | Discharge: 2016-03-25 | Disposition: A | Discharge status: Home / Self Care | Payer: Medicaid Other | Source: Ambulatory Visit | Attending: Radiation Oncology | Admitting: Radiation Oncology

## 2016-03-25 DIAGNOSIS — Z510 Encounter for antineoplastic radiation therapy: Secondary | ICD-10-CM | POA: Diagnosis not present

## 2016-03-26 ENCOUNTER — Ambulatory Visit
Admission: RE | Admit: 2016-03-26 | Discharge: 2016-03-26 | Disposition: A | Discharge status: Home / Self Care | Payer: Medicaid Other | Source: Ambulatory Visit | Attending: Radiation Oncology | Admitting: Radiation Oncology

## 2016-03-26 DIAGNOSIS — Z510 Encounter for antineoplastic radiation therapy: Secondary | ICD-10-CM | POA: Diagnosis not present

## 2016-03-27 ENCOUNTER — Ambulatory Visit
Admission: RE | Admit: 2016-03-27 | Discharge: 2016-03-27 | Disposition: A | Discharge status: Home / Self Care | Payer: Medicaid Other | Source: Ambulatory Visit | Attending: Radiation Oncology | Admitting: Radiation Oncology

## 2016-03-27 ENCOUNTER — Inpatient Hospital Stay: Payer: Medicaid Other | Attending: Radiation Oncology

## 2016-03-27 DIAGNOSIS — Z510 Encounter for antineoplastic radiation therapy: Secondary | ICD-10-CM | POA: Diagnosis not present

## 2016-03-27 DIAGNOSIS — Z79818 Long term (current) use of other agents affecting estrogen receptors and estrogen levels: Secondary | ICD-10-CM | POA: Insufficient documentation

## 2016-03-27 DIAGNOSIS — C61 Malignant neoplasm of prostate: Secondary | ICD-10-CM | POA: Insufficient documentation

## 2016-03-31 ENCOUNTER — Ambulatory Visit
Admission: RE | Admit: 2016-03-31 | Discharge: 2016-03-31 | Disposition: A | Discharge status: Home / Self Care | Payer: Medicaid Other | Source: Ambulatory Visit | Attending: Radiation Oncology | Admitting: Radiation Oncology

## 2016-03-31 ENCOUNTER — Other Ambulatory Visit: Payer: Self-pay | Admitting: Family Medicine

## 2016-03-31 ENCOUNTER — Other Ambulatory Visit: Payer: Self-pay

## 2016-03-31 DIAGNOSIS — Z510 Encounter for antineoplastic radiation therapy: Secondary | ICD-10-CM | POA: Diagnosis not present

## 2016-03-31 MED ORDER — ZOLPIDEM TARTRATE 10 MG PO TABS: 10.0000 mg | ORAL_TABLET | Freq: Every evening | ORAL | 2 refills | Status: DC | PRN

## 2016-03-31 NOTE — Telephone Encounter (Signed)
ambien refilled.   Laroy Apple, MD [facility] 03/31/2016, 5:48 PM

## 2016-04-01 ENCOUNTER — Ambulatory Visit
Admission: RE | Admit: 2016-04-01 | Discharge: 2016-04-01 | Disposition: A | Discharge status: Home / Self Care | Payer: Medicaid Other | Source: Ambulatory Visit | Attending: Radiation Oncology | Admitting: Radiation Oncology

## 2016-04-01 DIAGNOSIS — Z510 Encounter for antineoplastic radiation therapy: Secondary | ICD-10-CM | POA: Diagnosis not present

## 2016-04-01 NOTE — Telephone Encounter (Signed)
Pt aware of refills & appt set for 3 mos from now

## 2016-04-02 ENCOUNTER — Ambulatory Visit
Admission: RE | Admit: 2016-04-02 | Discharge: 2016-04-02 | Disposition: A | Discharge status: Home / Self Care | Payer: Medicaid Other | Source: Ambulatory Visit | Attending: Radiation Oncology | Admitting: Radiation Oncology

## 2016-04-02 ENCOUNTER — Telehealth: Payer: Self-pay | Admitting: Oncology

## 2016-04-02 ENCOUNTER — Encounter: Payer: Self-pay | Admitting: Oncology

## 2016-04-02 ENCOUNTER — Inpatient Hospital Stay: Payer: Medicaid Other

## 2016-04-02 DIAGNOSIS — Z510 Encounter for antineoplastic radiation therapy: Secondary | ICD-10-CM | POA: Diagnosis not present

## 2016-04-02 NOTE — Telephone Encounter (Signed)
Pt returned call and confirmed appt for 10/17 with Dr. Alen Blew, completed intake, mailed pt letter, faxed referring provider appt date/time

## 2016-04-03 ENCOUNTER — Ambulatory Visit
Admission: RE | Admit: 2016-04-03 | Discharge: 2016-04-03 | Disposition: A | Discharge status: Home / Self Care | Payer: Medicaid Other | Source: Ambulatory Visit | Attending: Radiation Oncology | Admitting: Radiation Oncology

## 2016-04-03 DIAGNOSIS — Z510 Encounter for antineoplastic radiation therapy: Secondary | ICD-10-CM | POA: Diagnosis not present

## 2016-04-06 ENCOUNTER — Ambulatory Visit
Admission: RE | Admit: 2016-04-06 | Discharge: 2016-04-06 | Disposition: A | Discharge status: Home / Self Care | Payer: Medicaid Other | Source: Ambulatory Visit | Attending: Radiation Oncology | Admitting: Radiation Oncology

## 2016-04-06 ENCOUNTER — Other Ambulatory Visit: Payer: Self-pay | Admitting: *Deleted

## 2016-04-06 DIAGNOSIS — Z510 Encounter for antineoplastic radiation therapy: Secondary | ICD-10-CM | POA: Diagnosis not present

## 2016-04-06 MED ORDER — PROMETHAZINE HCL 25 MG PO TABS: 25.0000 mg | ORAL_TABLET | Freq: Four times a day (QID) | ORAL | 0 refills | Status: DC | PRN

## 2016-04-07 ENCOUNTER — Ambulatory Visit: Payer: Medicaid Other

## 2016-04-08 ENCOUNTER — Other Ambulatory Visit: Payer: Self-pay | Admitting: *Deleted

## 2016-04-08 ENCOUNTER — Ambulatory Visit
Admission: RE | Admit: 2016-04-08 | Discharge: 2016-04-08 | Disposition: A | Discharge status: Home / Self Care | Payer: Medicaid Other | Source: Ambulatory Visit | Attending: Radiation Oncology | Admitting: Radiation Oncology

## 2016-04-08 DIAGNOSIS — Z510 Encounter for antineoplastic radiation therapy: Secondary | ICD-10-CM | POA: Diagnosis not present

## 2016-04-09 ENCOUNTER — Ambulatory Visit
Admission: RE | Admit: 2016-04-09 | Discharge: 2016-04-09 | Disposition: A | Discharge status: Home / Self Care | Payer: Medicaid Other | Source: Ambulatory Visit | Attending: Radiation Oncology | Admitting: Radiation Oncology

## 2016-04-09 DIAGNOSIS — Z510 Encounter for antineoplastic radiation therapy: Secondary | ICD-10-CM | POA: Diagnosis not present

## 2016-04-10 ENCOUNTER — Inpatient Hospital Stay: Payer: Medicaid Other

## 2016-04-10 ENCOUNTER — Ambulatory Visit
Admission: RE | Admit: 2016-04-10 | Discharge: 2016-04-10 | Disposition: A | Discharge status: Home / Self Care | Payer: Medicaid Other | Source: Ambulatory Visit | Attending: Radiation Oncology | Admitting: Radiation Oncology

## 2016-04-10 DIAGNOSIS — C61 Malignant neoplasm of prostate: Secondary | ICD-10-CM

## 2016-04-10 DIAGNOSIS — Z79818 Long term (current) use of other agents affecting estrogen receptors and estrogen levels: Secondary | ICD-10-CM | POA: Diagnosis not present

## 2016-04-10 DIAGNOSIS — Z510 Encounter for antineoplastic radiation therapy: Secondary | ICD-10-CM | POA: Diagnosis not present

## 2016-04-10 MED ORDER — LEUPROLIDE ACETATE (4 MONTH) 30 MG IM KIT
30.0000 mg | PACK | Freq: Once | INTRAMUSCULAR | Status: AC
  Administered 2016-04-10: 30 mg via INTRAMUSCULAR
  Filled 2016-04-10: qty 30

## 2016-04-13 ENCOUNTER — Ambulatory Visit
Admission: RE | Admit: 2016-04-13 | Discharge: 2016-04-13 | Disposition: A | Discharge status: Home / Self Care | Payer: Medicaid Other | Source: Ambulatory Visit | Attending: Radiation Oncology | Admitting: Radiation Oncology

## 2016-04-13 DIAGNOSIS — Z510 Encounter for antineoplastic radiation therapy: Secondary | ICD-10-CM | POA: Diagnosis not present

## 2016-04-14 ENCOUNTER — Ambulatory Visit
Admission: RE | Admit: 2016-04-14 | Discharge: 2016-04-14 | Disposition: A | Discharge status: Home / Self Care | Payer: Medicaid Other | Source: Ambulatory Visit | Attending: Radiation Oncology | Admitting: Radiation Oncology

## 2016-04-14 DIAGNOSIS — Z510 Encounter for antineoplastic radiation therapy: Secondary | ICD-10-CM | POA: Diagnosis not present

## 2016-04-15 ENCOUNTER — Ambulatory Visit
Admission: RE | Admit: 2016-04-15 | Discharge: 2016-04-15 | Disposition: A | Discharge status: Home / Self Care | Payer: Medicaid Other | Source: Ambulatory Visit | Attending: Radiation Oncology | Admitting: Radiation Oncology

## 2016-04-15 DIAGNOSIS — Z510 Encounter for antineoplastic radiation therapy: Secondary | ICD-10-CM | POA: Diagnosis not present

## 2016-04-16 ENCOUNTER — Inpatient Hospital Stay: Payer: Medicaid Other

## 2016-04-16 ENCOUNTER — Ambulatory Visit
Admission: RE | Admit: 2016-04-16 | Discharge: 2016-04-16 | Disposition: A | Discharge status: Home / Self Care | Payer: Medicaid Other | Source: Ambulatory Visit | Attending: Radiation Oncology | Admitting: Radiation Oncology

## 2016-04-16 DIAGNOSIS — Z510 Encounter for antineoplastic radiation therapy: Secondary | ICD-10-CM | POA: Diagnosis not present

## 2016-04-17 ENCOUNTER — Ambulatory Visit
Admission: RE | Admit: 2016-04-17 | Discharge: 2016-04-17 | Disposition: A | Discharge status: Home / Self Care | Payer: Medicaid Other | Source: Ambulatory Visit | Attending: Radiation Oncology | Admitting: Radiation Oncology

## 2016-04-17 DIAGNOSIS — Z510 Encounter for antineoplastic radiation therapy: Secondary | ICD-10-CM | POA: Diagnosis not present

## 2016-04-20 ENCOUNTER — Ambulatory Visit
Admission: RE | Admit: 2016-04-20 | Discharge: 2016-04-20 | Disposition: A | Discharge status: Home / Self Care | Payer: Medicaid Other | Source: Ambulatory Visit | Attending: Radiation Oncology | Admitting: Radiation Oncology

## 2016-04-20 DIAGNOSIS — Z510 Encounter for antineoplastic radiation therapy: Secondary | ICD-10-CM | POA: Diagnosis not present

## 2016-04-21 ENCOUNTER — Ambulatory Visit
Admission: RE | Admit: 2016-04-21 | Discharge: 2016-04-21 | Disposition: A | Discharge status: Home / Self Care | Payer: Medicaid Other | Source: Ambulatory Visit | Attending: Radiation Oncology | Admitting: Radiation Oncology

## 2016-04-21 DIAGNOSIS — Z510 Encounter for antineoplastic radiation therapy: Secondary | ICD-10-CM | POA: Diagnosis not present

## 2016-04-22 ENCOUNTER — Ambulatory Visit
Admission: RE | Admit: 2016-04-22 | Discharge: 2016-04-22 | Disposition: A | Discharge status: Home / Self Care | Payer: Medicaid Other | Source: Ambulatory Visit | Attending: Radiation Oncology | Admitting: Radiation Oncology

## 2016-04-22 DIAGNOSIS — Z510 Encounter for antineoplastic radiation therapy: Secondary | ICD-10-CM | POA: Diagnosis not present

## 2016-04-23 ENCOUNTER — Ambulatory Visit
Admission: RE | Admit: 2016-04-23 | Discharge: 2016-04-23 | Disposition: A | Discharge status: Home / Self Care | Payer: Medicaid Other | Source: Ambulatory Visit | Attending: Radiation Oncology | Admitting: Radiation Oncology

## 2016-04-23 DIAGNOSIS — Z510 Encounter for antineoplastic radiation therapy: Secondary | ICD-10-CM | POA: Diagnosis not present

## 2016-04-24 ENCOUNTER — Ambulatory Visit: Payer: Medicaid Other

## 2016-04-27 ENCOUNTER — Ambulatory Visit: Payer: Medicaid Other

## 2016-04-28 ENCOUNTER — Ambulatory Visit: Payer: Medicaid Other

## 2016-04-29 ENCOUNTER — Ambulatory Visit
Admission: RE | Admit: 2016-04-29 | Discharge: 2016-04-29 | Disposition: A | Discharge status: Home / Self Care | Payer: Medicaid Other | Source: Ambulatory Visit | Attending: Radiation Oncology | Admitting: Radiation Oncology

## 2016-04-29 ENCOUNTER — Other Ambulatory Visit: Payer: Self-pay | Admitting: Family Medicine

## 2016-04-29 DIAGNOSIS — Z510 Encounter for antineoplastic radiation therapy: Secondary | ICD-10-CM | POA: Diagnosis not present

## 2016-04-30 ENCOUNTER — Ambulatory Visit: Payer: Medicaid Other | Admitting: Family Medicine

## 2016-04-30 ENCOUNTER — Ambulatory Visit
Admission: RE | Admit: 2016-04-30 | Discharge: 2016-04-30 | Disposition: A | Discharge status: Home / Self Care | Payer: Medicaid Other | Source: Ambulatory Visit | Attending: Radiation Oncology | Admitting: Radiation Oncology

## 2016-04-30 ENCOUNTER — Inpatient Hospital Stay: Payer: Medicaid Other | Attending: Radiation Oncology

## 2016-04-30 DIAGNOSIS — Z510 Encounter for antineoplastic radiation therapy: Secondary | ICD-10-CM | POA: Diagnosis not present

## 2016-05-01 ENCOUNTER — Ambulatory Visit: Payer: Medicaid Other

## 2016-05-04 ENCOUNTER — Encounter: Payer: Self-pay | Admitting: Family Medicine

## 2016-05-04 ENCOUNTER — Ambulatory Visit: Payer: Medicaid Other

## 2016-05-05 ENCOUNTER — Ambulatory Visit: Payer: Medicaid Other

## 2016-05-06 ENCOUNTER — Ambulatory Visit: Payer: Medicaid Other

## 2016-05-06 ENCOUNTER — Encounter: Payer: Self-pay | Admitting: Family Medicine

## 2016-05-06 ENCOUNTER — Ambulatory Visit (INDEPENDENT_AMBULATORY_CARE_PROVIDER_SITE_OTHER): Payer: Medicaid Other | Admitting: Family Medicine

## 2016-05-06 VITALS — BP 105/69 | HR 68 | Temp 97.4°F | Ht 74.0 in | Wt 220.4 lb

## 2016-05-06 DIAGNOSIS — C61 Malignant neoplasm of prostate: Secondary | ICD-10-CM | POA: Diagnosis not present

## 2016-05-06 DIAGNOSIS — I10 Essential (primary) hypertension: Secondary | ICD-10-CM | POA: Diagnosis not present

## 2016-05-06 NOTE — Progress Notes (Signed)
   HPI  Patient presents today here for discussion about his sulci recheck.  Patient explains that through a mistake in his paperwork he has had to have someone else receive his Social Security check. He states he needs a letter stating that he can receive it.  He takes his blood pressure medication daily. No chest pain, dyspnea, palpitations.  He is currently being treated for prostate cancer, he's tolerating treatments well. He's had a radical prostatectomy and currently taking radiation therapy.  PMH: Smoking status noted ROS: Per HPI  Objective: BP 105/69   Pulse 68   Temp 97.4 F (36.3 C) (Oral)   Ht 6\' 2"  (1.88 m)   Wt 220 lb 6.4 oz (100 kg)   BMI 28.30 kg/m  Gen: NAD, alert, cooperative with exam HEENT: NCAT, EOMI, PERRL CV: RRR, good S1/S2, no murmur Resp: CTABL, no wheezes, non-labored Ext: No edema, warm Neuro: Alert and oriented 4, walking with a cane  Assessment and plan:   Letter of support to receive his own Social Security check was written. I believe he is of sound mind to manage his own finances.  # Hypertension Well-controlled with maxide, labs utd  # prostate cancer Stable, currently receiving treatment- radiation    Calvin Apple, MD [facility] 05/06/2016, 2:41 PM

## 2016-05-07 ENCOUNTER — Ambulatory Visit: Payer: Medicaid Other

## 2016-05-08 ENCOUNTER — Ambulatory Visit: Payer: Medicaid Other

## 2016-05-11 ENCOUNTER — Ambulatory Visit: Payer: Medicaid Other

## 2016-05-11 DIAGNOSIS — Z510 Encounter for antineoplastic radiation therapy: Secondary | ICD-10-CM | POA: Diagnosis not present

## 2016-05-12 ENCOUNTER — Ambulatory Visit: Payer: Medicaid Other | Admitting: Oncology

## 2016-05-12 ENCOUNTER — Ambulatory Visit: Payer: Medicaid Other

## 2016-05-12 DIAGNOSIS — Z510 Encounter for antineoplastic radiation therapy: Secondary | ICD-10-CM | POA: Diagnosis not present

## 2016-05-13 ENCOUNTER — Ambulatory Visit: Admission: RE | Admit: 2016-05-13 | Payer: Medicaid Other | Source: Ambulatory Visit

## 2016-05-14 ENCOUNTER — Ambulatory Visit: Payer: Medicaid Other

## 2016-05-15 ENCOUNTER — Ambulatory Visit: Payer: Medicaid Other

## 2016-05-18 ENCOUNTER — Ambulatory Visit: Payer: Medicaid Other

## 2016-05-19 ENCOUNTER — Ambulatory Visit
Admission: RE | Admit: 2016-05-19 | Discharge: 2016-05-19 | Disposition: A | Discharge status: Home / Self Care | Payer: Medicaid Other | Source: Ambulatory Visit | Attending: Radiation Oncology | Admitting: Radiation Oncology

## 2016-05-19 ENCOUNTER — Other Ambulatory Visit: Payer: Self-pay | Admitting: *Deleted

## 2016-05-19 DIAGNOSIS — Z510 Encounter for antineoplastic radiation therapy: Secondary | ICD-10-CM | POA: Diagnosis not present

## 2016-05-19 MED ORDER — PROMETHAZINE HCL 25 MG PO TABS: 25.0000 mg | ORAL_TABLET | Freq: Four times a day (QID) | ORAL | 0 refills | Status: DC | PRN

## 2016-05-20 ENCOUNTER — Telehealth: Payer: Self-pay

## 2016-05-20 ENCOUNTER — Telehealth: Payer: Self-pay | Admitting: Family Medicine

## 2016-05-20 ENCOUNTER — Ambulatory Visit
Admission: RE | Admit: 2016-05-20 | Discharge: 2016-05-20 | Disposition: A | Discharge status: Home / Self Care | Payer: Medicaid Other | Source: Ambulatory Visit | Attending: Radiation Oncology | Admitting: Radiation Oncology

## 2016-05-20 DIAGNOSIS — Z510 Encounter for antineoplastic radiation therapy: Secondary | ICD-10-CM | POA: Diagnosis not present

## 2016-05-20 NOTE — Telephone Encounter (Signed)
Phone call from pt.  Reported his "heart is acting up."  Stated he was at an appt. With his Radiation Oncologist yesterday, and started shaking; the nurse that checked him said he had an irregular heart beat, and advised him to see his Cardiologist.  Was also informed his BP was elevated, and he should report that to his PCP.   Reported he called EMS earlier today, and had checked out okay.  Reported he is "a little short of breath."  Denied chest pain.  Advised that he will need to call Dr. Harl Bowie, his Cardiologist.  The pt. stated he thought Dr. Oneida Alar was his Cardiologist.  Advised that Dr. Oneida Alar follows his Carotid Artery Disease.  Provided pt. with Dr. Nelly Laurence office number.  Encouraged pt. to call 911, if he feels worse, or if heart begins beating fast/irregular, and if he has worsening SOB or chest pain.  Pt. Verb. Understanding.

## 2016-05-21 ENCOUNTER — Ambulatory Visit: Payer: Medicaid Other

## 2016-05-21 ENCOUNTER — Other Ambulatory Visit: Payer: Self-pay

## 2016-05-21 DIAGNOSIS — R079 Chest pain, unspecified: Secondary | ICD-10-CM

## 2016-05-21 NOTE — Telephone Encounter (Signed)
He was referred in 09/2015, I am urprised he ha snot been seen yet.   I am ok with placing him at a different clinic if that's what he is asking for> [facility] in [place] would be reasonable and close. If he'd like another office Dr. Einar Gip would be good.   Laroy Apple, MD [facility] 05/21/2016, 10:36 AM

## 2016-05-21 NOTE — Telephone Encounter (Signed)
He was scheduled twice in April for Cardiology  I will put a new referral in

## 2016-05-21 NOTE — Telephone Encounter (Signed)
Please address

## 2016-05-22 ENCOUNTER — Ambulatory Visit: Payer: Medicaid Other

## 2016-05-25 ENCOUNTER — Ambulatory Visit: Payer: Medicaid Other

## 2016-05-26 ENCOUNTER — Telehealth: Payer: Self-pay | Admitting: Family Medicine

## 2016-05-26 ENCOUNTER — Ambulatory Visit (INDEPENDENT_AMBULATORY_CARE_PROVIDER_SITE_OTHER): Payer: Medicaid Other | Admitting: Family Medicine

## 2016-05-26 ENCOUNTER — Other Ambulatory Visit: Payer: Self-pay | Admitting: Family Medicine

## 2016-05-26 ENCOUNTER — Encounter: Payer: Self-pay | Admitting: Family Medicine

## 2016-05-26 ENCOUNTER — Ambulatory Visit: Payer: Medicaid Other

## 2016-05-26 VITALS — BP 122/83 | HR 76 | Temp 97.4°F | Ht 74.0 in | Wt 218.0 lb

## 2016-05-26 DIAGNOSIS — H538 Other visual disturbances: Secondary | ICD-10-CM

## 2016-05-26 DIAGNOSIS — R002 Palpitations: Secondary | ICD-10-CM

## 2016-05-26 DIAGNOSIS — F411 Generalized anxiety disorder: Secondary | ICD-10-CM

## 2016-05-26 DIAGNOSIS — R42 Dizziness and giddiness: Secondary | ICD-10-CM

## 2016-05-26 DIAGNOSIS — I10 Essential (primary) hypertension: Secondary | ICD-10-CM | POA: Diagnosis not present

## 2016-05-26 DIAGNOSIS — I6389 Other cerebral infarction: Secondary | ICD-10-CM

## 2016-05-26 MED ORDER — HYDROCHLOROTHIAZIDE 12.5 MG PO CAPS: 12.5000 mg | ORAL_CAPSULE | Freq: Every day | ORAL | 3 refills | Status: DC

## 2016-05-26 MED ORDER — METOPROLOL SUCCINATE ER 50 MG PO TB24: 50.0000 mg | ORAL_TABLET | Freq: Every day | ORAL | 2 refills | Status: DC

## 2016-05-26 MED ORDER — DULOXETINE HCL 20 MG PO CPEP: 20.0000 mg | ORAL_CAPSULE | Freq: Every day | ORAL | 1 refills | Status: DC

## 2016-05-26 NOTE — Progress Notes (Signed)
 HPI  Patient presents today with palpitations and anxiety.  Patient explains that he has palpitations perceived as rapid heartbeat frequently over the last 2 weeks. He states it's happening on a daily basis several times a day. It's accompanied with feelings of panic. He previously tried citalopram and states that it gave him an upset stomach. He denies suicidal thoughts. He does have anxiety.  He was going to be worked in sooner to cardiology, however he was unable to go to schedule the offered appointment due to a friend's mother dying and having go to the funeral.  Patient states he's had dizziness, weakness, associated with the palpitations.  Patient states his blood pressures frequently 150/90, however most the time is normal.  He has had dizziness over the last 6 months it seems be getting worse. At first it happened only when he stood up, now it's happening when he sits and when he stands. He denies any room spinning sensation, weakness, numbness, or tingling.  He denies chest pain  PMH: Smoking status noted ROS: Per HPI  Objective: BP 122/83   Pulse 76   Temp 97.4 F (36.3 C) (Oral)   Ht 6' 2" (1.88 m)   Wt 218 lb (98.9 kg)   BMI 27.99 kg/m  Gen: NAD, alert, cooperative with exam HEENT: NCAT, EOMI, PERRL CV: RRR, good S1/S2, no murmur Resp: CTABL, no wheezes, non-labored Ext: No edema, warm Neuro: Alert and oriented, strength 5/5 and sensation intact in all 4 extremities, cranial nerves II through XII intact  EKG: Normal sinus rhythm   Assessment and plan:  # Palpitations Possibly anxiety related, also conversely his anxiety could been responsive palpitations Start metoprolol Discussed: Cardiology again to be worked in sooner than he has scheduled.  # Anxiety With feelings of panic associated with palpitations, or vice versa. Starting Cymbalta very low dose today with previous intolerance of Wellbutrin and citalopram. Denies suicidal thoughts Adding  benzodiazepines for now considering that he is already on chronic narcotics  # Dizziness Dizziness and blurred vision Intermittent His blood pressure is normal, and he reports normal blood pressures at home. Symptoms are at rest and with standing Considering history of prostate cancer I think the most prudent step is MRI of the brain to rule out possible metastasis.   # Hypertension Well-controlled today Given dizziness I do not want to position tomorrow. I feel metoprolol has an important addition due to the palpitations Start metoprolol, stop Maxide Start HCTZ in 3-4 days if blood pressures begin to go over 150 systolic.  F/iu 3-4 weeks     Orders Placed This Encounter  Procedures  . MR Brain W Wo Contrast    Standing Status:   Future    Standing Expiration Date:   07/26/2017    Order Specific Question:   If indicated for the ordered procedure, I authorize the administration of contrast media per Radiology protocol    Answer:   Yes    Order Specific Question:   Reason for Exam (SYMPTOM  OR DIAGNOSIS REQUIRED)    Answer:   dizziness, blurred vision with HX of prostate Ca, r/o met    Order Specific Question:   Preferred imaging location?    Answer:   [facility] (table limit-350lbs)    Order Specific Question:   What is the patient's sedation requirement?    Answer:   No Sedation    Order Specific Question:   Does the patient have a pacemaker or implanted devices?      Answer:   No  . EKG 12-Lead     Sam Bradshaw, MD [facility] 05/26/2016, 11:44 AM     

## 2016-05-26 NOTE — Patient Instructions (Signed)
Great to see you!   Stop him to ream/HCTZ, start metoprolol 1 pill once daily If your blood pressure is starting to get elevated again in a few days, start 12.5 mg HCTZ.  I have arranged for an MRI to evaluate your brain for the dizziness.  Please come back in 3-4 weeks for follow-up anxiety and palpitations.

## 2016-05-27 ENCOUNTER — Ambulatory Visit: Payer: Medicaid Other

## 2016-05-27 NOTE — Telephone Encounter (Signed)
He has a current [employer], so not sure which he is supposed to be taking, or if he was to use both, will forward to Dr. Wendi Snipes.  Also, got last flexeril on 04/29/16, so even if taken TID, he should be good until 05/29/16.  Will forward to Dr. Wendi Snipes.

## 2016-05-28 ENCOUNTER — Ambulatory Visit: Payer: Medicaid Other | Admitting: Family Medicine

## 2016-05-28 ENCOUNTER — Ambulatory Visit: Payer: Medicaid Other

## 2016-05-28 ENCOUNTER — Other Ambulatory Visit: Payer: Self-pay | Admitting: *Deleted

## 2016-05-29 ENCOUNTER — Ambulatory Visit: Payer: Medicaid Other

## 2016-06-01 ENCOUNTER — Ambulatory Visit
Admission: RE | Admit: 2016-06-01 | Discharge: 2016-06-01 | Disposition: A | Discharge status: Home / Self Care | Payer: Medicaid Other | Source: Ambulatory Visit | Attending: Radiation Oncology | Admitting: Radiation Oncology

## 2016-06-01 DIAGNOSIS — Z510 Encounter for antineoplastic radiation therapy: Secondary | ICD-10-CM | POA: Diagnosis not present

## 2016-06-02 ENCOUNTER — Other Ambulatory Visit: Payer: Self-pay | Admitting: *Deleted

## 2016-06-02 ENCOUNTER — Ambulatory Visit
Admission: RE | Admit: 2016-06-02 | Discharge: 2016-06-02 | Disposition: A | Discharge status: Home / Self Care | Payer: Medicaid Other | Source: Ambulatory Visit | Attending: Radiation Oncology | Admitting: Radiation Oncology

## 2016-06-02 DIAGNOSIS — Z510 Encounter for antineoplastic radiation therapy: Secondary | ICD-10-CM | POA: Diagnosis not present

## 2016-06-03 ENCOUNTER — Other Ambulatory Visit: Payer: Self-pay | Admitting: *Deleted

## 2016-06-03 ENCOUNTER — Ambulatory Visit
Admission: RE | Admit: 2016-06-03 | Discharge: 2016-06-03 | Disposition: A | Discharge status: Home / Self Care | Payer: Medicaid Other | Source: Ambulatory Visit | Attending: Radiation Oncology | Admitting: Radiation Oncology

## 2016-06-03 DIAGNOSIS — Z510 Encounter for antineoplastic radiation therapy: Secondary | ICD-10-CM | POA: Diagnosis not present

## 2016-06-03 MED ORDER — PROMETHAZINE HCL 25 MG PO TABS: 25.0000 mg | ORAL_TABLET | Freq: Four times a day (QID) | ORAL | 0 refills | Status: DC | PRN

## 2016-06-04 ENCOUNTER — Ambulatory Visit
Admission: RE | Admit: 2016-06-04 | Discharge: 2016-06-04 | Disposition: A | Discharge status: Home / Self Care | Payer: Medicaid Other | Source: Ambulatory Visit | Attending: Radiation Oncology | Admitting: Radiation Oncology

## 2016-06-04 DIAGNOSIS — Z510 Encounter for antineoplastic radiation therapy: Secondary | ICD-10-CM | POA: Diagnosis not present

## 2016-06-05 ENCOUNTER — Ambulatory Visit: Payer: Medicaid Other

## 2016-06-08 ENCOUNTER — Ambulatory Visit: Payer: Medicaid Other

## 2016-06-08 ENCOUNTER — Ambulatory Visit
Admission: RE | Admit: 2016-06-08 | Discharge: 2016-06-08 | Disposition: A | Discharge status: Home / Self Care | Payer: Medicaid Other | Source: Ambulatory Visit | Attending: Radiation Oncology | Admitting: Radiation Oncology

## 2016-06-08 DIAGNOSIS — Z510 Encounter for antineoplastic radiation therapy: Secondary | ICD-10-CM | POA: Diagnosis not present

## 2016-06-09 ENCOUNTER — Ambulatory Visit (HOSPITAL_COMMUNITY)
Admission: RE | Admit: 2016-06-09 | Discharge: 2016-06-09 | Disposition: A | Discharge status: Home / Self Care | Payer: Medicaid Other | Source: Ambulatory Visit | Attending: Family Medicine | Admitting: Family Medicine

## 2016-06-09 ENCOUNTER — Ambulatory Visit
Admission: RE | Admit: 2016-06-09 | Discharge: 2016-06-09 | Disposition: A | Discharge status: Home / Self Care | Payer: Medicaid Other | Source: Ambulatory Visit | Attending: Radiation Oncology | Admitting: Radiation Oncology

## 2016-06-09 DIAGNOSIS — H538 Other visual disturbances: Secondary | ICD-10-CM | POA: Diagnosis present

## 2016-06-09 DIAGNOSIS — I639 Cerebral infarction, unspecified: Secondary | ICD-10-CM | POA: Insufficient documentation

## 2016-06-09 DIAGNOSIS — R42 Dizziness and giddiness: Secondary | ICD-10-CM

## 2016-06-09 DIAGNOSIS — Z510 Encounter for antineoplastic radiation therapy: Secondary | ICD-10-CM | POA: Diagnosis not present

## 2016-06-09 LAB — POCT I-STAT CREATININE: CREATININE: 1.3 mg/dL — AB (ref 0.61–1.24)

## 2016-06-09 MED ORDER — GADOBENATE DIMEGLUMINE 529 MG/ML IV SOLN
20.0000 mL | Freq: Once | INTRAVENOUS | Status: AC | PRN
Start: 2016-06-09 — End: 2016-06-09
  Administered 2016-06-09: 20 mL via INTRAVENOUS

## 2016-06-10 ENCOUNTER — Ambulatory Visit
Admission: RE | Admit: 2016-06-10 | Discharge: 2016-06-10 | Disposition: A | Discharge status: Home / Self Care | Payer: Medicaid Other | Source: Ambulatory Visit | Attending: Radiation Oncology | Admitting: Radiation Oncology

## 2016-06-10 ENCOUNTER — Ambulatory Visit: Payer: Medicaid Other

## 2016-06-10 DIAGNOSIS — Z510 Encounter for antineoplastic radiation therapy: Secondary | ICD-10-CM | POA: Diagnosis not present

## 2016-06-10 NOTE — Telephone Encounter (Signed)
Patient aware of results and neurology referral done.

## 2016-06-11 ENCOUNTER — Ambulatory Visit: Payer: Medicaid Other

## 2016-06-12 ENCOUNTER — Encounter: Payer: Self-pay | Admitting: Cardiovascular Disease

## 2016-06-12 ENCOUNTER — Telehealth: Payer: Self-pay | Admitting: Family Medicine

## 2016-06-12 NOTE — Telephone Encounter (Signed)
Patient went to specialists office the other day and blood pressure was extremely low, unsure of exact number, but somewhere in the 80/40 range.  Patient has been taking Metoprolol and HCTZ at bedtime.  I told patient he was only to take the HCTZ if his systolic BP was over [id number].  Patient said he did not have a monitor and could not afford one so he has been taking HCTZ daily.  I advised patient not to take this medication unless needed but that I would speak with you and see if you wanted to make any more changes.  Please advise.

## 2016-06-12 NOTE — Telephone Encounter (Signed)
Patient aware to discontinue HCTZ.  Appointment made for  BP check on 06/19/16 at 8:30 am.

## 2016-06-12 NOTE — Telephone Encounter (Signed)
Lets DC hctz and take only metop, nurse BP check in 1 week.   Laroy Apple, MD [facility] 06/12/2016, 11:18 AM

## 2016-06-15 ENCOUNTER — Ambulatory Visit (INDEPENDENT_AMBULATORY_CARE_PROVIDER_SITE_OTHER): Payer: Medicaid Other

## 2016-06-15 VITALS — BP 105/74 | HR 78

## 2016-06-15 DIAGNOSIS — I10 Essential (primary) hypertension: Secondary | ICD-10-CM

## 2016-06-15 NOTE — Patient Instructions (Signed)
Hypertension Hypertension, commonly called high blood pressure, is when the force of blood pumping through your arteries is too strong. Your arteries are the blood vessels that carry blood from your heart throughout your body. A blood pressure reading consists of a higher number over a lower number, such as 110/72. The higher number (systolic) is the pressure inside your arteries when your heart pumps. The lower number (diastolic) is the pressure inside your arteries when your heart relaxes. Ideally you want your blood pressure below 120/80. Hypertension forces your heart to work harder to pump blood. Your arteries may become narrow or stiff. Having untreated or uncontrolled hypertension can cause heart attack, stroke, kidney disease, and other problems. What increases the risk? Some risk factors for high blood pressure are controllable. Others are not. Risk factors you cannot control include:  Race. You may be at higher risk if you are African American.  Age. Risk increases with age.  Gender. Men are at higher risk than women before age 45 years. After age 65, women are at higher risk than men. Risk factors you can control include:  Not getting enough exercise or physical activity.  Being overweight.  Getting too much fat, sugar, calories, or salt in your diet.  Drinking too much alcohol. What are the signs or symptoms? Hypertension does not usually cause signs or symptoms. Extremely high blood pressure (hypertensive crisis) may cause headache, anxiety, shortness of breath, and nosebleed. How is this diagnosed? To check if you have hypertension, your health care provider will measure your blood pressure while you are seated, with your arm held at the level of your heart. It should be measured at least twice using the same arm. Certain conditions can cause a difference in blood pressure between your right and left arms. A blood pressure reading that is higher than normal on one occasion does  not mean that you need treatment. If it is not clear whether you have high blood pressure, you may be asked to return on a different day to have your blood pressure checked again. Or, you may be asked to monitor your blood pressure at home for 1 or more weeks. How is this treated? Treating high blood pressure includes making lifestyle changes and possibly taking medicine. Living a healthy lifestyle can help lower high blood pressure. You may need to change some of your habits. Lifestyle changes may include:  Following the DASH diet. This diet is high in fruits, vegetables, and whole grains. It is low in salt, red meat, and added sugars.  Keep your sodium intake below 2,300 mg per day.  Getting at least 30-45 minutes of aerobic exercise at least 4 times per week.  Losing weight if necessary.  Not smoking.  Limiting alcoholic beverages.  Learning ways to reduce stress. Your health care provider may prescribe medicine if lifestyle changes are not enough to get your blood pressure under control, and if one of the following is true:  You are 18-59 years of age and your systolic blood pressure is above 140.  You are 60 years of age or older, and your systolic blood pressure is above 150.  Your diastolic blood pressure is above 90.  You have diabetes, and your systolic blood pressure is over 140 or your diastolic blood pressure is over 90.  You have kidney disease and your blood pressure is above 140/90.  You have heart disease and your blood pressure is above 140/90. Your personal target blood pressure may vary depending on your medical   conditions, your age, and other factors. Follow these instructions at home:  Have your blood pressure rechecked as directed by your health care provider.  Take medicines only as directed by your health care provider. Follow the directions carefully. Blood pressure medicines must be taken as prescribed. The medicine does not work as well when you skip  doses. Skipping doses also puts you at risk for problems.  Do not smoke.  Monitor your blood pressure at home as directed by your health care provider. Contact a health care provider if:  You think you are having a reaction to medicines taken.  You have recurrent headaches or feel dizzy.  You have swelling in your ankles.  You have trouble with your vision. Get help right away if:  You develop a severe headache or confusion.  You have unusual weakness, numbness, or feel faint.  You have severe chest or abdominal pain.  You vomit repeatedly.  You have trouble breathing. This information is not intended to replace advice given to you by your health care provider. Make sure you discuss any questions you have with your health care provider. Document Released: 07/13/2005 Document Revised: 12/19/2015 Document Reviewed: 05/05/2013 Elsevier Interactive Patient Education  2017 [employer]vier Inc[employer]  

## 2016-06-15 NOTE — Progress Notes (Signed)
Patient here today to check blood pressure.

## 2016-06-23 ENCOUNTER — Ambulatory Visit: Payer: Medicaid Other | Admitting: Cardiovascular Disease

## 2016-06-25 ENCOUNTER — Ambulatory Visit: Payer: Medicaid Other | Admitting: Family Medicine

## 2016-06-26 ENCOUNTER — Telehealth: Payer: Self-pay | Admitting: Family Medicine

## 2016-06-26 ENCOUNTER — Encounter: Payer: Self-pay | Admitting: Family Medicine

## 2016-06-26 NOTE — Telephone Encounter (Signed)
Patient aware that he missed appoint due to a personal family issue and states he will call back to re schedule.

## 2016-07-06 ENCOUNTER — Other Ambulatory Visit: Payer: Self-pay | Admitting: Family Medicine

## 2016-07-06 ENCOUNTER — Encounter: Payer: Self-pay | Admitting: Radiation Oncology

## 2016-07-06 ENCOUNTER — Ambulatory Visit
Admission: RE | Admit: 2016-07-06 | Discharge: 2016-07-06 | Disposition: A | Discharge status: Home / Self Care | Payer: Medicaid Other | Source: Ambulatory Visit | Attending: Radiation Oncology | Admitting: Radiation Oncology

## 2016-07-06 VITALS — BP 129/78 | HR 72 | Temp 96.1°F | Wt 225.3 lb

## 2016-07-06 DIAGNOSIS — R32 Unspecified urinary incontinence: Secondary | ICD-10-CM | POA: Insufficient documentation

## 2016-07-06 DIAGNOSIS — C61 Malignant neoplasm of prostate: Secondary | ICD-10-CM | POA: Insufficient documentation

## 2016-07-06 DIAGNOSIS — Z923 Personal history of irradiation: Secondary | ICD-10-CM | POA: Diagnosis not present

## 2016-07-06 DIAGNOSIS — Z9079 Acquired absence of other genital organ(s): Secondary | ICD-10-CM | POA: Insufficient documentation

## 2016-07-06 NOTE — Progress Notes (Signed)
Radiation Oncology Follow up Note  Name: Calvin Aguirre   Date:   07/06/2016 MRN:  [id number] DOB: [birth year]    This 55 y.o. male presents to the clinic today for one-month follow-up status post salvage radiation status post prostatectomy.  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 Tumor was a Gleason 7 (4+3) presenting the PSA of 73. Seen today in routine follow-up he is doing well. He states his urinary incontinence has improved he does still wear depends undergarments. He also has some intermittent diarrhea does not follow a low residue diet does not take Imodium..  COMPLICATIONS OF TREATMENT: none  FOLLOW UP COMPLIANCE: keeps appointments   PHYSICAL EXAM:  BP 129/78   Pulse 72   Temp (!) 96.1 F (35.6 C)   Wt 225 lb 5 oz (102.2 kg)   BMI 28.93 kg/m  Well-developed well-nourished patient in NAD. HEENT reveals PERLA, EOMI, discs not visualized.  Oral cavity is clear. No oral mucosal lesions are identified. Neck is clear without evidence of cervical or supraclavicular adenopathy. Lungs are clear to A&P. Cardiac examination is essentially unremarkable with regular rate and rhythm without murmur rub or thrill. Abdomen is benign with no organomegaly or masses noted. Motor sensory and DTR levels are equal and symmetric in the upper and lower extremities. Cranial nerves II through XII are grossly intact. Proprioception is intact. No peripheral adenopathy or edema is identified. No motor or sensory levels are noted. Crude visual fields are within normal range.  RADIOLOGY RESULTS: No current films for review  PLAN: Present time patient is doing well 1 month out with side effect profile improving. I will run a PSA level on him in about 3 months and have  scheduled follow-up at that time. Also given him some undergarments today to use. Otherwise he is doing well patient knows to call sooner with any concerns.  I would like to take this opportunity to thank you for allowing me to participate in the care of your patient.Armstead Peaks., MD

## 2016-07-07 ENCOUNTER — Encounter: Payer: Self-pay | Admitting: Family Medicine

## 2016-07-07 ENCOUNTER — Ambulatory Visit (INDEPENDENT_AMBULATORY_CARE_PROVIDER_SITE_OTHER): Payer: Medicaid Other | Admitting: Family Medicine

## 2016-07-07 VITALS — BP 109/59 | HR 55 | Temp 97.7°F | Ht 74.0 in | Wt 227.2 lb

## 2016-07-07 DIAGNOSIS — Z720 Tobacco use: Secondary | ICD-10-CM

## 2016-07-07 DIAGNOSIS — G8929 Other chronic pain: Secondary | ICD-10-CM | POA: Diagnosis not present

## 2016-07-07 DIAGNOSIS — M549 Dorsalgia, unspecified: Secondary | ICD-10-CM

## 2016-07-07 DIAGNOSIS — I10 Essential (primary) hypertension: Secondary | ICD-10-CM | POA: Diagnosis not present

## 2016-07-07 NOTE — Patient Instructions (Signed)
Great to see you!  Lets plan on following up for routine medical care in 3-4 months

## 2016-07-07 NOTE — Progress Notes (Signed)
   HPI  Patient presents today requesting legal support for personal care services, follow-up hypertension and smoking. Smoking Considering quitting, however is not ready Hypertension Has stopped HCTZ, average is 0000000 systolic at home Denies any chest pain, has good medication compliance otherwise.  Chronic back pain, prostate cancer, and anxiety all provide needed for personal care services. He has difficulty with basic activities of daily living including bathing. Currently the personal care services are giving him 77 hours a month. He states that they have written on paper that they're only giving him 70 hours and that he is being reduced to 61 hours. He states he has a hearing with the judge in 8 days and needs a letter of support previous to that.   PMH: Smoking status noted ROS: Per HPI  Objective: BP (!) 109/59   Pulse (!) 55   Temp 97.7 F (36.5 C) (Oral)   Ht 6\' 2"  (1.88 m)   Wt 227 lb 3.2 oz (103.1 kg)   BMI 29.17 kg/m  Gen: NAD, alert, cooperative with exam HEENT: NCAT CV: RRR, good S1/S2, no murmur Resp: CTABL, no wheezes, non-labored Ext: No edema, warm Neuro: Alert and oriented, walking with a acane  Assessment and plan: Mr. Rahming is a 55 year old man working his way through radiation therapy for prostate cancer. He has a history of chronic back pain which necessitated personal care services to start with several years ago. He has had a recent evaluation at home and has been deemed that he has been given more hours of personal care services and should be allotted. He has requested that I write a letter to a judge to be linear and give him additional hours.  No significant need for PCS appears to be difficulty with ADLs, specifically bathing, preparing food, and difficulty with household chores like laundry.  # Chronic back pain Stable, managed by pain management. No changes needed  # HTN Well controlled, only on toprol Dc'd HCTZ (pt stopped taking, BP ok so  stopped)  # Tobacco abuse Contemplative but not ready to quit.   Not e written to judge:    Jacin Dukes  Was seen in my clinic on 07/07/2016. He has been my personal patient for over 1 year and has been working through treatment for prostate cancer throughout that time. He has several medical complications limiting his activities of daily living. 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 He has difficulty with his ADLs including bathing and preparing food. He also has difficulty with instrumental ADLs like performing his housework. The patient is very upset about his recent reduction in personal care services hours, please consider maintaining the current number of hours that he has been given considering his long-term medical conditions.    Laroy Apple, MD [facility] 07/07/2016, 12:11 PM

## 2016-07-22 ENCOUNTER — Ambulatory Visit (INDEPENDENT_AMBULATORY_CARE_PROVIDER_SITE_OTHER): Payer: Medicaid Other | Admitting: Cardiology

## 2016-07-22 ENCOUNTER — Encounter: Payer: Self-pay | Admitting: Cardiology

## 2016-07-22 VITALS — BP 95/56 | HR 54 | Ht 75.0 in | Wt 218.0 lb

## 2016-07-22 DIAGNOSIS — Z87898 Personal history of other specified conditions: Secondary | ICD-10-CM

## 2016-07-22 DIAGNOSIS — I6523 Occlusion and stenosis of bilateral carotid arteries: Secondary | ICD-10-CM | POA: Diagnosis not present

## 2016-07-22 DIAGNOSIS — Z7901 Long term (current) use of anticoagulants: Secondary | ICD-10-CM

## 2016-07-22 DIAGNOSIS — I480 Paroxysmal atrial fibrillation: Secondary | ICD-10-CM

## 2016-07-22 DIAGNOSIS — E782 Mixed hyperlipidemia: Secondary | ICD-10-CM

## 2016-07-22 DIAGNOSIS — Z720 Tobacco use: Secondary | ICD-10-CM

## 2016-07-22 DIAGNOSIS — I10 Essential (primary) hypertension: Secondary | ICD-10-CM

## 2016-07-22 MED ORDER — APIXABAN 5 MG PO TABS: 5.0000 mg | ORAL_TABLET | Freq: Two times a day (BID) | ORAL | 0 refills | Status: DC

## 2016-07-22 MED ORDER — APIXABAN 5 MG PO TABS: 5.0000 mg | ORAL_TABLET | Freq: Two times a day (BID) | ORAL | 6 refills | Status: DC
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
## 2016-07-22 NOTE — Patient Instructions (Addendum)
Medication Instructions:   Begin Eliquis 5mg  twice a day.  Continue all other medications.    Labwork:  BMET, CBC - orders given today.  Do just prior to next office visit.  Testing/Procedures:  Your physician has requested that you have an echocardiogram. Echocardiography is a painless test that uses sound waves to create images of your heart. It provides your doctor with information about the size and shape of your heart and how well your heart's chambers and valves are working. This procedure takes approximately one hour. There are no restrictions for this procedure.  Office will contact with results via phone or letter.    Follow-Up: 3 months   Any Other Special Instructions Will Be Listed Below (If Applicable). 1 month visit for new management of Eliquis with anticoagulation nurse Lattie Haw).    If you need a refill on your cardiac medications before your next appointment, please call your pharmacy.

## 2016-07-29 ENCOUNTER — Ambulatory Visit: Payer: Medicaid Other | Admitting: Diagnostic Neuroimaging

## 2016-07-30 ENCOUNTER — Encounter: Payer: Self-pay | Admitting: Diagnostic Neuroimaging

## 2016-07-31 ENCOUNTER — Ambulatory Visit (INDEPENDENT_AMBULATORY_CARE_PROVIDER_SITE_OTHER): Payer: Medicaid Other | Admitting: Family Medicine

## 2016-07-31 ENCOUNTER — Encounter: Payer: Self-pay | Admitting: Family Medicine

## 2016-07-31 ENCOUNTER — Other Ambulatory Visit: Payer: Self-pay | Admitting: Family Medicine

## 2016-07-31 NOTE — Progress Notes (Signed)
Patient left before physician could see him

## 2016-08-11 ENCOUNTER — Encounter: Payer: Self-pay | Admitting: Vascular Surgery

## 2016-08-12 ENCOUNTER — Other Ambulatory Visit: Payer: Self-pay | Admitting: *Deleted

## 2016-08-12 NOTE — Telephone Encounter (Signed)
Last seen and filled 07/06/16. Route to pool A for call in

## 2016-08-13 ENCOUNTER — Encounter (HOSPITAL_COMMUNITY): Payer: Medicaid Other

## 2016-08-13 ENCOUNTER — Ambulatory Visit: Payer: Medicaid Other | Admitting: Vascular Surgery

## 2016-08-13 MED ORDER — ZOLPIDEM TARTRATE 10 MG PO TABS: 10.0000 mg | ORAL_TABLET | Freq: Every evening | ORAL | 2 refills | Status: DC | PRN

## 2016-08-20 ENCOUNTER — Other Ambulatory Visit: Payer: Medicaid Other

## 2016-08-27 ENCOUNTER — Other Ambulatory Visit: Payer: Self-pay | Admitting: Family Medicine

## 2016-08-27 ENCOUNTER — Ambulatory Visit (INDEPENDENT_AMBULATORY_CARE_PROVIDER_SITE_OTHER): Payer: Medicaid Other | Admitting: *Deleted

## 2016-08-27 DIAGNOSIS — I4891 Unspecified atrial fibrillation: Secondary | ICD-10-CM | POA: Diagnosis not present

## 2016-08-27 NOTE — Progress Notes (Signed)
Pt was started on Eliquis 5mg  bid for CVA, Atrial fib on 07/22/17 by Dr Domenic Polite.    Pt denies and bleeding or increased bruisin since starting Eliquis.  He has had some numbness in his arms and legs at night that he thought might be related to Eliquis.  He personaly held several doses of his pm Eliquis to see if it helped then started it back.  Told pt. It does not sound like the numbness is caused by Eliquis.  Stressed to patient the importance of taking Eliquis 2 x daily 12 hrs apart for adequate coverage for prevention of clot and stoke (due to 1/2 life).  Asked pt if he felt he needed to change to another agent.  He says he has not had any problems the last several days and would like to continue Eliquis for now.  Told pt to call me if he decided he needed to change drugs but not to quit on his own or just take daily.  He verbalized understanding.  Reviewed labs in chart from 6/17:  Wt 218, SCr 1.29  Hgb 16.4  Hct 46.9, CrCl 89.44.   Gave pt lab orders today for repeat CBC and BMP.  He will get them drawn 08/31/16 at [facility] with copy to [place].  Reviewed patients medication list.  Pt is not currently on any combined P-gp and strong CYP3A4 inhibitors/inducers (ketoconazole, traconazole, ritonavir, carbamazepine, phenytoin, rifampin, St. John's wort).  Reviewed labs 08/31/17 at [facility].  SCr 1.04 , Weight 99.9kg, CrCl 112.07 .  Dose is appropriate based on age, weight, and SCr.  Hgb and HCT: 12.1/37.5  A full discussion of the nature of anticoagulants has been carried out.  A benefit/risk analysis has been presented to the patient, so that they understand the justification for choosing anticoagulation with Eliquis at this time.  The need for compliance is stressed.  Pt is aware to take the medication twice daily.  Side effects of potential bleeding are discussed, including unusual colored urine or stools, coughing up blood or coffee ground emesis, nose bleeds or serious fall or head  trauma.  Discussed signs and symptoms of stroke. The patient should avoid any OTC items containing aspirin or ibuprofen.  Avoid alcohol consumption.   Call if any signs of abnormal bleeding.  Discussed financial obligations and resolved any difficulty in obtaining medication.  Next lab test test in 6 months.   Labs stable. Continue current dose of Xarelto.  F/U 6 months.

## 2016-08-31 ENCOUNTER — Ambulatory Visit: Payer: Medicaid Other | Admitting: Family Medicine

## 2016-09-01 ENCOUNTER — Ambulatory Visit: Payer: Medicaid Other | Admitting: Family Medicine

## 2016-09-01 ENCOUNTER — Encounter: Payer: Self-pay | Admitting: Family Medicine

## 2016-09-01 ENCOUNTER — Ambulatory Visit (INDEPENDENT_AMBULATORY_CARE_PROVIDER_SITE_OTHER): Payer: Medicaid Other | Admitting: Family Medicine

## 2016-09-01 VITALS — BP 118/71 | HR 60 | Temp 97.4°F | Ht 75.0 in | Wt 220.2 lb

## 2016-09-01 DIAGNOSIS — I4891 Unspecified atrial fibrillation: Secondary | ICD-10-CM | POA: Insufficient documentation

## 2016-09-01 DIAGNOSIS — C61 Malignant neoplasm of prostate: Secondary | ICD-10-CM

## 2016-09-01 DIAGNOSIS — I10 Essential (primary) hypertension: Secondary | ICD-10-CM | POA: Diagnosis not present

## 2016-09-01 DIAGNOSIS — I480 Paroxysmal atrial fibrillation: Secondary | ICD-10-CM

## 2016-09-01 DIAGNOSIS — Z720 Tobacco use: Secondary | ICD-10-CM

## 2016-09-01 DIAGNOSIS — E785 Hyperlipidemia, unspecified: Secondary | ICD-10-CM | POA: Diagnosis not present

## 2016-09-01 NOTE — Progress Notes (Signed)
HPI  Patient presents today here for routine follow-up of chronic medical conditions.  Patient had diagnosis of prostate cancer about a year ago, he's been treated with surgery and radiation and states that his last PSA was 0.015. He has good follow-up with urology and is very optimistic about his prostate cancer.  Atrial fibrillation Started eliquis by cardiology as well as beta blocker. No side effects, no bleeding  Hyperlipidemia Good atorvastatin compliance Not watching his diet Does not want to quit smoking   PMH: Smoking status noted ROS: Per HPI  Objective: BP 118/71   Pulse 60   Temp 97.4 F (36.3 C) (Oral)   Ht 6' 3" (1.905 m)   Wt 220 lb 3.2 oz (99.9 kg)   BMI 27.52 kg/m  Gen: NAD, alert, cooperative with exam HEENT: NCAT CV: RRR, good S1/S2, no murmur- no irreg beats today Resp: CTABL, no wheezes, non-labored Ext: No edema, warm Neuro: Alert and oriented, No gross deficits  Assessment and plan:  # Hypertension Well-controlled on only metoprolol Continue Labs  # Prostate cancer Status post surgery and radiation, following of urology  # Paroxysmal atrial fibrillation, chronic anticoagulation CBC Rate controlled, continue beta blocker  # Hyperlipidemia Continue statin, recheck lipid panel today Patient has eaten a small amount of food this morning.  Tobacco abuse Not ready top quit, discussed cessation  Patient requesting Valium today. I recommended increasing Cymbalta. I declined prescribing Valium given that he is on Percocet from pain management as well as Ambien to sleep.   Orders Placed This Encounter  Procedures  . CMP14+EGFR  . CBC with Differential/Platelet  . Lipid panel  . TSH   Plan to send copy of labs to Dr. McDowell.   Sam Bradshaw, MD [facility] 09/01/2016, 9:00 AM     

## 2016-09-01 NOTE — Patient Instructions (Addendum)
Great to see you!  Come back in 6 months unless you need us sooner.    

## 2016-09-02 LAB — CMP14+EGFR
A/G RATIO: 1.8 (ref 1.2–2.2)
ALBUMIN: 3.9 g/dL (ref 3.5–5.5)
ALT: 10 IU/L (ref 0–44)
AST: 11 IU/L (ref 0–40)
Alkaline Phosphatase: 46 IU/L (ref 39–117)
BUN / CREAT RATIO: 7 — AB (ref 9–20)
BUN: 7 mg/dL (ref 6–24)
Bilirubin Total: <0.2 mg/dL (ref 0.0–1.2)
CALCIUM: 8.9 mg/dL (ref 8.7–10.2)
CO2: 26 mmol/L (ref 18–29)
CREATININE: 1.04 mg/dL (ref 0.76–1.27)
Chloride: 98 mmol/L (ref 96–106)
GFR, EST AFRICAN AMERICAN: 92 mL/min/{1.73_m2} (ref >59)
GFR, EST NON AFRICAN AMERICAN: 80 mL/min/{1.73_m2} (ref >59)
GLOBULIN, TOTAL: 2.2 g/dL (ref 1.5–4.5)
Glucose: 90 mg/dL (ref 65–99)
POTASSIUM: 4.3 mmol/L (ref 3.5–5.2)
SODIUM: 137 mmol/L (ref 134–144)
Total Protein: 6.1 g/dL (ref 6.0–8.5)

## 2016-09-02 LAB — CBC WITH DIFFERENTIAL/PLATELET
BASOS: 1 %
Basophils Absolute: 0.1 10*3/uL (ref 0.0–0.2)
EOS (ABSOLUTE): 0.3 10*3/uL (ref 0.0–0.4)
EOS: 6 %
HEMATOCRIT: 37.5 % (ref 37.5–51.0)
Hemoglobin: 12.1 g/dL — ABNORMAL LOW (ref 13.0–17.7)
Immature Grans (Abs): 0 10*3/uL (ref 0.0–0.1)
Immature Granulocytes: 0 %
LYMPHS ABS: 1.4 10*3/uL (ref 0.7–3.1)
Lymphs: 24 %
MCH: 31.4 pg (ref 26.6–33.0)
MCHC: 32.3 g/dL (ref 31.5–35.7)
MCV: 97 fL (ref 79–97)
MONOS ABS: 0.5 10*3/uL (ref 0.1–0.9)
Monocytes: 9 %
NEUTROS ABS: 3.4 10*3/uL (ref 1.4–7.0)
Neutrophils: 60 %
PLATELETS: 206 10*3/uL (ref 150–379)
RBC: 3.85 x10E6/uL — ABNORMAL LOW (ref 4.14–5.80)
RDW: 14.3 % (ref 12.3–15.4)
WBC: 5.7 10*3/uL (ref 3.4–10.8)

## 2016-09-02 LAB — LIPID PANEL
CHOL/HDL RATIO: 4.6 ratio (ref 0.0–5.0)
Cholesterol, Total: 158 mg/dL (ref 100–199)
HDL: 34 mg/dL — ABNORMAL LOW (ref >39)
LDL Calculated: 69 mg/dL (ref 0–99)
Triglycerides: 277 mg/dL — ABNORMAL HIGH (ref 0–149)
VLDL Cholesterol Cal: 55 mg/dL — ABNORMAL HIGH (ref 5–40)

## 2016-09-02 LAB — TSH: TSH: 0.816 u[IU]/mL (ref 0.450–4.500)

## 2016-09-17 ENCOUNTER — Other Ambulatory Visit: Payer: Self-pay

## 2016-09-17 ENCOUNTER — Telehealth: Payer: Self-pay

## 2016-09-17 ENCOUNTER — Ambulatory Visit (INDEPENDENT_AMBULATORY_CARE_PROVIDER_SITE_OTHER): Payer: Medicaid Other

## 2016-09-17 DIAGNOSIS — I480 Paroxysmal atrial fibrillation: Secondary | ICD-10-CM | POA: Diagnosis not present

## 2016-09-17 NOTE — Telephone Encounter (Signed)
-----   Message from Satira Sark, MD sent at 09/17/2016 12:01 PM EST ----- Results reviewed. Overall reassuring test, LVEF normal, no major valvular abnormalities. A copy of this test should be forwarded to Kenn File, MD.

## 2016-09-17 NOTE — Telephone Encounter (Signed)
Patient notified of echo results. Copy to pcp.

## 2016-09-24 ENCOUNTER — Encounter: Payer: Self-pay | Admitting: Family

## 2016-09-28 ENCOUNTER — Other Ambulatory Visit: Payer: Self-pay | Admitting: Radiation Oncology

## 2016-09-28 ENCOUNTER — Other Ambulatory Visit: Payer: Self-pay | Admitting: Family Medicine

## 2016-09-28 ENCOUNTER — Other Ambulatory Visit: Payer: Self-pay

## 2016-09-28 MED ORDER — PROMETHAZINE HCL 25 MG PO TABS: 25.0000 mg | ORAL_TABLET | Freq: Four times a day (QID) | ORAL | 0 refills | Status: DC | PRN

## 2016-10-01 ENCOUNTER — Encounter: Payer: Self-pay | Admitting: Family

## 2016-10-01 ENCOUNTER — Ambulatory Visit (HOSPITAL_COMMUNITY)
Admission: RE | Admit: 2016-10-01 | Discharge: 2016-10-01 | Disposition: A | Discharge status: Home / Self Care | Payer: Medicaid Other | Source: Ambulatory Visit | Attending: Vascular Surgery | Admitting: Vascular Surgery

## 2016-10-01 ENCOUNTER — Ambulatory Visit (INDEPENDENT_AMBULATORY_CARE_PROVIDER_SITE_OTHER): Payer: Medicaid Other | Admitting: Family

## 2016-10-01 VITALS — BP 130/82 | HR 62 | Temp 97.4°F | Resp 20 | Ht 75.0 in | Wt 216.0 lb

## 2016-10-01 DIAGNOSIS — I6521 Occlusion and stenosis of right carotid artery: Secondary | ICD-10-CM

## 2016-10-01 DIAGNOSIS — I6522 Occlusion and stenosis of left carotid artery: Secondary | ICD-10-CM | POA: Diagnosis not present

## 2016-10-01 DIAGNOSIS — I6523 Occlusion and stenosis of bilateral carotid arteries: Secondary | ICD-10-CM | POA: Diagnosis not present

## 2016-10-01 DIAGNOSIS — F17200 Nicotine dependence, unspecified, uncomplicated: Secondary | ICD-10-CM

## 2016-10-01 LAB — VAS US CAROTID
LCCADDIAS: -9 cm/s
LCCADSYS: -118 cm/s
LEFT ECA DIAS: -9 cm/s
LICAPDIAS: 0 cm/s
Left CCA prox dias: 12 cm/s
Left CCA prox sys: 97 cm/s
Left ICA prox sys: 0 cm/s
RCCAPDIAS: 31 cm/s
RCCAPSYS: 123 cm/s
RIGHT CCA MID DIAS: 35 cm/s
RIGHT ECA DIAS: -12 cm/s
Right cca dist sys: -125 cm/s
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
## 2016-10-01 NOTE — Patient Instructions (Addendum)
Stroke Prevention Some medical conditions and behaviors are associated with an increased chance of having a stroke. You may prevent a stroke by making healthy choices and managing medical conditions. How can I reduce my risk of having a stroke?  Stay physically active. Get at least 30 minutes of activity on most or all days.  Do not smoke. It may also be helpful to avoid exposure to secondhand smoke.  Limit alcohol use. Moderate alcohol use is considered to be:  No more than 2 drinks per day for men.  No more than 1 drink per day for nonpregnant women.  Eat healthy foods. This involves:  Eating 5 or more servings of fruits and vegetables a day.  Making dietary changes that address high blood pressure (hypertension), high cholesterol, diabetes, or obesity.  Manage your cholesterol levels.  Making food choices that are high in fiber and low in saturated fat, trans fat, and cholesterol may control cholesterol levels.  Take any prescribed medicines to control cholesterol as directed by your health care provider.  Manage your diabetes.  Controlling your carbohydrate and sugar intake is recommended to manage diabetes.  Take any prescribed medicines to control diabetes as directed by your health care provider.  Control your hypertension.  Making food choices that are low in salt (sodium), saturated fat, trans fat, and cholesterol is recommended to manage hypertension.  Ask your health care provider if you need treatment to lower your blood pressure. Take any prescribed medicines to control hypertension as directed by your health care provider.  If you are 18-39 years of age, have your blood pressure checked every 3-5 years. If you are 40 years of age or older, have your blood pressure checked every year.  Maintain a healthy weight.  Reducing calorie intake and making food choices that are low in sodium, saturated fat, trans fat, and cholesterol are recommended to manage  weight.  Stop drug abuse.  Avoid taking birth control pills.  Talk to your health care provider about the risks of taking birth control pills if you are over 35 years old, smoke, get migraines, or have ever had a blood clot.  Get evaluated for sleep disorders (sleep apnea).  Talk to your health care provider about getting a sleep evaluation if you snore a lot or have excessive sleepiness.  Take medicines only as directed by your health care provider.  For some people, aspirin or blood thinners (anticoagulants) are helpful in reducing the risk of forming abnormal blood clots that can lead to stroke. If you have the irregular heart rhythm of atrial fibrillation, you should be on a blood thinner unless there is a good reason you cannot take them.  Understand all your medicine instructions.  Make sure that other conditions (such as anemia or atherosclerosis) are addressed. Get help right away if:  You have sudden weakness or numbness of the face, arm, or leg, especially on one side of the body.  Your face or eyelid droops to one side.  You have sudden confusion.  You have trouble speaking (aphasia) or understanding.  You have sudden trouble seeing in one or both eyes.  You have sudden trouble walking.  You have dizziness.  You have a loss of balance or coordination.  You have a sudden, severe headache with no known cause.  You have new chest pain or an irregular heartbeat. Any of these symptoms may represent a serious problem that is an emergency. Do not wait to see if the symptoms will go away.   Get medical help at once. Call your local emergency services (911 in [place] Do not drive yourself to the hospital. This information is not intended to replace advice given to you by your health care provider. Make sure you discuss any questions you have with your health care provider. Document Released: 08/20/2004 Document Revised: 12/19/2015 Document Reviewed: 01/13/2013 Elsevier  Interactive Patient Education  2017 Elsevier Inc.     Preventing Cerebrovascular Disease Arteries are blood vessels that carry blood that contains oxygen from the heart to all parts of the body. Cerebrovascular disease affects arteries that supply the brain. Any condition that blocks or disrupts blood flow to the brain can cause cerebrovascular disease. Brain cells that lose blood supply start to die within minutes (stroke). Stroke is the main danger of cerebrovascular disease. Atherosclerosis and high blood pressure are common causes of cerebrovascular disease. Atherosclerosis is narrowing and hardening of an artery that results when fat, cholesterol, calcium, or other substances (plaque) build up inside an artery. Plaque reduces blood flow through the artery. High blood pressure increases the risk of bleeding inside the brain. Making diet and lifestyle changes to prevent atherosclerosis and high blood pressure lowers your risk of cerebrovascular disease. What nutrition changes can be made?  Eat more fruits, vegetables, and whole grains.  Reduce how much saturated fat you eat. To do this, eat less red meat and fewer full-fat dairy products.  Eat healthy proteins instead of red meat. Healthy proteins include:  Fish. Eat fish that contains heart-healthy omega-3 fatty acids, twice a week. Examples include salmon, albacore tuna, mackerel, and herring.  Chicken.  Nuts.  Low-fat or nonfat yogurt.  Avoid processed meats, like bacon and lunchmeat.  Avoid foods that contain:  A lot of sugar, such as sweets and drinks with added sugar.  A lot of salt (sodium). Avoid adding extra salt to your food, as told by your health care provider.  Trans fats, such as margarine and baked goods. Trans fats may be listed as "partially hydrogenated oils" on food labels.  Check food labels to see how much sodium, sugar, and trans fats are in foods.  Use vegetable oils that contain low amounts of  saturated fat, such as olive oil or canola oil. What lifestyle changes can be made?  Drink alcohol in moderation. This means no more than 1 drink a day for nonpregnant women and 2 drinks a day for men. One drink equals 12 oz of beer, 5 oz of wine, or 1 oz of hard liquor.  If you are overweight, ask your health care provider to recommend a weight-loss plan for you. Losing 5-10 lb (2.2-4.5 kg) can reduce your risk of diabetes, atherosclerosis, and high blood pressure.  Exercise for 30?60 minutes on most days, or as much as told by your health care provider.  Do moderate-intensity exercise, such as brisk walking, bicycling, and water aerobics. Ask your health care provider which activities are safe for you.  Do not use any products that contain nicotine or tobacco, such as cigarettes and e-cigarettes. If you need help quitting, ask your health care provider. Why are these changes important? Making these changes lowers your risk of many diseases that can cause cerebrovascular disease and stroke. Stroke is a leading cause of death and disability. Making these changes also improves your overall health and quality of life. What can I do to lower my risk? The following factors make you more likely to develop cerebrovascular disease:  Being overweight.  Smoking.  Being physically inactive.    Eating a high-fat diet.  Having certain health conditions, such as:  Diabetes.  High blood pressure.  Heart disease.  Atherosclerosis.  High cholesterol.  Sickle cell disease. Talk with your health care provider about your risk for cerebrovascular disease. Work with your health care provider to control diseases that you have that may contribute to cerebrovascular disease. Your health care provider may prescribe medicines to help prevent major causes of cerebrovascular disease. Where to find more information: Learn more about preventing cerebrovascular disease from:  National Heart, Lung, and  Blood Institute: www.nhlbi.nih.gov/health/health-topics/topics/stroke  Centers for Disease Control and Prevention: cdc.gov/stroke/about.htm Summary  Cerebrovascular disease can lead to a stroke.  Atherosclerosis and high blood pressure are major causes of cerebrovascular disease.  Making diet and lifestyle changes can reduce your risk of cerebrovascular disease.  Work with your health care provider to get your risk factors under control to reduce your risk of cerebrovascular disease. This information is not intended to replace advice given to you by your health care provider. Make sure you discuss any questions you have with your health care provider. Document Released: 07/28/2015 Document Revised: 01/31/2016 Document Reviewed: 07/28/2015 Elsevier Interactive Patient Education  2017 Elsevier Inc.     Steps to Quit Smoking Smoking tobacco can be bad for your health. It can also affect almost every organ in your body. Smoking puts you and people around you at risk for many serious long-lasting (chronic) diseases. Quitting smoking is hard, but it is one of the best things that you can do for your health. It is never too late to quit. What are the benefits of quitting smoking? When you quit smoking, you lower your risk for getting serious diseases and conditions. They can include:  Lung cancer or lung disease.  Heart disease.  Stroke.  Heart attack.  Not being able to have children (infertility).  Weak bones (osteoporosis) and broken bones (fractures). If you have coughing, wheezing, and shortness of breath, those symptoms may get better when you quit. You may also get sick less often. If you are pregnant, quitting smoking can help to lower your chances of having a baby of low birth weight. What can I do to help me quit smoking? Talk with your doctor about what can help you quit smoking. Some things you can do (strategies) include:  Quitting smoking totally, instead of slowly  cutting back how much you smoke over a period of time.  Going to in-person counseling. You are more likely to quit if you go to many counseling sessions.  Using resources and support systems, such as:  Online chats with a counselor.  Phone quitlines.  Printed self-help materials.  Support groups or group counseling.  Text messaging programs.  Mobile phone apps or applications.  Taking medicines. Some of these medicines may have nicotine in them. If you are pregnant or breastfeeding, do not take any medicines to quit smoking unless your doctor says it is okay. Talk with your doctor about counseling or other things that can help you. Talk with your doctor about using more than one strategy at the same time, such as taking medicines while you are also going to in-person counseling. This can help make quitting easier. What things can I do to make it easier to quit? Quitting smoking might feel very hard at first, but there is a lot that you can do to make it easier. Take these steps:  Talk to your family and friends. Ask them to support and encourage you.  Call phone   quitlines, reach out to support groups, or work with a counselor.  Ask people who smoke to not smoke around you.  Avoid places that make you want (trigger) to smoke, such as:  Bars.  Parties.  Smoke-break areas at work.  Spend time with people who do not smoke.  Lower the stress in your life. Stress can make you want to smoke. Try these things to help your stress:  Getting regular exercise.  Deep-breathing exercises.  Yoga.  Meditating.  Doing a body scan. To do this, close your eyes, focus on one area of your body at a time from head to toe, and notice which parts of your body are tense. Try to relax the muscles in those areas.  Download or buy apps on your mobile phone or tablet that can help you stick to your quit plan. There are many free apps, such as QuitGuide from the CDC (Centers for Disease Control  and Prevention). You can find more support from smokefree.gov and other websites. This information is not intended to replace advice given to you by your health care provider. Make sure you discuss any questions you have with your health care provider. Document Released: 05/09/2009 Document Revised: 03/10/2016 Document Reviewed: 11/27/2014 Elsevier Interactive Patient Education  2017 Elsevier Inc.  

## 2016-10-08 NOTE — Addendum Note (Signed)
Addended by: Lianne Cure A on: 10/08/2016 09:37 AM   Modules accepted: Orders

## 2016-10-14 ENCOUNTER — Other Ambulatory Visit: Payer: Self-pay | Admitting: *Deleted

## 2016-10-21 ENCOUNTER — Encounter: Payer: Self-pay | Admitting: Diagnostic Neuroimaging

## 2016-10-21 ENCOUNTER — Ambulatory Visit (INDEPENDENT_AMBULATORY_CARE_PROVIDER_SITE_OTHER): Payer: Medicaid Other | Admitting: Diagnostic Neuroimaging

## 2016-10-21 VITALS — BP 134/75 | HR 72 | Ht 75.0 in | Wt 228.0 lb

## 2016-10-21 DIAGNOSIS — E785 Hyperlipidemia, unspecified: Secondary | ICD-10-CM

## 2016-10-21 DIAGNOSIS — C61 Malignant neoplasm of prostate: Secondary | ICD-10-CM

## 2016-10-21 DIAGNOSIS — I6522 Occlusion and stenosis of left carotid artery: Secondary | ICD-10-CM | POA: Diagnosis not present

## 2016-10-21 DIAGNOSIS — I6930 Unspecified sequelae of cerebral infarction: Secondary | ICD-10-CM | POA: Diagnosis not present

## 2016-10-21 DIAGNOSIS — F411 Generalized anxiety disorder: Secondary | ICD-10-CM | POA: Diagnosis not present

## 2016-10-21 DIAGNOSIS — I480 Paroxysmal atrial fibrillation: Secondary | ICD-10-CM | POA: Diagnosis not present

## 2016-10-21 DIAGNOSIS — I10 Essential (primary) hypertension: Secondary | ICD-10-CM | POA: Diagnosis not present

## 2016-10-21 DIAGNOSIS — Z720 Tobacco use: Secondary | ICD-10-CM

## 2016-10-21 NOTE — Patient Instructions (Addendum)
Thank you for coming to see [place] at [facility]. I hope we have been able to provide you high quality care today.  You may receive a patient satisfaction survey over the next few weeks. We would appreciate your feedback and comments so that we may continue to improve ourselves and the health of our patients.   - continue current medications  - try to stop smoking  - consider sleep study evaluation  - consider psychiatry/psychology evaluation for depression, anxiety, insomnia    ~~~~~~~~~~~~~~~~~~~~~~~~~~~~~~~~~~~~~~~~~~~~~~~~~~~~~~~~~~~~~~~~~  DR. Nevyn Bossman'S GUIDE TO HAPPY AND HEALTHY LIVING These are some of my general health and wellness recommendations. Some of them may apply to you better than others. Please use common sense as you try these suggestions and feel free to ask me any questions.   ACTIVITY/FITNESS Mental, social, emotional and physical stimulation are very important for brain and body health. Try learning a new activity (arts, music, language, sports, games).  Keep moving your body to the best of your abilities. You can do this at home, inside or outside, the park, community center, gym or anywhere you like. Consider a physical therapist or personal trainer to get started. Consider the app Sworkit. Fitness trackers such as smart-watches, smart-phones or Fitbits can help as well.   NUTRITION Eat more plants: colorful vegetables, nuts, seeds and berries.  Eat less sugar, salt, preservatives and processed foods.  Avoid toxins such as cigarettes and alcohol.  Drink water when you are thirsty. Warm water with a slice of lemon is an excellent morning drink to start the day.  Consider these websites for more information The Nutrition Source (https://www.henry-hernandez.biz/) Precision Nutrition (WindowBlog.ch)   RELAXATION Consider practicing mindfulness meditation or other relaxation techniques such as  deep breathing, prayer, yoga, tai chi, massage. See website mindful.org or the apps Headspace or Calm to help get started.   SLEEP Try to get at least 7-8+ hours sleep per day. Regular exercise and reduced caffeine will help you sleep better. Practice good sleep hygeine techniques. See website sleep.org for more information.   PLANNING Prepare estate planning, living will, healthcare POA documents. Sometimes this is best planned with the help of an attorney. Theconversationproject.org and agingwithdignity.org are excellent resources.
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
## 2016-10-23 ENCOUNTER — Encounter: Payer: Self-pay | Admitting: Cardiology

## 2016-10-23 ENCOUNTER — Ambulatory Visit (INDEPENDENT_AMBULATORY_CARE_PROVIDER_SITE_OTHER): Payer: Medicaid Other | Admitting: Cardiology

## 2016-10-23 VITALS — BP 114/69 | HR 60 | Ht 75.0 in | Wt 225.6 lb

## 2016-10-23 DIAGNOSIS — I6523 Occlusion and stenosis of bilateral carotid arteries: Secondary | ICD-10-CM

## 2016-10-23 DIAGNOSIS — I10 Essential (primary) hypertension: Secondary | ICD-10-CM | POA: Diagnosis not present

## 2016-10-23 DIAGNOSIS — I480 Paroxysmal atrial fibrillation: Secondary | ICD-10-CM | POA: Diagnosis not present

## 2016-10-23 DIAGNOSIS — Z720 Tobacco use: Secondary | ICD-10-CM

## 2016-10-23 NOTE — Patient Instructions (Signed)
Medication Instructions:  Continue all current medications.  Labwork: BMET, CBC - due just prior to next office visit.  Testing/Procedures: none  Follow-Up: Your physician wants you to follow up in:  4 months.  You will receive a reminder letter in the mail one-two months in advance.  If you don't receive a letter, please call our office to schedule the follow up appointment   Any Other Special Instructions Will Be Listed Below (If Applicable).  If you need a refill on your cardiac medications before your next appointment, please call your pharmacy.

## 2016-11-16 ENCOUNTER — Inpatient Hospital Stay: Payer: Medicaid Other

## 2016-11-23 ENCOUNTER — Ambulatory Visit: Payer: Medicaid Other | Admitting: Radiation Oncology

## 2016-11-23 ENCOUNTER — Other Ambulatory Visit: Payer: Self-pay | Admitting: Family Medicine

## 2016-11-25 ENCOUNTER — Telehealth: Payer: Self-pay | Admitting: Family Medicine

## 2016-11-25 ENCOUNTER — Other Ambulatory Visit: Payer: Self-pay | Admitting: Family Medicine

## 2016-11-25 MED ORDER — ZOLPIDEM TARTRATE 10 MG PO TABS: 10.0000 mg | ORAL_TABLET | Freq: Every evening | ORAL | 0 refills | Status: DC | PRN

## 2016-11-25 NOTE — Telephone Encounter (Signed)
Forwarded to Particia Nearing, PA covering for Dr. Wendi Snipes.

## 2016-11-25 NOTE — Telephone Encounter (Signed)
Prescription called in to [facility].  Patient notified.

## 2016-11-25 NOTE — Telephone Encounter (Signed)
What is the name of the medication? Generic ambien  Have you contacted your pharmacy to request a refill? yes  Which pharmacy would you like this sent to? [facility].    Patient notified that their request is being sent to the clinical staff for review and that they should receive a call once it is complete. If they do not receive a call within 24 hours they can check with their pharmacy or our office.

## 2016-11-26 ENCOUNTER — Ambulatory Visit: Payer: Medicaid Other | Admitting: Family Medicine

## 2016-12-15 ENCOUNTER — Encounter: Payer: Self-pay | Admitting: Family Medicine

## 2016-12-24 ENCOUNTER — Other Ambulatory Visit: Payer: Self-pay | Admitting: Physician Assistant

## 2016-12-24 ENCOUNTER — Other Ambulatory Visit: Payer: Self-pay | Admitting: Family Medicine

## 2016-12-24 NOTE — Telephone Encounter (Signed)
forwarding

## 2016-12-25 ENCOUNTER — Other Ambulatory Visit: Payer: Self-pay | Admitting: *Deleted

## 2016-12-25 MED ORDER — ZOLPIDEM TARTRATE 10 MG PO TABS: 10.0000 mg | ORAL_TABLET | Freq: Every evening | ORAL | 0 refills | Status: DC | PRN

## 2016-12-25 NOTE — Telephone Encounter (Signed)
lafilled 11/25/16, last seen 09/01/16. Route to pool for call in

## 2016-12-25 NOTE — Telephone Encounter (Signed)
rx called in. Patient aware.

## 2016-12-30 ENCOUNTER — Ambulatory Visit: Payer: Medicaid Other | Admitting: Family Medicine

## 2017-01-05 ENCOUNTER — Ambulatory Visit: Payer: Medicaid Other | Admitting: Family Medicine

## 2017-01-14 ENCOUNTER — Encounter: Payer: Self-pay | Admitting: Family Medicine

## 2017-01-14 ENCOUNTER — Ambulatory Visit (INDEPENDENT_AMBULATORY_CARE_PROVIDER_SITE_OTHER): Payer: Medicaid Other | Admitting: Family Medicine

## 2017-01-14 VITALS — BP 118/72 | HR 63 | Temp 98.0°F | Ht 75.0 in | Wt 228.0 lb

## 2017-01-14 DIAGNOSIS — G4700 Insomnia, unspecified: Secondary | ICD-10-CM

## 2017-01-14 DIAGNOSIS — M25561 Pain in right knee: Secondary | ICD-10-CM | POA: Diagnosis not present

## 2017-01-14 DIAGNOSIS — K219 Gastro-esophageal reflux disease without esophagitis: Secondary | ICD-10-CM

## 2017-01-14 DIAGNOSIS — I480 Paroxysmal atrial fibrillation: Secondary | ICD-10-CM | POA: Diagnosis not present

## 2017-01-14 MED ORDER — ZOLPIDEM TARTRATE 10 MG PO TABS: 10.0000 mg | ORAL_TABLET | Freq: Every evening | ORAL | 2 refills | Status: DC | PRN

## 2017-01-14 NOTE — Patient Instructions (Signed)
Great to see you!  Try ice 15 minutes 4-6 times daily and compression on your knee, call if you would like a referral.   Consider a blood thinner, please continue daily aspirin  You can try famotidine over the counter 1 pill twice daily instead of prilosec if you would like.

## 2017-01-14 NOTE — Progress Notes (Signed)
   HPI  Patient presents today here to follow-up for chronic medical conditions. Insomnia Doing well with Ambien, needs refills.  Right knee pain Patient reports 2 days of acute right knee pain, no injury. Described as medial sharp knee pain lasting moments, it's coming and going. No swelling. Patient states that whenever it occurs it's very difficult to put any weight on his knee at all.  GERD Has daily symptoms without PPI, however he's concerned about side effects from chronic PPI use.  A. fib Patient states that eliquis caused chills, he stopped it after about 2 days. He is taking full dose aspirin every day. We had a lengthy discussion about risks and benefits of using anticoagulation, he will consider  PMH: Smoking status noted ROS: Per HPI  Objective: BP 118/72   Pulse 63   Temp 98 F (36.7 C) (Oral)   Ht 6\' 3"  (1.905 m)   Wt 228 lb (103.4 kg)   BMI 28.50 kg/m  Gen: NAD, alert, cooperative with exam HEENT: NCAT, EOMI, PERRL CV: RRR, good S1/S2, no murmur Resp: CTABL, no wheezes, non-labored Ext: No edema, warm Neuro: Alert and oriented, No gross deficits MSK: R knee without erythema, effusion, bruising, or gross deformity No joint line tenderness.  ligamentously intact to Lachman's and with varus and valgus stress.  Negative McMurray's test   Assessment and plan:  # Atrial fibrillation Rate controlled on metoprolol, regular rhythm today Discussed pros and cons of anticoagulation, patient will continue aspirin and consider  # GERD Discussed common side effects of PPI Offered trial of H2 blocker twice daily, patient will consider, these are over-the-counter-Pepcid or Zantac  # Knee pain Unclear etiology, recommended supportive care including icing compression Consider meniscal injury is most likely etiology  # Insomnia Doing well with Ambien, refilled   Meds ordered this encounter  Medications  . zolpidem (AMBIEN) 10 MG tablet    Sig: Take 1 tablet  (10 mg total) by mouth at bedtime as needed for sleep.    Dispense:  30 tablet    Refill:  [place], MD [facility] 01/14/2017, 5:17 PM

## 2017-01-22 ENCOUNTER — Other Ambulatory Visit: Payer: Self-pay | Admitting: Family Medicine

## 2017-01-22 DIAGNOSIS — I6522 Occlusion and stenosis of left carotid artery: Secondary | ICD-10-CM

## 2017-01-25 NOTE — Telephone Encounter (Signed)
rx called into pharmacy

## 2017-01-25 NOTE — Telephone Encounter (Signed)
Last seen 01/14/17  Dr Wendi Snipes  If approved route to nurse to call into  [phone]

## 2017-02-16 ENCOUNTER — Telehealth: Payer: Self-pay

## 2017-02-16 NOTE — Telephone Encounter (Signed)
Patient wants an MRI

## 2017-02-16 NOTE — Telephone Encounter (Signed)
Would recommend ortho referral for knee pain rather than MRI.   Laroy Apple, MD [facility] 02/16/2017, 12:52 PM

## 2017-02-17 ENCOUNTER — Other Ambulatory Visit: Payer: Self-pay

## 2017-02-17 DIAGNOSIS — M25569 Pain in unspecified knee: Secondary | ICD-10-CM

## 2017-02-24 ENCOUNTER — Other Ambulatory Visit: Payer: Self-pay | Admitting: Family Medicine

## 2017-02-25 DIAGNOSIS — M5030 Other cervical disc degeneration, unspecified cervical region: Secondary | ICD-10-CM | POA: Diagnosis not present

## 2017-02-25 DIAGNOSIS — G894 Chronic pain syndrome: Secondary | ICD-10-CM | POA: Diagnosis not present

## 2017-02-25 DIAGNOSIS — M5136 Other intervertebral disc degeneration, lumbar region: Secondary | ICD-10-CM | POA: Diagnosis not present

## 2017-02-25 DIAGNOSIS — M47812 Spondylosis without myelopathy or radiculopathy, cervical region: Secondary | ICD-10-CM | POA: Diagnosis not present

## 2017-03-02 ENCOUNTER — Ambulatory Visit (INDEPENDENT_AMBULATORY_CARE_PROVIDER_SITE_OTHER): Payer: Medicaid Other | Admitting: Orthopaedic Surgery

## 2017-03-02 ENCOUNTER — Ambulatory Visit (INDEPENDENT_AMBULATORY_CARE_PROVIDER_SITE_OTHER): Payer: Medicaid Other

## 2017-03-02 ENCOUNTER — Encounter (INDEPENDENT_AMBULATORY_CARE_PROVIDER_SITE_OTHER): Payer: Self-pay | Admitting: Orthopaedic Surgery

## 2017-03-02 DIAGNOSIS — M25562 Pain in left knee: Secondary | ICD-10-CM

## 2017-03-02 DIAGNOSIS — M25561 Pain in right knee: Secondary | ICD-10-CM

## 2017-03-02 DIAGNOSIS — G8929 Other chronic pain: Secondary | ICD-10-CM | POA: Diagnosis not present
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
## 2017-03-19 ENCOUNTER — Encounter: Payer: Self-pay | Admitting: *Deleted

## 2017-03-27 ENCOUNTER — Other Ambulatory Visit: Payer: Self-pay | Admitting: Physician Assistant

## 2017-03-27 ENCOUNTER — Other Ambulatory Visit: Payer: Self-pay | Admitting: Family Medicine

## 2017-03-30 NOTE — Telephone Encounter (Signed)
Verified with pt he has gone back on his HCTZ

## 2017-04-06 ENCOUNTER — Ambulatory Visit: Payer: Self-pay | Admitting: Family Medicine

## 2017-04-15 ENCOUNTER — Encounter: Payer: Self-pay | Admitting: Family Medicine

## 2017-04-15 ENCOUNTER — Ambulatory Visit (INDEPENDENT_AMBULATORY_CARE_PROVIDER_SITE_OTHER): Payer: Medicaid Other | Admitting: Family Medicine

## 2017-04-15 VITALS — BP 107/66 | HR 57 | Temp 97.2°F | Ht 75.0 in | Wt 244.0 lb

## 2017-04-15 DIAGNOSIS — L570 Actinic keratosis: Secondary | ICD-10-CM | POA: Diagnosis not present

## 2017-04-15 DIAGNOSIS — K625 Hemorrhage of anus and rectum: Secondary | ICD-10-CM | POA: Diagnosis not present

## 2017-04-15 DIAGNOSIS — D649 Anemia, unspecified: Secondary | ICD-10-CM | POA: Diagnosis not present

## 2017-04-15 DIAGNOSIS — L0291 Cutaneous abscess, unspecified: Secondary | ICD-10-CM | POA: Diagnosis not present

## 2017-04-15 MED ORDER — ZOLPIDEM TARTRATE 10 MG PO TABS: 10.0000 mg | ORAL_TABLET | Freq: Every evening | ORAL | 5 refills | Status: DC | PRN

## 2017-04-15 MED ORDER — DULOXETINE HCL 20 MG PO CPEP
20.0000 mg | ORAL_CAPSULE | Freq: Every day | ORAL | 3 refills | Status: DC
Start: 2017-04-15 — End: 2018-01-08

## 2017-04-15 MED ORDER — DOXYCYCLINE HYCLATE 100 MG PO TABS: 100.0000 mg | ORAL_TABLET | Freq: Two times a day (BID) | ORAL | 0 refills | Status: DC

## 2017-04-15 MED ORDER — DOXEPIN HCL 6 MG PO TABS
6.0000 mg | ORAL_TABLET | Freq: Every day | ORAL | 2 refills | Status: DC
Start: 2017-04-15 — End: 2017-09-17

## 2017-04-15 NOTE — Patient Instructions (Signed)
Great to see you!  Come back in 3 months unless you need [place] sooner.   Stop trazodone, start doxepin to help you sleep. Do not take these two  together, you may use either one or the other  I have referred you to GI to investigate your rectal bleeding

## 2017-04-15 NOTE — Progress Notes (Signed)
HPI  Patient presents today here to follow-up for chronic medical conditions as well as abscess and rectal bleeding.  Anemia Discussed the patient, he sees blood in stool about 2 or 3 times a week. No abnormal weakness.  Abscess Patient states that he is now had 2 abscesses in his left groin. Patient has been treated with radiation for prostate cancer. He states that the first one showed up about 2 weeks ago and has almost resolved, the other one started about a week ago and is still tense and painful. No fever, chills, sweats, does not seem to be spreading, it has not drained.  Difficulty sleeping Patient last increase of Ambien. He's taking trazodone 300 mg plus Ambien to sleep. This is not as effective as it once was.  Abnormal PMH: Smoking status noted ROS: Per HPI  Objective: BP 107/66   Pulse (!) 57   Temp (!) 97.2 F (36.2 C) (Oral)   Ht 6' 3"  (1.905 m)   Wt 244 lb (110.7 kg)   BMI 30.50 kg/m  Gen: NAD, alert, cooperative with exam HEENT: NCAT CV: RRR, good S1/S2, no murmur Resp: CTABL, no wheezes, non-labored Ext: No edema, warm Neuro: Alert and oriented, No gross deficits Skin:  Left groin with a proximally 1 cm x 0.5 cm slightly indurated subcutaneous nodule consistent with superficial abscess, small lesion centrally located, no severe erythema, tenderness to palpation, or warmth, similar sized lesion that is purple in color and not indurated consistent with healing   Assessment and plan:  # Abscess Treatment doxycycline, given how small it is I have recommended avoiding lancing it for now, if it drains and will healed well. Discussed red flags for seeking additional medical care including spread to the surrounding area or severe pain  # Difficulty sleeping Continue Ambien, discussed 10 mg is the maximum dose Discontinue trazodone, trial of doxepin  # Normocytic anemia Patient states that he has stopped taking blood better due to rectal bleeding He  still has multiple episodes of rectal bleeding per week, see below Continue aspirin Recheck labs  # Rectal bleeding I discussed the serious this can be Recommended GI evaluation, referral placed     Orders Placed This Encounter  Procedures  . CBC with Differential/Platelet  . CMP14+EGFR  . Ambulatory referral to Gastroenterology    Referral Priority:   Routine    Referral Type:   Consultation    Referral Reason:   Specialty Services Required    Number of Visits Requested:   1    Meds ordered this encounter  Medications  . OxyCODONE ER (XTAMPZA ER) 18 MG C12A    Sig: Take by mouth.  . DULoxetine (CYMBALTA) 20 MG capsule    Sig: Take 1 capsule (20 mg total) by mouth daily.    Dispense:  90 capsule    Refill:  3  . zolpidem (AMBIEN) 10 MG tablet    Sig: Take 1 tablet (10 mg total) by mouth at bedtime as needed. for sleep    Dispense:  30 tablet    Refill:  5  . doxycycline (VIBRA-TABS) 100 MG tablet    Sig: Take 1 tablet (100 mg total) by mouth 2 (two) times daily. 1 po bid    Dispense:  20 tablet    Refill:  0  . Doxepin HCl 6 MG TABS    Sig: Take 1 tablet (6 mg total) by mouth at bedtime.    Dispense:  30 tablet    Refill:  [place], MD [facility] 04/15/2017, 9:23 AM

## 2017-04-26 ENCOUNTER — Other Ambulatory Visit: Payer: Self-pay | Admitting: Family Medicine

## 2017-04-27 ENCOUNTER — Encounter (INDEPENDENT_AMBULATORY_CARE_PROVIDER_SITE_OTHER): Payer: Self-pay | Admitting: Internal Medicine

## 2017-05-26 ENCOUNTER — Ambulatory Visit (INDEPENDENT_AMBULATORY_CARE_PROVIDER_SITE_OTHER): Payer: Self-pay | Admitting: Internal Medicine

## 2017-06-01 ENCOUNTER — Encounter (INDEPENDENT_AMBULATORY_CARE_PROVIDER_SITE_OTHER): Payer: Self-pay

## 2017-06-01 ENCOUNTER — Encounter (INDEPENDENT_AMBULATORY_CARE_PROVIDER_SITE_OTHER): Payer: Self-pay | Admitting: Internal Medicine

## 2017-06-01 ENCOUNTER — Telehealth (INDEPENDENT_AMBULATORY_CARE_PROVIDER_SITE_OTHER): Payer: Self-pay | Admitting: *Deleted

## 2017-06-01 ENCOUNTER — Encounter (INDEPENDENT_AMBULATORY_CARE_PROVIDER_SITE_OTHER): Payer: Self-pay | Admitting: *Deleted

## 2017-06-01 ENCOUNTER — Other Ambulatory Visit (INDEPENDENT_AMBULATORY_CARE_PROVIDER_SITE_OTHER): Payer: Self-pay | Admitting: Internal Medicine

## 2017-06-01 ENCOUNTER — Ambulatory Visit (INDEPENDENT_AMBULATORY_CARE_PROVIDER_SITE_OTHER): Payer: Medicaid Other | Admitting: Internal Medicine

## 2017-06-01 VITALS — BP 118/70 | HR 64 | Temp 98.0°F | Ht 75.0 in | Wt 237.7 lb

## 2017-06-01 DIAGNOSIS — K625 Hemorrhage of anus and rectum: Secondary | ICD-10-CM | POA: Diagnosis not present
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
## 2017-06-01 NOTE — Patient Instructions (Signed)
The risks of bleeding, perforation and infection were reviewed with patient.  

## 2017-06-01 NOTE — Telephone Encounter (Signed)
Patient needs trilyte 

## 2017-06-02 MED ORDER — PEG 3350-KCL-NA BICARB-NACL 420 G PO SOLR: 4000.0000 mL | Freq: Once | ORAL | 0 refills | Status: AC

## 2017-06-10 NOTE — Patient Instructions (Signed)
Calvin Aguirre  06/10/2017     @PREFPERIOPPHARMACY @   Your procedure is scheduled on  06/21/2017   Report to [name] at  615   A.M.  Call this number if you have problems the morning of surgery:  [phone]   Remember:  Do not eat food or drink liquids after midnight.  Take these medicines the morning of surgery with A SIP OF WATER  Flexaril, cymbalta, prilosec, oxycodone ER or oxycodone.   Do not wear jewelry, make-up or nail polish.  Do not wear lotions, powders, or perfumes, or deoderant.  Do not shave 48 hours prior to surgery.  Men may shave face and neck.  Do not bring valuables to the hospital.  [facility] is not responsible for any belongings or valuables.  Contacts, dentures or bridgework may not be worn into surgery.  Leave your suitcase in the car.  After surgery it may be brought to your room.  For patients admitted to the hospital, discharge time will be determined by your treatment team.  Patients discharged the day of surgery will not be allowed to drive home.   Name and phone number of your driver:   family Special instructions:  Follow the diet and prep instructions given to you by Dr Olevia Perches office.  Please read over the following fact sheets that you were given. Anesthesia Post-op Instructions and Care and Recovery After Surgery       Colonoscopy, Adult A colonoscopy is an exam to look at the entire large intestine. During the exam, a lubricated, bendable tube is inserted into the anus and then passed into the rectum, colon, and other parts of the large intestine. A colonoscopy is often done as a part of normal colorectal screening or in response to certain symptoms, such as anemia, persistent diarrhea, abdominal pain, and blood in the stool. The exam can help screen for and diagnose medical problems, including:  Tumors.  Polyps.  Inflammation.  Areas of bleeding.  Tell a health care provider about:  Any allergies you  have.  All medicines you are taking, including vitamins, herbs, eye drops, creams, and over-the-counter medicines.  Any problems you or family members have had with anesthetic medicines.  Any blood disorders you have.  Any surgeries you have had.  Any medical conditions you have.  Any problems you have had passing stool. What are the risks? Generally, this is a safe procedure. However, problems may occur, including:  Bleeding.  A tear in the intestine.  A reaction to medicines given during the exam.  Infection (rare).  What happens before the procedure? Eating and drinking restrictions Follow instructions from your health care provider about eating and drinking, which may include:  A few days before the procedure - follow a low-fiber diet. Avoid nuts, seeds, dried fruit, raw fruits, and vegetables.  1-3 days before the procedure - follow a clear liquid diet. Drink only clear liquids, such as clear broth or bouillon, black coffee or tea, clear juice, clear soft drinks or sports drinks, gelatin dessert, and popsicles. Avoid any liquids that contain red or purple dye.  On the day of the procedure - do not eat or drink anything during the 2 hours before the procedure, or within the time period that your health care provider recommends.  Bowel prep If you were prescribed an oral bowel prep to clean out your colon:  Take it as told by your health care provider. Starting the day  before your procedure, you will need to drink a large amount of medicated liquid. The liquid will cause you to have multiple loose stools until your stool is almost clear or light green.  If your skin or anus gets irritated from diarrhea, you may use these to relieve the irritation: ? Medicated wipes, such as adult wet wipes with aloe and vitamin E. ? A skin soothing-product like petroleum jelly.  If you vomit while drinking the bowel prep, take a break for up to 60 minutes and then begin the bowel prep  again. If vomiting continues and you cannot take the bowel prep without vomiting, call your health care provider.  General instructions  Ask your health care provider about changing or stopping your regular medicines. This is especially important if you are taking diabetes medicines or blood thinners.  Plan to have someone take you home from the hospital or clinic. What happens during the procedure?  An IV tube may be inserted into one of your veins.  You will be given medicine to help you relax (sedative).  To reduce your risk of infection: ? Your health care team will wash or sanitize their hands. ? Your anal area will be washed with soap.  You will be asked to lie on your side with your knees bent.  Your health care provider will lubricate a long, thin, flexible tube. The tube will have a camera and a light on the end.  The tube will be inserted into your anus.  The tube will be gently eased through your rectum and colon.  Air will be delivered into your colon to keep it open. You may feel some pressure or cramping.  The camera will be used to take images during the procedure.  A small tissue sample may be removed from your body to be examined under a microscope (biopsy). If any potential problems are found, the tissue will be sent to a lab for testing.  If small polyps are found, your health care provider may remove them and have them checked for cancer cells.  The tube that was inserted into your anus will be slowly removed. The procedure may vary among health care providers and hospitals. What happens after the procedure?  Your blood pressure, heart rate, breathing rate, and blood oxygen level will be monitored until the medicines you were given have worn off.  Do not drive for 24 hours after the exam.  You may have a small amount of blood in your stool.  You may pass gas and have mild abdominal cramping or bloating due to the air that was used to inflate your colon  during the exam.  It is up to you to get the results of your procedure. Ask your health care provider, or the department performing the procedure, when your results will be ready. This information is not intended to replace advice given to you by your health care provider. Make sure you discuss any questions you have with your health care provider. Document Released: 07/10/2000 Document Revised: 05/13/2016 Document Reviewed: 09/24/2015 Elsevier Interactive Patient Education  2018 [employer].  Colonoscopy, Adult, Care After This sheet gives you information about how to care for yourself after your procedure. Your health care provider may also give you more specific instructions. If you have problems or questions, contact your health care provider. What can I expect after the procedure? After the procedure, it is common to have:  A small amount of blood in your stool for 24 hours after  the procedure.  Some gas.  Mild abdominal cramping or bloating.  Follow these instructions at home: General instructions   For the first 24 hours after the procedure: ? Do not drive or use machinery. ? Do not sign important documents. ? Do not drink alcohol. ? Do your regular daily activities at a slower pace than normal. ? Eat soft, easy-to-digest foods. ? Rest often.  Take over-the-counter or prescription medicines only as told by your health care provider.  It is up to you to get the results of your procedure. Ask your health care provider, or the department performing the procedure, when your results will be ready. Relieving cramping and bloating  Try walking around when you have cramps or feel bloated.  Apply heat to your abdomen as told by your health care provider. Use a heat source that your health care provider recommends, such as a moist heat pack or a heating pad. ? Place a towel between your skin and the heat source. ? Leave the heat on for 20-30 minutes. ? Remove the heat if your  skin turns bright red. This is especially important if you are unable to feel pain, heat, or cold. You may have a greater risk of getting burned. Eating and drinking  Drink enough fluid to keep your urine clear or pale yellow.  Resume your normal diet as instructed by your health care provider. Avoid heavy or fried foods that are hard to digest.  Avoid drinking alcohol for as long as instructed by your health care provider. Contact a health care provider if:  You have blood in your stool 2-3 days after the procedure. Get help right away if:  You have more than a small spotting of blood in your stool.  You pass large blood clots in your stool.  Your abdomen is swollen.  You have nausea or vomiting.  You have a fever.  You have increasing abdominal pain that is not relieved with medicine. This information is not intended to replace advice given to you by your health care provider. Make sure you discuss any questions you have with your health care provider. Document Released: 02/25/2004 Document Revised: 04/06/2016 Document Reviewed: 09/24/2015 Elsevier Interactive Patient Education  2018 [employer] Anesthesia is a term that refers to techniques, procedures, and medicines that help a person stay safe and comfortable during a medical procedure. Monitored anesthesia care, or sedation, is one type of anesthesia. Your anesthesia specialist may recommend sedation if you will be having a procedure that does not require you to be unconscious, such as:  Cataract surgery.  A dental procedure.  A biopsy.  A colonoscopy.  During the procedure, you may receive a medicine to help you relax (sedative). There are three levels of sedation:  Mild sedation. At this level, you may feel awake and relaxed. You will be able to follow directions.  Moderate sedation. At this level, you will be sleepy. You may not remember the procedure.  Deep sedation. At this level,  you will be asleep. You will not remember the procedure.  The more medicine you are given, the deeper your level of sedation will be. Depending on how you respond to the procedure, the anesthesia specialist may change your level of sedation or the type of anesthesia to fit your needs. An anesthesia specialist will monitor you closely during the procedure. Let your health care provider know about:  Any allergies you have.  All medicines you are taking, including vitamins, herbs, eye drops,  creams, and over-the-counter medicines.  Any use of steroids (by mouth or as a cream).  Any problems you or family members have had with sedatives and anesthetic medicines.  Any blood disorders you have.  Any surgeries you have had.  Any medical conditions you have, such as sleep apnea.  Whether you are pregnant or may be pregnant.  Any use of cigarettes, alcohol, or street drugs. What are the risks? Generally, this is a safe procedure. However, problems may occur, including:  Getting too much medicine (oversedation).  Nausea.  Allergic reaction to medicines.  Trouble breathing. If this happens, a breathing tube may be used to help with breathing. It will be removed when you are awake and breathing on your own.  Heart trouble.  Lung trouble.  Before the procedure Staying hydrated Follow instructions from your health care provider about hydration, which may include:  Up to 2 hours before the procedure - you may continue to drink clear liquids, such as water, clear fruit juice, black coffee, and plain tea.  Eating and drinking restrictions Follow instructions from your health care provider about eating and drinking, which may include:  8 hours before the procedure - stop eating heavy meals or foods such as meat, fried foods, or fatty foods.  6 hours before the procedure - stop eating light meals or foods, such as toast or cereal.  6 hours before the procedure - stop drinking milk or  drinks that contain milk.  2 hours before the procedure - stop drinking clear liquids.  Medicines Ask your health care provider about:  Changing or stopping your regular medicines. This is especially important if you are taking diabetes medicines or blood thinners.  Taking medicines such as aspirin and ibuprofen. These medicines can thin your blood. Do not take these medicines before your procedure if your health care provider instructs you not to.  Tests and exams  You will have a physical exam.  You may have blood tests done to show: ? How well your kidneys and liver are working. ? How well your blood can clot.  General instructions  Plan to have someone take you home from the hospital or clinic.  If you will be going home right after the procedure, plan to have someone with you for 24 hours.  What happens during the procedure?  Your blood pressure, heart rate, breathing, level of pain and overall condition will be monitored.  An IV tube will be inserted into one of your veins.  Your anesthesia specialist will give you medicines as needed to keep you comfortable during the procedure. This may mean changing the level of sedation.  The procedure will be performed. After the procedure  Your blood pressure, heart rate, breathing rate, and blood oxygen level will be monitored until the medicines you were given have worn off.  Do not drive for 24 hours if you received a sedative.  You may: ? Feel sleepy, clumsy, or nauseous. ? Feel forgetful about what happened after the procedure. ? Have a sore throat if you had a breathing tube during the procedure. ? Vomit. This information is not intended to replace advice given to you by your health care provider. Make sure you discuss any questions you have with your health care provider. Document Released: 04/08/2005 Document Revised: 12/20/2015 Document Reviewed: 11/03/2015 Elsevier Interactive Patient Education  [employer].

## 2017-06-14 ENCOUNTER — Encounter (HOSPITAL_COMMUNITY)
Admission: RE | Admit: 2017-06-14 | Discharge: 2017-06-14 | Disposition: A | Discharge status: Home / Self Care | Payer: Medicaid Other | Source: Ambulatory Visit | Attending: Internal Medicine | Admitting: Internal Medicine

## 2017-06-16 ENCOUNTER — Telehealth: Payer: Self-pay | Admitting: Family Medicine

## 2017-06-16 ENCOUNTER — Other Ambulatory Visit: Payer: Self-pay

## 2017-06-16 ENCOUNTER — Encounter (HOSPITAL_COMMUNITY): Payer: Self-pay

## 2017-06-16 ENCOUNTER — Encounter (HOSPITAL_COMMUNITY)
Admission: RE | Admit: 2017-06-16 | Discharge: 2017-06-16 | Disposition: A | Discharge status: Home / Self Care | Payer: Medicaid Other | Source: Ambulatory Visit | Attending: Internal Medicine | Admitting: Internal Medicine

## 2017-06-16 ENCOUNTER — Telehealth: Payer: Self-pay | Admitting: *Deleted

## 2017-06-16 DIAGNOSIS — R001 Bradycardia, unspecified: Secondary | ICD-10-CM | POA: Diagnosis not present

## 2017-06-16 DIAGNOSIS — Z01812 Encounter for preprocedural laboratory examination: Secondary | ICD-10-CM | POA: Insufficient documentation

## 2017-06-16 DIAGNOSIS — R9431 Abnormal electrocardiogram [ECG] [EKG]: Secondary | ICD-10-CM | POA: Insufficient documentation

## 2017-06-16 DIAGNOSIS — Z01818 Encounter for other preprocedural examination: Secondary | ICD-10-CM | POA: Diagnosis not present

## 2017-06-16 HISTORY — DX: Other symptoms and signs involving the nervous system: R29.818

## 2017-06-16 LAB — BASIC METABOLIC PANEL
ANION GAP: 6 (ref 5–15)
BUN: 7 mg/dL (ref 6–20)
CALCIUM: 8.7 mg/dL — AB (ref 8.9–10.3)
CHLORIDE: 102 mmol/L (ref 101–111)
CO2: 28 mmol/L (ref 22–32)
Creatinine, Ser: 1.19 mg/dL (ref 0.61–1.24)
GFR calc non Af Amer: >60 mL/min (ref >60)
Glucose, Bld: 76 mg/dL (ref 65–99)
Potassium: 4 mmol/L (ref 3.5–5.1)
SODIUM: 136 mmol/L (ref 135–145)

## 2017-06-16 LAB — CBC WITH DIFFERENTIAL/PLATELET
Basophils Absolute: 0 10*3/uL (ref 0.0–0.1)
Basophils Relative: 1 %
Eosinophils Absolute: 0.4 10*3/uL (ref 0.0–0.7)
Eosinophils Relative: 7 %
HEMATOCRIT: 37.9 % — AB (ref 39.0–52.0)
Hemoglobin: 12.6 g/dL — ABNORMAL LOW (ref 13.0–17.0)
LYMPHS ABS: 1.4 10*3/uL (ref 0.7–4.0)
Lymphocytes Relative: 26 %
MCH: 31.4 pg (ref 26.0–34.0)
MCHC: 33.2 g/dL (ref 30.0–36.0)
MCV: 94.5 fL (ref 78.0–100.0)
MONO ABS: 0.7 10*3/uL (ref 0.1–1.0)
MONOS PCT: 12 %
NEUTROS ABS: 3 10*3/uL (ref 1.7–7.7)
NEUTROS PCT: 54 %
Platelets: 192 10*3/uL (ref 150–400)
RBC: 4.01 MIL/uL — AB (ref 4.22–5.81)
RDW: 13.4 % (ref 11.5–15.5)
WBC: 5.6 10*3/uL (ref 4.0–10.5)

## 2017-06-16 NOTE — Telephone Encounter (Signed)
Patient is requesting a prescription for chuck pads for bladder leakage. Bradshaw's patient

## 2017-06-16 NOTE — Telephone Encounter (Signed)
Suspect patient with regards to urinary leaking.  He reports that this is new onset over the last several weeks.  He reports that when he sleeps he leaks urine.  He denies urinary incontinence or difficulty urinating during the day.  No dysuria, hematuria, abdominal pain, nausea, vomiting, fevers.  I did offer him incontinence supplies, including adult diapers.  He declined these.  He is asking for chux to put on bed at nighttime.   Patient has a past medical history of prostate cancer, who he sees Dr. Alinda Money for.  He received radiation treatment for this.  This may be the etiology of his urinary leaking.  Though, without evaluation cannot rule out other causes.  For this reason, I did recommend that he follow-up with his PCP in the next week with regards to this matter.  In the interim, we will work on obtaining requested supplies.  I did inform him this may take a few days given the holiday.  Mahagony Grieb M. Lajuana Ripple, [facility]

## 2017-06-16 NOTE — Telephone Encounter (Signed)
Addressed.  See separate telephone note.  Will work on getting this for patient

## 2017-06-21 ENCOUNTER — Ambulatory Visit (HOSPITAL_COMMUNITY): Admission: RE | Admit: 2017-06-21 | Payer: Medicaid Other | Source: Ambulatory Visit | Admitting: Internal Medicine

## 2017-06-21 ENCOUNTER — Encounter (HOSPITAL_COMMUNITY): Admission: RE | Payer: Self-pay | Source: Ambulatory Visit

## 2017-06-21 SURGERY — COLONOSCOPY WITH PROPOFOL
Anesthesia: Monitor Anesthesia Care

## 2017-06-25 ENCOUNTER — Other Ambulatory Visit: Payer: Self-pay | Admitting: Family Medicine

## 2017-06-26 ENCOUNTER — Encounter (HOSPITAL_COMMUNITY): Payer: Self-pay

## 2017-06-26 ENCOUNTER — Other Ambulatory Visit: Payer: Self-pay | Admitting: Physician Assistant

## 2017-06-26 ENCOUNTER — Ambulatory Visit (HOSPITAL_COMMUNITY): Admit: 2017-06-26 | Payer: Medicaid Other | Admitting: Internal Medicine

## 2017-06-26 SURGERY — COLONOSCOPY
Anesthesia: Moderate Sedation

## 2017-07-12 ENCOUNTER — Ambulatory Visit: Payer: Medicaid Other | Admitting: Family Medicine

## 2017-07-13 ENCOUNTER — Ambulatory Visit: Payer: Medicaid Other | Admitting: Family Medicine

## 2017-07-13 ENCOUNTER — Encounter: Payer: Self-pay | Admitting: Family Medicine

## 2017-07-13 VITALS — BP 108/63 | HR 62 | Temp 97.3°F | Ht 75.0 in | Wt 241.0 lb

## 2017-07-13 DIAGNOSIS — Z1211 Encounter for screening for malignant neoplasm of colon: Secondary | ICD-10-CM | POA: Diagnosis not present

## 2017-07-13 DIAGNOSIS — M25562 Pain in left knee: Secondary | ICD-10-CM | POA: Diagnosis not present

## 2017-07-13 DIAGNOSIS — R32 Unspecified urinary incontinence: Secondary | ICD-10-CM

## 2017-07-13 NOTE — Progress Notes (Signed)
   HPI  Patient presents today for left leg pain, urinary incontinence  Urinary incontinence After surgery for prostate cancer. Needs prescription for blue chucks to sleep on.  Left knee pain . Patient has had left knee pain for months several months. He has had off-and-on symptoms.  We discussed this in June and he was seen by orthopedics in August. He now has left medial knee pain that radiates down to his left foot. Is a history of back surgery by Dr. Gladstone Lighter at [facility], he would like to see him to discuss his knee pain whenever he comes to our clinic in 2 months (afterward I have seen he has seen Dr. Erlinda Hong, referrals coordinator will make sure he realizes this would be an easier alternative than waiting 2 months)   PMH: Smoking status noted ROS: Per HPI  Objective: BP 108/63   Pulse 62   Temp (!) 97.3 F (36.3 C) (Oral)   Ht 6\' 3"  (1.905 m)   Wt 241 lb (109.3 kg)   BMI 30.12 kg/m  Gen: NAD, alert, cooperative with exam HEENT: NCAT CV: RRR, good S1/S2, no murmur Resp: CTABL, no wheezes, non-labored Ext: No edema, warm Neuro: Alert and oriented MSK: L knee without erythema, effusion, bruising, or gross deformity No joint line tenderness.  ligamentously intact to Lachman's and with varus and valgus stress.  Negative McMurray's test   Assessment and plan:  #Knee pain Unusual distribution of pain radiating from left medial knee to the foot. Consider radiculopathy Recommend follow-up with orthopedics, he was not forthcoming with who his orthopedic surgeon was, our girls coordinator will discuss this with him.  #Urinary incontinence After prostate surgery Prescription for absorbent pads given  #Colon cancer screening Discussed, patient would like to avoid colonoscopy if possible Normal colonoscopy in July 2013. He would like to consider Cologuard- looking into if this Iscovered by insurance.     Orders Placed This Encounter  Procedures  .  Ambulatory referral to Orthopedic Surgery    Referral Priority:   Routine    Referral Type:   Surgical    Referral Reason:   Specialty Services Required    Referred to Provider:   Latanya Maudlin, MD    Requested Specialty:   Orthopedic Surgery    Number of Visits Requested:   [place], MD [facility] 07/13/2017, 11:47 AM

## 2017-07-13 NOTE — Patient Instructions (Signed)
Great to see you!   

## 2017-07-14 ENCOUNTER — Other Ambulatory Visit (INDEPENDENT_AMBULATORY_CARE_PROVIDER_SITE_OTHER): Payer: Self-pay | Admitting: Internal Medicine

## 2017-07-14 DIAGNOSIS — K625 Hemorrhage of anus and rectum: Secondary | ICD-10-CM

## 2017-07-22 ENCOUNTER — Telehealth: Payer: Self-pay | Admitting: Family Medicine

## 2017-07-22 ENCOUNTER — Other Ambulatory Visit (HOSPITAL_COMMUNITY): Payer: Medicaid Other

## 2017-07-22 NOTE — Telephone Encounter (Signed)
Patient aware form filled out for patient to come by and sign.

## 2017-07-23 ENCOUNTER — Other Ambulatory Visit: Payer: Self-pay | Admitting: Family Medicine

## 2017-07-28 ENCOUNTER — Other Ambulatory Visit: Payer: Self-pay | Admitting: Family Medicine

## 2017-07-28 ENCOUNTER — Ambulatory Visit (HOSPITAL_COMMUNITY): Admit: 2017-07-28 | Payer: Medicaid Other | Admitting: Internal Medicine

## 2017-07-28 ENCOUNTER — Encounter (HOSPITAL_COMMUNITY): Payer: Self-pay

## 2017-07-28 ENCOUNTER — Other Ambulatory Visit: Payer: Self-pay | Admitting: Physician Assistant

## 2017-07-28 DIAGNOSIS — I6522 Occlusion and stenosis of left carotid artery: Secondary | ICD-10-CM

## 2017-07-28 SURGERY — COLONOSCOPY WITH PROPOFOL
Anesthesia: Monitor Anesthesia Care

## 2017-08-03 ENCOUNTER — Other Ambulatory Visit (INDEPENDENT_AMBULATORY_CARE_PROVIDER_SITE_OTHER): Payer: Self-pay | Admitting: Internal Medicine

## 2017-08-03 ENCOUNTER — Encounter (INDEPENDENT_AMBULATORY_CARE_PROVIDER_SITE_OTHER): Payer: Self-pay | Admitting: *Deleted

## 2017-08-03 DIAGNOSIS — K625 Hemorrhage of anus and rectum: Secondary | ICD-10-CM

## 2017-09-09 NOTE — Patient Instructions (Signed)
Calvin Aguirre  09/09/2017     @PREFPERIOPPHARMACY @   Your procedure is scheduled on 09/17/2017.  Report to [name] at 6:15 A.M.  Call this number if you have problems the morning of surgery:  [phone]   Remember:  Do not eat food or drink liquids after midnight.  Take these medicines the morning of surgery with A SIP OF WATER Albuterol inhaler and bring with you, Flexeril, Doxepin, Cymbalta, Prilosec  Oxy ER OR Oxycodone if needed, Phenergan if needed   Do not wear jewelry, make-up or nail polish.  Do not wear lotions, powders, or perfumes, or deodorant.  Do not shave 48 hours prior to surgery.  Men may shave face and neck.  Do not bring valuables to the hospital.  [facility] is not responsible for any belongings or valuables.  Contacts, dentures or bridgework may not be worn into surgery.  Leave your suitcase in the car.  After surgery it may be brought to your room.  For patients admitted to the hospital, discharge time will be determined by your treatment team.  Patients discharged the day of surgery will not be allowed to drive home.    Please read over the following fact sheets that you were given. Anesthesia Post-op Instructions     PATIENT INSTRUCTIONS POST-ANESTHESIA  IMMEDIATELY FOLLOWING SURGERY:  Do not drive or operate machinery for the first twenty four hours after surgery.  Do not make any important decisions for twenty four hours after surgery or while taking narcotic pain medications or sedatives.  If you develop intractable nausea and vomiting or a severe headache please notify your doctor immediately.  FOLLOW-UP:  Please make an appointment with your surgeon as instructed. You do not need to follow up with anesthesia unless specifically instructed to do so.  WOUND CARE INSTRUCTIONS (if applicable):  Keep a dry clean dressing on the anesthesia/puncture wound site if there is drainage.  Once the wound has quit draining you may leave it open to air.   Generally you should leave the bandage intact for twenty four hours unless there is drainage.  If the epidural site drains for more than 36-48 hours please call the anesthesia department.  QUESTIONS?:  Please feel free to call your physician or the hospital operator if you have any questions, and they will be happy to assist you.      Colonoscopy, Adult A colonoscopy is an exam to look at the entire large intestine. During the exam, a lubricated, bendable tube is inserted into the anus and then passed into the rectum, colon, and other parts of the large intestine. A colonoscopy is often done as a part of normal colorectal screening or in response to certain symptoms, such as anemia, persistent diarrhea, abdominal pain, and blood in the stool. The exam can help screen for and diagnose medical problems, including:  Tumors.  Polyps.  Inflammation.  Areas of bleeding.  Tell a health care provider about:  Any allergies you have.  All medicines you are taking, including vitamins, herbs, eye drops, creams, and over-the-counter medicines.  Any problems you or family members have had with anesthetic medicines.  Any blood disorders you have.  Any surgeries you have had.  Any medical conditions you have.  Any problems you have had passing stool. What are the risks? Generally, this is a safe procedure. However, problems may occur, including:  Bleeding.  A tear in the intestine.  A reaction to medicines given during the exam.  Infection (rare).  What happens before the procedure? Eating and drinking restrictions Follow instructions from your health care provider about eating and drinking, which may include:  A few days before the procedure - follow a low-fiber diet. Avoid nuts, seeds, dried fruit, raw fruits, and vegetables.  1-3 days before the procedure - follow a clear liquid diet. Drink only clear liquids, such as clear broth or bouillon, black coffee or tea, clear juice,  clear soft drinks or sports drinks, gelatin dessert, and popsicles. Avoid any liquids that contain red or purple dye.  On the day of the procedure - do not eat or drink anything during the 2 hours before the procedure, or within the time period that your health care provider recommends.  Bowel prep If you were prescribed an oral bowel prep to clean out your colon:  Take it as told by your health care provider. Starting the day before your procedure, you will need to drink a large amount of medicated liquid. The liquid will cause you to have multiple loose stools until your stool is almost clear or light green.  If your skin or anus gets irritated from diarrhea, you may use these to relieve the irritation: ? Medicated wipes, such as adult wet wipes with aloe and vitamin E. ? A skin soothing-product like petroleum jelly.  If you vomit while drinking the bowel prep, take a break for up to 60 minutes and then begin the bowel prep again. If vomiting continues and you cannot take the bowel prep without vomiting, call your health care provider.  General instructions  Ask your health care provider about changing or stopping your regular medicines. This is especially important if you are taking diabetes medicines or blood thinners.  Plan to have someone take you home from the hospital or clinic. What happens during the procedure?  An IV tube may be inserted into one of your veins.  You will be given medicine to help you relax (sedative).  To reduce your risk of infection: ? Your health care team will wash or sanitize their hands. ? Your anal area will be washed with soap.  You will be asked to lie on your side with your knees bent.  Your health care provider will lubricate a long, thin, flexible tube. The tube will have a camera and a light on the end.  The tube will be inserted into your anus.  The tube will be gently eased through your rectum and colon.  Air will be delivered into your  colon to keep it open. You may feel some pressure or cramping.  The camera will be used to take images during the procedure.  A small tissue sample may be removed from your body to be examined under a microscope (biopsy). If any potential problems are found, the tissue will be sent to a lab for testing.  If small polyps are found, your health care provider may remove them and have them checked for cancer cells.  The tube that was inserted into your anus will be slowly removed. The procedure may vary among health care providers and hospitals. What happens after the procedure?  Your blood pressure, heart rate, breathing rate, and blood oxygen level will be monitored until the medicines you were given have worn off.  Do not drive for 24 hours after the exam.  You may have a small amount of blood in your stool.  You may pass gas and have mild abdominal cramping or bloating due to the air that was used  to inflate your colon during the exam.  It is up to you to get the results of your procedure. Ask your health care provider, or the department performing the procedure, when your results will be ready. This information is not intended to replace advice given to you by your health care provider. Make sure you discuss any questions you have with your health care provider. Document Released: 07/10/2000 Document Revised: 05/13/2016 Document Reviewed: 09/24/2015 Elsevier Interactive Patient Education  2018 [employer].

## 2017-09-10 ENCOUNTER — Encounter (HOSPITAL_COMMUNITY)
Admission: RE | Admit: 2017-09-10 | Discharge: 2017-09-10 | Disposition: A | Discharge status: Home / Self Care | Payer: Medicaid Other | Source: Ambulatory Visit | Attending: Internal Medicine | Admitting: Internal Medicine

## 2017-09-10 ENCOUNTER — Encounter (HOSPITAL_COMMUNITY): Payer: Self-pay

## 2017-09-10 ENCOUNTER — Other Ambulatory Visit: Payer: Self-pay

## 2017-09-10 DIAGNOSIS — K625 Hemorrhage of anus and rectum: Secondary | ICD-10-CM | POA: Diagnosis not present

## 2017-09-10 DIAGNOSIS — Z01812 Encounter for preprocedural laboratory examination: Secondary | ICD-10-CM | POA: Insufficient documentation

## 2017-09-10 HISTORY — DX: Major depressive disorder, single episode, unspecified: F32.9

## 2017-09-10 HISTORY — DX: Depression, unspecified: F32.A

## 2017-09-10 HISTORY — DX: Anxiety disorder, unspecified: F41.9

## 2017-09-10 LAB — BASIC METABOLIC PANEL
Anion gap: 8 (ref 5–15)
BUN: 7 mg/dL (ref 6–20)
CALCIUM: 8.5 mg/dL — AB (ref 8.9–10.3)
CO2: 28 mmol/L (ref 22–32)
CREATININE: 1 mg/dL (ref 0.61–1.24)
Chloride: 99 mmol/L — ABNORMAL LOW (ref 101–111)
Glucose, Bld: 109 mg/dL — ABNORMAL HIGH (ref 65–99)
Potassium: 3.2 mmol/L — ABNORMAL LOW (ref 3.5–5.1)
SODIUM: 135 mmol/L (ref 135–145)

## 2017-09-10 LAB — CBC
HEMATOCRIT: 34.9 % — AB (ref 39.0–52.0)
Hemoglobin: 11.9 g/dL — ABNORMAL LOW (ref 13.0–17.0)
MCH: 31.4 pg (ref 26.0–34.0)
MCHC: 34.1 g/dL (ref 30.0–36.0)
MCV: 92.1 fL (ref 78.0–100.0)
PLATELETS: 232 10*3/uL (ref 150–400)
RBC: 3.79 MIL/uL — AB (ref 4.22–5.81)
RDW: 13.6 % (ref 11.5–15.5)
WBC: 5.9 10*3/uL (ref 4.0–10.5)

## 2017-09-13 NOTE — Pre-Procedure Instructions (Signed)
Dr Laural Golden aware of potassium. He will address this. Will do Istat on arrival.

## 2017-09-14 ENCOUNTER — Other Ambulatory Visit (INDEPENDENT_AMBULATORY_CARE_PROVIDER_SITE_OTHER): Payer: Self-pay | Admitting: Internal Medicine

## 2017-09-14 MED ORDER — POTASSIUM CHLORIDE CRYS ER 20 MEQ PO TBCR: EXTENDED_RELEASE_TABLET | ORAL | 1 refills | Status: DC

## 2017-09-17 ENCOUNTER — Ambulatory Visit (HOSPITAL_COMMUNITY)
Admission: RE | Admit: 2017-09-17 | Discharge: 2017-09-17 | Disposition: A | Discharge status: Home / Self Care | Payer: Medicaid Other | Source: Ambulatory Visit | Attending: Internal Medicine | Admitting: Internal Medicine

## 2017-09-17 ENCOUNTER — Encounter (HOSPITAL_COMMUNITY)
Admission: RE | Disposition: A | Discharge status: Home / Self Care | Payer: Self-pay | Source: Ambulatory Visit | Attending: Internal Medicine

## 2017-09-17 ENCOUNTER — Ambulatory Visit (HOSPITAL_COMMUNITY): Payer: Medicaid Other | Admitting: Anesthesiology

## 2017-09-17 DIAGNOSIS — Z79899 Other long term (current) drug therapy: Secondary | ICD-10-CM | POA: Diagnosis not present

## 2017-09-17 DIAGNOSIS — G4700 Insomnia, unspecified: Secondary | ICD-10-CM | POA: Diagnosis not present

## 2017-09-17 DIAGNOSIS — Z8546 Personal history of malignant neoplasm of prostate: Secondary | ICD-10-CM | POA: Insufficient documentation

## 2017-09-17 DIAGNOSIS — K219 Gastro-esophageal reflux disease without esophagitis: Secondary | ICD-10-CM | POA: Diagnosis not present

## 2017-09-17 DIAGNOSIS — F329 Major depressive disorder, single episode, unspecified: Secondary | ICD-10-CM | POA: Insufficient documentation

## 2017-09-17 DIAGNOSIS — Z8673 Personal history of transient ischemic attack (TIA), and cerebral infarction without residual deficits: Secondary | ICD-10-CM | POA: Insufficient documentation

## 2017-09-17 DIAGNOSIS — I10 Essential (primary) hypertension: Secondary | ICD-10-CM | POA: Diagnosis not present

## 2017-09-17 DIAGNOSIS — Z85828 Personal history of other malignant neoplasm of skin: Secondary | ICD-10-CM | POA: Diagnosis not present

## 2017-09-17 DIAGNOSIS — Z923 Personal history of irradiation: Secondary | ICD-10-CM | POA: Diagnosis not present

## 2017-09-17 DIAGNOSIS — K633 Ulcer of intestine: Secondary | ICD-10-CM | POA: Insufficient documentation

## 2017-09-17 DIAGNOSIS — E785 Hyperlipidemia, unspecified: Secondary | ICD-10-CM | POA: Insufficient documentation

## 2017-09-17 DIAGNOSIS — K921 Melena: Secondary | ICD-10-CM | POA: Diagnosis not present

## 2017-09-17 DIAGNOSIS — F17210 Nicotine dependence, cigarettes, uncomplicated: Secondary | ICD-10-CM | POA: Diagnosis not present

## 2017-09-17 DIAGNOSIS — F419 Anxiety disorder, unspecified: Secondary | ICD-10-CM | POA: Diagnosis not present

## 2017-09-17 DIAGNOSIS — Z7982 Long term (current) use of aspirin: Secondary | ICD-10-CM | POA: Diagnosis not present

## 2017-09-17 DIAGNOSIS — K520 Gastroenteritis and colitis due to radiation: Secondary | ICD-10-CM | POA: Diagnosis not present

## 2017-09-17 DIAGNOSIS — K5520 Angiodysplasia of colon without hemorrhage: Secondary | ICD-10-CM | POA: Diagnosis not present

## 2017-09-17 DIAGNOSIS — D649 Anemia, unspecified: Secondary | ICD-10-CM | POA: Insufficient documentation

## 2017-09-17 DIAGNOSIS — K625 Hemorrhage of anus and rectum: Secondary | ICD-10-CM | POA: Insufficient documentation

## 2017-09-17 DIAGNOSIS — K644 Residual hemorrhoidal skin tags: Secondary | ICD-10-CM | POA: Insufficient documentation

## 2017-09-17 HISTORY — PX: COLONOSCOPY WITH PROPOFOL: SHX5780

## 2017-09-17 HISTORY — PX: BIOPSY: SHX5522

## 2017-09-17 SURGERY — COLONOSCOPY WITH PROPOFOL
Anesthesia: Monitor Anesthesia Care

## 2017-09-17 MED ORDER — MIDAZOLAM HCL 2 MG/2ML IJ SOLN
INTRAMUSCULAR | Status: AC
  Filled 2017-09-17: qty 2

## 2017-09-17 MED ORDER — PROPOFOL 10 MG/ML IV BOLUS
INTRAVENOUS | Status: AC
Start: 2017-09-17 — End: ?
  Filled 2017-09-17: qty 40

## 2017-09-17 MED ORDER — MIDAZOLAM HCL 2 MG/2ML IJ SOLN
1.0000 mg | INTRAMUSCULAR | Status: AC
  Administered 2017-09-17: 2 mg via INTRAVENOUS

## 2017-09-17 MED ORDER — SODIUM CHLORIDE 0.9% FLUSH
INTRAVENOUS | Status: AC
  Filled 2017-09-17: qty 20

## 2017-09-17 MED ORDER — MIDAZOLAM HCL 5 MG/5ML IJ SOLN
INTRAMUSCULAR | Status: DC | PRN
  Administered 2017-09-17: 2 mg via INTRAVENOUS

## 2017-09-17 MED ORDER — PROPOFOL 500 MG/50ML IV EMUL
INTRAVENOUS | Status: DC | PRN
  Administered 2017-09-17: 125 ug/kg/min via INTRAVENOUS

## 2017-09-17 MED ORDER — CHLORHEXIDINE GLUCONATE CLOTH 2 % EX PADS: 6.0000 | MEDICATED_PAD | Freq: Once | CUTANEOUS | Status: DC

## 2017-09-17 MED ORDER — LACTATED RINGERS IV SOLN
INTRAVENOUS | Status: DC
  Administered 2017-09-17: 07:00:00 via INTRAVENOUS

## 2017-09-17 MED ORDER — FENTANYL CITRATE (PF) 100 MCG/2ML IJ SOLN
INTRAMUSCULAR | Status: AC
  Filled 2017-09-17: qty 2

## 2017-09-17 MED ORDER — FENTANYL CITRATE (PF) 100 MCG/2ML IJ SOLN
25.0000 ug | Freq: Once | INTRAMUSCULAR | Status: AC
  Administered 2017-09-17: 25 ug via INTRAVENOUS

## 2017-09-17 NOTE — Discharge Instructions (Signed)
Keep ibuprofen use to minimum. Resume aspirin on 09/18/2017. Resume other medications as before. Resume usual diet. No driving for 24 hours. Physician will call with biopsy results and further recommendations.    Colonoscopy, Adult, Care After This sheet gives you information about how to care for yourself after your procedure. Your health care provider may also give you more specific instructions. If you have problems or questions, contact your health care provider. What can I expect after the procedure? After the procedure, it is common to have:  A small amount of blood in your stool for 24 hours after the procedure.  Some gas.  Mild abdominal cramping or bloating.  Follow these instructions at home: General instructions   For the first 24 hours after the procedure: ? Do not drive or use machinery. ? Do not sign important documents. ? Do not drink alcohol. ? Do your regular daily activities at a slower pace than normal. ? Eat soft, easy-to-digest foods. ? Rest often.  Take over-the-counter or prescription medicines only as told by your health care provider.  It is up to you to get the results of your procedure. Ask your health care provider, or the department performing the procedure, when your results will be ready. Relieving cramping and bloating  Try walking around when you have cramps or feel bloated.  Apply heat to your abdomen as told by your health care provider. Use a heat source that your health care provider recommends, such as a moist heat pack or a heating pad. ? Place a towel between your skin and the heat source. ? Leave the heat on for 20-30 minutes. ? Remove the heat if your skin turns bright red. This is especially important if you are unable to feel pain, heat, or cold. You may have a greater risk of getting burned. Eating and drinking  Drink enough fluid to keep your urine clear or pale yellow.  Resume your normal diet as instructed by your health  care provider. Avoid heavy or fried foods that are hard to digest.  Avoid drinking alcohol for as long as instructed by your health care provider. Contact a health care provider if:  You have blood in your stool 2-3 days after the procedure. Get help right away if:  You have more than a small spotting of blood in your stool.  You pass large blood clots in your stool.  Your abdomen is swollen.  You have nausea or vomiting.  You have a fever.  You have increasing abdominal pain that is not relieved with medicine. This information is not intended to replace advice given to you by your health care provider. Make sure you discuss any questions you have with your health care provider. Document Released: 02/25/2004 Document Revised: 04/06/2016 Document Reviewed: 09/24/2015 Elsevier Interactive Patient Education  2018 [employer] POST-ANESTHESIA  IMMEDIATELY FOLLOWING SURGERY:  Do not drive or operate machinery for the first twenty four hours after surgery.  Do not make any important decisions for twenty four hours after surgery or while taking narcotic pain medications or sedatives.  If you develop intractable nausea and vomiting or a severe headache please notify your doctor immediately.  FOLLOW-UP:  Please make an appointment with your surgeon as instructed. You do not need to follow up with anesthesia unless specifically instructed to do so.  WOUND CARE INSTRUCTIONS (if applicable):  Keep a dry clean dressing on the anesthesia/puncture wound site if there is drainage.  Once the wound has quit  draining you may leave it open to air.  Generally you should leave the bandage intact for twenty four hours unless there is drainage.  If the epidural site drains for more than 36-48 hours please call the anesthesia department.  QUESTIONS?:  Please feel free to call your physician or the hospital operator if you have any questions, and they will be happy to assist you.        Monitored Anesthesia Care, Care After These instructions provide you with information about caring for yourself after your procedure. Your health care provider may also give you more specific instructions. Your treatment has been planned according to current medical practices, but problems sometimes occur. Call your health care provider if you have any problems or questions after your procedure. What can I expect after the procedure? After your procedure, it is common to:  Feel sleepy for several hours.  Feel clumsy and have poor balance for several hours.  Feel forgetful about what happened after the procedure.  Have poor judgment for several hours.  Feel nauseous or vomit.  Have a sore throat if you had a breathing tube during the procedure.  Follow these instructions at home: For at least 24 hours after the procedure:   Do not: ? Participate in activities in which you could fall or become injured. ? Drive. ? Use heavy machinery. ? Drink alcohol. ? Take sleeping pills or medicines that cause drowsiness. ? Make important decisions or sign legal documents. ? Take care of children on your own.  Rest. Eating and drinking  Follow the diet that is recommended by your health care provider.  If you vomit, drink water, juice, or soup when you can drink without vomiting.  Make sure you have little or no nausea before eating solid foods. General instructions  Have a responsible adult stay with you until you are awake and alert.  Take over-the-counter and prescription medicines only as told by your health care provider.  If you smoke, do not smoke without supervision.  Keep all follow-up visits as told by your health care provider. This is important. Contact a health care provider if:  You keep feeling nauseous or you keep vomiting.  You feel light-headed.  You develop a rash.  You have a fever. Get help right away if:  You have trouble breathing. This  information is not intended to replace advice given to you by your health care provider. Make sure you discuss any questions you have with your health care provider. Document Released: 11/03/2015 Document Revised: 03/04/2016 Document Reviewed: 11/03/2015 Elsevier Interactive Patient Education  [employer].
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
## 2017-09-17 NOTE — Anesthesia Postprocedure Evaluation (Signed)
Anesthesia Post Note  Patient: Calvin Aguirre  Procedure(s) Performed: COLONOSCOPY WITH PROPOFOL (N/A ) BIOPSY  Patient location during evaluation: PACU Anesthesia Type: MAC Level of consciousness: awake and alert and patient cooperative Pain management: pain level controlled Vital Signs Assessment: post-procedure vital signs reviewed and stable Respiratory status: spontaneous breathing, nonlabored ventilation and respiratory function stable Cardiovascular status: blood pressure returned to baseline Postop Assessment: no apparent nausea or vomiting Anesthetic complications: no     Last Vitals:  Vitals:   09/17/17 0725 09/17/17 0726  BP: 109/72   Pulse:    Resp: 17 (!) 28  Temp:    SpO2: 96% 97%    Last Pain:  Vitals:   09/17/17 0714  TempSrc: Oral                 Augustina Braddock J

## 2017-09-17 NOTE — Transfer of Care (Signed)
Immediate Anesthesia Transfer of Care Note  Patient: Calvin Aguirre  Procedure(s) Performed: COLONOSCOPY WITH PROPOFOL (N/A ) BIOPSY  Patient Location: PACU  Anesthesia Type:MAC  Level of Consciousness: awake, alert  and patient cooperative  Airway & Oxygen Therapy: Patient Spontanous Breathing and Patient connected to face mask oxygen  Post-op Assessment: Report given to RN, Post -op Vital signs reviewed and stable and Patient moving all extremities  Post vital signs: Reviewed and stable  Last Vitals:  Vitals:   09/17/17 0725 09/17/17 0726  BP: 109/72   Pulse:    Resp: 17 (!) 28  Temp:    SpO2: 96% 97%    Last Pain:  Vitals:   09/17/17 0714  TempSrc: Oral      Patients Stated Pain Goal: 5 (40/97/35 3299)  Complications: No apparent anesthesia complications

## 2017-09-17 NOTE — Anesthesia Preprocedure Evaluation (Signed)
Anesthesia Evaluation  Patient identified by MRN, date of birth, ID band Patient awake    Reviewed: Allergy & Precautions, NPO status , Patient's Chart, lab work & pertinent test results  Airway Mallampati: I  TM Distance: >3 FB Neck ROM: Full    Dental  (+) Teeth Intact, Partial Upper   Pulmonary Current Smoker,    breath sounds clear to auscultation       Cardiovascular hypertension, Pt. on medications + Peripheral Vascular Disease ( Carotid artery disease - LICA occlusion )  + dysrhythmias  Rhythm:Regular Rate:Normal     Neuro/Psych  Headaches, PSYCHIATRIC DISORDERS Anxiety Depression Short term memory deficit. TIACVA, Residual Symptoms    GI/Hepatic GERD  Medicated and Controlled,  Endo/Other    Renal/GU Renal disease     Musculoskeletal   Abdominal   Peds  Hematology   Anesthesia Other Findings   Reproductive/Obstetrics                             Anesthesia Physical Anesthesia Plan  ASA: III  Anesthesia Plan: MAC   Post-op Pain Management:    Induction: Intravenous  PONV Risk Score and Plan:   Airway Management Planned: Simple Face Mask  Additional Equipment:   Intra-op Plan:   Post-operative Plan:   Informed Consent: I have reviewed the patients History and Physical, chart, labs and discussed the procedure including the risks, benefits and alternatives for the proposed anesthesia with the patient or authorized representative who has indicated his/her understanding and acceptance.     Plan Discussed with:   Anesthesia Plan Comments:         Anesthesia Quick Evaluation

## 2017-09-17 NOTE — Op Note (Signed)
[facility] Patient Name: Calvin Aguirre Procedure Date: 09/17/2017 7:13 AM MRN: [id number] Date of Birth: 05-23-1961 Attending MD: Hildred Laser , MD CSN: [id number] Age: 57 Admit Type: Outpatient Procedure:                Colonoscopy Indications:              Hematochezia Providers:                Hildred Laser, MD, Jeanann Lewandowsky. Sharon Seller, RN, Randa Spike, Technician Referring MD:             Sherley Bounds. Wendi Snipes, MD Medicines:                Propofol per Anesthesia Complications:            No immediate complications. Estimated Blood Loss:     Estimated blood loss was minimal. Procedure:                Pre-Anesthesia Assessment:                           - Prior to the procedure, a History and Physical                            was performed, and patient medications and                            allergies were reviewed. The patient's tolerance of                            previous anesthesia was also reviewed. The risks                            and benefits of the procedure and the sedation                            options and risks were discussed with the patient.                            All questions were answered, and informed consent                            was obtained. Prior Anticoagulants: The patient                            last took aspirin 1 day and ibuprofen 7 days prior                            to the procedure. After reviewing the risks and                            benefits, the patient was deemed in satisfactory  condition to undergo the procedure.                           After obtaining informed consent, the colonoscope                            was passed under direct vision. Throughout the                            procedure, the patient's blood pressure, pulse, and                            oxygen saturations were monitored continuously. The                            [id number]  ([id number]) scope was introduced through                            the and advanced to the the cecum, identified by                            appendiceal orifice and ileocecal valve. The                            colonoscopy was performed without difficulty. The                            patient tolerated the procedure well. The quality                            of the bowel preparation was unsatisfactory. The                            ileocecal valve, appendiceal orifice, and rectum                            were photographed. Scope In: 7:37:58 AM Scope Out: 7:49:02 AM Scope Withdrawal Time: 0 hours 7 minutes 59 seconds  Total Procedure Duration: 0 hours 11 minutes 4 seconds  Findings:      The perianal and digital rectal examinations were normal.      The sigmoid colon, descending colon, splenic flexure, transverse colon,       hepatic flexure, ascending colon, cecum, appendiceal orifice and       ileocecal valve appeared normal.      Discontinuous areas of bleeding ulcerated mucosa with stigmata of recent       bleeding were present in the recto-sigmoid colon. Biopsies were taken       with a cold forceps for histology.      Multiple angiodysplastic lesions without bleeding were found in the       distal rectum.      External hemorrhoids were found during retroflexion. The hemorrhoids       were medium-sized. Impression:               - Preparation of the colon was unsatisfactory.                           -  The sigmoid colon, descending colon, splenic                            flexure, transverse colon, hepatic flexure,                            ascending colon, cecum, appendiceal orifice and                            ileocecal valve are normal.                           - Focal colitis at rectosigmoid colon. Mucosal                            ulceration. Biopsied.                           - Multiple non-bleeding rectal angiodysplastic                             lesions.                           - External hemorrhoids.                           Comment: Hematochezia secondary to focal colitis of                            rectosigmoid junction.                           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 Moderate Sedation:      Per Anesthesia Care Recommendation:           - Patient has a contact number available for                            emergencies. The signs and symptoms of potential                            delayed complications were discussed with the                            patient. Return to normal activities tomorrow.                            Written discharge instructions were provided to the                            patient.                           - Resume previous diet today.                           -  Continue present medications.                           - Await pathology results.                           - Repeat colonoscopy is recommended. The                            colonoscopy date will be determined after pathology                            results from today's exam become available for                            review. Procedure Code(s):        --- Professional ---                           [phone], Colonoscopy, flexible; with biopsy, single                            or multiple Diagnosis Code(s):        --- Professional ---                           K64.4, Residual hemorrhoidal skin tags                           K63.3, Ulcer of intestine                           K55.20, Angiodysplasia of colon without hemorrhage                           K92.1, Melena (includes Hematochezia) CPT copyright 2016 American Medical Association. All rights reserved. The codes documented in this report are preliminary and upon coder review may  be revised to meet current compliance requirements. Hildred Laser, MD Hildred Laser, MD 09/17/2017 8:00:37 AM This report has  been signed electronically. Number of Addenda: 0

## 2017-09-20 ENCOUNTER — Encounter (HOSPITAL_COMMUNITY): Payer: Self-pay | Admitting: Internal Medicine

## 2017-09-20 LAB — POCT I-STAT 4, (NA,K, GLUC, HGB,HCT)
Glucose, Bld: 100 mg/dL — ABNORMAL HIGH (ref 65–99)
HEMATOCRIT: 38 % — AB (ref 39.0–52.0)
Hemoglobin: 12.9 g/dL — ABNORMAL LOW (ref 13.0–17.0)
POTASSIUM: 4.5 mmol/L (ref 3.5–5.1)
SODIUM: 141 mmol/L (ref 135–145)

## 2017-09-28 ENCOUNTER — Ambulatory Visit: Payer: Medicaid Other | Admitting: Family Medicine

## 2017-10-07 ENCOUNTER — Ambulatory Visit: Payer: Medicaid Other | Admitting: Family

## 2017-10-07 ENCOUNTER — Encounter (HOSPITAL_COMMUNITY): Payer: Medicaid Other

## 2017-10-18 ENCOUNTER — Other Ambulatory Visit: Payer: Self-pay | Admitting: Family Medicine

## 2017-10-19 ENCOUNTER — Other Ambulatory Visit: Payer: Self-pay

## 2017-10-19 NOTE — Telephone Encounter (Signed)
Last seen 07/13/17  Dr Wendi Snipes

## 2017-11-17 ENCOUNTER — Ambulatory Visit (INDEPENDENT_AMBULATORY_CARE_PROVIDER_SITE_OTHER): Payer: Medicaid Other | Admitting: Internal Medicine

## 2017-11-18 ENCOUNTER — Encounter (INDEPENDENT_AMBULATORY_CARE_PROVIDER_SITE_OTHER): Payer: Self-pay | Admitting: Internal Medicine

## 2017-11-23 ENCOUNTER — Other Ambulatory Visit: Payer: Self-pay | Admitting: Family Medicine

## 2017-11-23 NOTE — Telephone Encounter (Signed)
Last seen 07/13/17  Dr Wendi Snipes

## 2017-12-01 ENCOUNTER — Ambulatory Visit: Payer: Medicaid Other | Admitting: Cardiology

## 2017-12-02 ENCOUNTER — Ambulatory Visit: Payer: Medicaid Other | Admitting: Family Medicine

## 2017-12-02 ENCOUNTER — Encounter: Payer: Self-pay | Admitting: Family Medicine

## 2017-12-02 VITALS — BP 134/75 | HR 72 | Temp 98.0°F | Ht 75.0 in | Wt 251.4 lb

## 2017-12-02 DIAGNOSIS — L989 Disorder of the skin and subcutaneous tissue, unspecified: Secondary | ICD-10-CM

## 2017-12-02 DIAGNOSIS — E785 Hyperlipidemia, unspecified: Secondary | ICD-10-CM | POA: Diagnosis not present

## 2017-12-02 DIAGNOSIS — G4700 Insomnia, unspecified: Secondary | ICD-10-CM

## 2017-12-02 MED ORDER — ZOLPIDEM TARTRATE 10 MG PO TABS: 10.0000 mg | ORAL_TABLET | Freq: Every evening | ORAL | 5 refills | Status: DC | PRN

## 2017-12-02 NOTE — Progress Notes (Signed)
   HPI  Patient presents today for skin lesions, insomnia, and hyperlipidemia.  Hyperlipidemia Good medication compliance and tolerance.  Insomnia Doing okay with Ambien and trazodone, not as effective as they once were.  Skin lesion He has had multiple brown spots on his back developed over the last 6 months, he has a history of skin cancer and states that he has been instructed previously to have an annual skin check.  His previous dermatologist no longer takes his insurance. He needs and needs a new referral  PMH: Smoking status noted ROS: Per HPI  Objective: BP 134/75   Pulse 72   Temp 98 F (36.7 C) (Oral)   Ht _0  (1.905 m)   Wt 251 lb 6.4 oz (114 kg)   BMI 31.42 kg/m  Gen: NAD, alert, cooperative with exam HEENT: NCAT, EOMI, PERRL CV: RRR, good S1/S2, no murmur Resp: CTABL, no wheezes, non-labored Abd: SNTND, BS present, no guarding or organomegaly Ext: No edema, warm Neuro: Alert and oriented, No gross deficits  Skin 10-15 seborrheic keratosis of varying size on the back Right upper back shows 1 hyperpigmented asymmetric lesion measuring 4 mm x 5 mm Right mid back shows hyperkeratotic lesion 3 mm x 4 mm with small amount of surrounding erythema  Assessment and plan:  #Skin lesions Multiple lesions, history of skin cancer Refer to new dermatology  provider, likely needs biopsy  #Insomnia Refilled Ambien, continue trazodone as well   #Hyperlipidemia Repeat labs today, nonfasting Continue Lipitor     Orders Placed This Encounter  Procedures  . CMP14+EGFR  . CBC with Differential/Platelet  . Lipid panel  . Ambulatory referral to Dermatology    Referral Priority:   Routine    Referral Type:   Consultation    Referral Reason:   Specialty Services Required    Requested Specialty:   Dermatology    Number of Visits Requested:   1    Meds ordered this encounter  Medications  . zolpidem (AMBIEN) 10 MG tablet    Sig: Take 1 tablet (10 mg total)  by mouth at bedtime as needed. for sleep    Dispense:  30 tablet    Refill:  [place], MD [facility] 12/02/2017, 3:32 PM

## 2017-12-07 ENCOUNTER — Ambulatory Visit (INDEPENDENT_AMBULATORY_CARE_PROVIDER_SITE_OTHER): Payer: Medicaid Other | Admitting: Internal Medicine

## 2017-12-22 ENCOUNTER — Ambulatory Visit (INDEPENDENT_AMBULATORY_CARE_PROVIDER_SITE_OTHER): Payer: Medicaid Other | Admitting: Internal Medicine

## 2018-01-08 ENCOUNTER — Other Ambulatory Visit: Payer: Self-pay | Admitting: Family Medicine

## 2018-01-19 ENCOUNTER — Telehealth: Payer: Self-pay | Admitting: Family Medicine

## 2018-01-19 DIAGNOSIS — G8929 Other chronic pain: Secondary | ICD-10-CM

## 2018-01-19 DIAGNOSIS — M549 Dorsalgia, unspecified: Principal | ICD-10-CM

## 2018-01-20 NOTE — Telephone Encounter (Signed)
Lipan with pain clinic change, pt should be notified it will take 1-3 months  Laroy Apple, MD [facility] 01/20/2018, 7:35 AM

## 2018-02-03 DIAGNOSIS — Z7689 Persons encountering health services in other specified circumstances: Secondary | ICD-10-CM | POA: Diagnosis not present

## 2018-02-03 DIAGNOSIS — Z79899 Other long term (current) drug therapy: Secondary | ICD-10-CM | POA: Diagnosis not present

## 2018-02-15 DIAGNOSIS — Z7689 Persons encountering health services in other specified circumstances: Secondary | ICD-10-CM | POA: Diagnosis not present

## 2018-02-17 DIAGNOSIS — Z7689 Persons encountering health services in other specified circumstances: Secondary | ICD-10-CM | POA: Diagnosis not present

## 2018-02-18 DIAGNOSIS — L821 Other seborrheic keratosis: Secondary | ICD-10-CM | POA: Diagnosis not present

## 2018-02-18 DIAGNOSIS — L814 Other melanin hyperpigmentation: Secondary | ICD-10-CM | POA: Diagnosis not present

## 2018-02-18 DIAGNOSIS — D229 Melanocytic nevi, unspecified: Secondary | ICD-10-CM | POA: Diagnosis not present

## 2018-02-18 DIAGNOSIS — L708 Other acne: Secondary | ICD-10-CM | POA: Diagnosis not present

## 2018-02-18 DIAGNOSIS — Z7689 Persons encountering health services in other specified circumstances: Secondary | ICD-10-CM | POA: Diagnosis not present

## 2018-02-21 ENCOUNTER — Other Ambulatory Visit: Payer: Self-pay

## 2018-02-21 DIAGNOSIS — I6522 Occlusion and stenosis of left carotid artery: Secondary | ICD-10-CM

## 2018-02-21 DIAGNOSIS — I6521 Occlusion and stenosis of right carotid artery: Secondary | ICD-10-CM

## 2018-02-21 DIAGNOSIS — Z7689 Persons encountering health services in other specified circumstances: Secondary | ICD-10-CM | POA: Diagnosis not present

## 2018-02-22 ENCOUNTER — Other Ambulatory Visit: Payer: Self-pay | Admitting: Family Medicine

## 2018-02-22 NOTE — Telephone Encounter (Signed)
It does appear to be stopped on the May 9 visit by Dr. Wendi Snipes.  I recommend that he get seen by his new PCP.

## 2018-02-23 NOTE — Telephone Encounter (Signed)
No answer, couldn't leave a message.

## 2018-03-03 ENCOUNTER — Other Ambulatory Visit: Payer: Self-pay | Admitting: *Deleted

## 2018-03-03 NOTE — Progress Notes (Signed)
Opened in error

## 2018-03-07 ENCOUNTER — Other Ambulatory Visit: Payer: Self-pay | Admitting: Family Medicine

## 2018-03-07 DIAGNOSIS — I6522 Occlusion and stenosis of left carotid artery: Secondary | ICD-10-CM

## 2018-03-07 NOTE — Telephone Encounter (Signed)
Last seen 12/02/17  Dr Wendi Snipes

## 2018-03-21 ENCOUNTER — Ambulatory Visit: Payer: Medicaid Other | Admitting: Family

## 2018-03-21 ENCOUNTER — Encounter (HOSPITAL_COMMUNITY): Payer: Medicaid Other

## 2018-03-21 DIAGNOSIS — Z7689 Persons encountering health services in other specified circumstances: Secondary | ICD-10-CM | POA: Diagnosis not present

## 2018-03-21 DIAGNOSIS — Z79899 Other long term (current) drug therapy: Secondary | ICD-10-CM | POA: Diagnosis not present

## 2018-03-28 DIAGNOSIS — M2550 Pain in unspecified joint: Secondary | ICD-10-CM | POA: Diagnosis not present

## 2018-03-28 DIAGNOSIS — G8929 Other chronic pain: Secondary | ICD-10-CM | POA: Diagnosis not present

## 2018-03-28 DIAGNOSIS — G4700 Insomnia, unspecified: Secondary | ICD-10-CM | POA: Diagnosis not present

## 2018-03-28 DIAGNOSIS — I10 Essential (primary) hypertension: Secondary | ICD-10-CM | POA: Diagnosis not present

## 2018-03-29 DIAGNOSIS — I10 Essential (primary) hypertension: Secondary | ICD-10-CM | POA: Diagnosis not present

## 2018-03-29 DIAGNOSIS — G4700 Insomnia, unspecified: Secondary | ICD-10-CM | POA: Diagnosis not present

## 2018-03-29 DIAGNOSIS — M2550 Pain in unspecified joint: Secondary | ICD-10-CM | POA: Diagnosis not present

## 2018-03-29 DIAGNOSIS — G8929 Other chronic pain: Secondary | ICD-10-CM | POA: Diagnosis not present

## 2018-03-30 DIAGNOSIS — G4700 Insomnia, unspecified: Secondary | ICD-10-CM | POA: Diagnosis not present

## 2018-03-30 DIAGNOSIS — M2550 Pain in unspecified joint: Secondary | ICD-10-CM | POA: Diagnosis not present

## 2018-03-30 DIAGNOSIS — I10 Essential (primary) hypertension: Secondary | ICD-10-CM | POA: Diagnosis not present

## 2018-03-30 DIAGNOSIS — G8929 Other chronic pain: Secondary | ICD-10-CM | POA: Diagnosis not present

## 2018-03-31 DIAGNOSIS — G8929 Other chronic pain: Secondary | ICD-10-CM | POA: Diagnosis not present

## 2018-03-31 DIAGNOSIS — I10 Essential (primary) hypertension: Secondary | ICD-10-CM | POA: Diagnosis not present

## 2018-03-31 DIAGNOSIS — G4700 Insomnia, unspecified: Secondary | ICD-10-CM | POA: Diagnosis not present

## 2018-03-31 DIAGNOSIS — M2550 Pain in unspecified joint: Secondary | ICD-10-CM | POA: Diagnosis not present

## 2018-04-04 DIAGNOSIS — G8929 Other chronic pain: Secondary | ICD-10-CM | POA: Diagnosis not present

## 2018-04-04 DIAGNOSIS — G4700 Insomnia, unspecified: Secondary | ICD-10-CM | POA: Diagnosis not present

## 2018-04-04 DIAGNOSIS — M2550 Pain in unspecified joint: Secondary | ICD-10-CM | POA: Diagnosis not present

## 2018-04-04 DIAGNOSIS — I10 Essential (primary) hypertension: Secondary | ICD-10-CM | POA: Diagnosis not present

## 2018-04-05 DIAGNOSIS — I10 Essential (primary) hypertension: Secondary | ICD-10-CM | POA: Diagnosis not present

## 2018-04-05 DIAGNOSIS — G8929 Other chronic pain: Secondary | ICD-10-CM | POA: Diagnosis not present

## 2018-04-05 DIAGNOSIS — Z7689 Persons encountering health services in other specified circumstances: Secondary | ICD-10-CM | POA: Diagnosis not present

## 2018-04-05 DIAGNOSIS — M2550 Pain in unspecified joint: Secondary | ICD-10-CM | POA: Diagnosis not present

## 2018-04-05 DIAGNOSIS — G4700 Insomnia, unspecified: Secondary | ICD-10-CM | POA: Diagnosis not present

## 2018-04-06 DIAGNOSIS — M2550 Pain in unspecified joint: Secondary | ICD-10-CM | POA: Diagnosis not present

## 2018-04-06 DIAGNOSIS — G4700 Insomnia, unspecified: Secondary | ICD-10-CM | POA: Diagnosis not present

## 2018-04-06 DIAGNOSIS — I10 Essential (primary) hypertension: Secondary | ICD-10-CM | POA: Diagnosis not present

## 2018-04-06 DIAGNOSIS — G8929 Other chronic pain: Secondary | ICD-10-CM | POA: Diagnosis not present

## 2018-04-08 DIAGNOSIS — G8929 Other chronic pain: Secondary | ICD-10-CM | POA: Diagnosis not present

## 2018-04-08 DIAGNOSIS — G4700 Insomnia, unspecified: Secondary | ICD-10-CM | POA: Diagnosis not present

## 2018-04-08 DIAGNOSIS — I10 Essential (primary) hypertension: Secondary | ICD-10-CM | POA: Diagnosis not present

## 2018-04-08 DIAGNOSIS — M2550 Pain in unspecified joint: Secondary | ICD-10-CM | POA: Diagnosis not present

## 2018-04-11 DIAGNOSIS — I10 Essential (primary) hypertension: Secondary | ICD-10-CM | POA: Diagnosis not present

## 2018-04-11 DIAGNOSIS — M2550 Pain in unspecified joint: Secondary | ICD-10-CM | POA: Diagnosis not present

## 2018-04-11 DIAGNOSIS — G4700 Insomnia, unspecified: Secondary | ICD-10-CM | POA: Diagnosis not present

## 2018-04-11 DIAGNOSIS — G8929 Other chronic pain: Secondary | ICD-10-CM | POA: Diagnosis not present

## 2018-04-13 DIAGNOSIS — G4700 Insomnia, unspecified: Secondary | ICD-10-CM | POA: Diagnosis not present

## 2018-04-13 DIAGNOSIS — G8929 Other chronic pain: Secondary | ICD-10-CM | POA: Diagnosis not present

## 2018-04-13 DIAGNOSIS — M2550 Pain in unspecified joint: Secondary | ICD-10-CM | POA: Diagnosis not present

## 2018-04-13 DIAGNOSIS — I10 Essential (primary) hypertension: Secondary | ICD-10-CM | POA: Diagnosis not present

## 2018-04-14 DIAGNOSIS — M2550 Pain in unspecified joint: Secondary | ICD-10-CM | POA: Diagnosis not present

## 2018-04-14 DIAGNOSIS — I10 Essential (primary) hypertension: Secondary | ICD-10-CM | POA: Diagnosis not present

## 2018-04-14 DIAGNOSIS — G8929 Other chronic pain: Secondary | ICD-10-CM | POA: Diagnosis not present

## 2018-04-14 DIAGNOSIS — G4700 Insomnia, unspecified: Secondary | ICD-10-CM | POA: Diagnosis not present

## 2018-04-15 DIAGNOSIS — I10 Essential (primary) hypertension: Secondary | ICD-10-CM | POA: Diagnosis not present

## 2018-04-15 DIAGNOSIS — G8929 Other chronic pain: Secondary | ICD-10-CM | POA: Diagnosis not present

## 2018-04-15 DIAGNOSIS — M2550 Pain in unspecified joint: Secondary | ICD-10-CM | POA: Diagnosis not present

## 2018-04-15 DIAGNOSIS — G4700 Insomnia, unspecified: Secondary | ICD-10-CM | POA: Diagnosis not present

## 2018-04-18 DIAGNOSIS — M2550 Pain in unspecified joint: Secondary | ICD-10-CM | POA: Diagnosis not present

## 2018-04-18 DIAGNOSIS — I10 Essential (primary) hypertension: Secondary | ICD-10-CM | POA: Diagnosis not present

## 2018-04-18 DIAGNOSIS — G8929 Other chronic pain: Secondary | ICD-10-CM | POA: Diagnosis not present

## 2018-04-18 DIAGNOSIS — G4700 Insomnia, unspecified: Secondary | ICD-10-CM | POA: Diagnosis not present

## 2018-04-19 ENCOUNTER — Ambulatory Visit: Payer: Medicaid Other | Admitting: Family

## 2018-04-19 ENCOUNTER — Encounter (HOSPITAL_COMMUNITY): Payer: Medicaid Other

## 2018-04-19 DIAGNOSIS — G8929 Other chronic pain: Secondary | ICD-10-CM | POA: Diagnosis not present

## 2018-04-19 DIAGNOSIS — G4700 Insomnia, unspecified: Secondary | ICD-10-CM | POA: Diagnosis not present

## 2018-04-19 DIAGNOSIS — I10 Essential (primary) hypertension: Secondary | ICD-10-CM | POA: Diagnosis not present

## 2018-04-19 DIAGNOSIS — M2550 Pain in unspecified joint: Secondary | ICD-10-CM | POA: Diagnosis not present

## 2018-04-20 DIAGNOSIS — M2550 Pain in unspecified joint: Secondary | ICD-10-CM | POA: Diagnosis not present

## 2018-04-20 DIAGNOSIS — Z79899 Other long term (current) drug therapy: Secondary | ICD-10-CM | POA: Diagnosis not present

## 2018-04-20 DIAGNOSIS — M5136 Other intervertebral disc degeneration, lumbar region: Secondary | ICD-10-CM | POA: Diagnosis not present

## 2018-04-20 DIAGNOSIS — G4700 Insomnia, unspecified: Secondary | ICD-10-CM | POA: Diagnosis not present

## 2018-04-20 DIAGNOSIS — Z7689 Persons encountering health services in other specified circumstances: Secondary | ICD-10-CM | POA: Diagnosis not present

## 2018-04-20 DIAGNOSIS — G894 Chronic pain syndrome: Secondary | ICD-10-CM | POA: Diagnosis not present

## 2018-04-20 DIAGNOSIS — G8929 Other chronic pain: Secondary | ICD-10-CM | POA: Diagnosis not present

## 2018-04-20 DIAGNOSIS — I10 Essential (primary) hypertension: Secondary | ICD-10-CM | POA: Diagnosis not present

## 2018-04-21 DIAGNOSIS — G8929 Other chronic pain: Secondary | ICD-10-CM | POA: Diagnosis not present

## 2018-04-21 DIAGNOSIS — G4700 Insomnia, unspecified: Secondary | ICD-10-CM | POA: Diagnosis not present

## 2018-04-21 DIAGNOSIS — M2550 Pain in unspecified joint: Secondary | ICD-10-CM | POA: Diagnosis not present

## 2018-04-21 DIAGNOSIS — I10 Essential (primary) hypertension: Secondary | ICD-10-CM | POA: Diagnosis not present

## 2018-04-25 ENCOUNTER — Encounter: Payer: Self-pay | Admitting: Family Medicine

## 2018-04-25 DIAGNOSIS — G4700 Insomnia, unspecified: Secondary | ICD-10-CM | POA: Diagnosis not present

## 2018-04-25 DIAGNOSIS — C61 Malignant neoplasm of prostate: Secondary | ICD-10-CM | POA: Diagnosis not present

## 2018-04-25 DIAGNOSIS — G8929 Other chronic pain: Secondary | ICD-10-CM | POA: Diagnosis not present

## 2018-04-25 DIAGNOSIS — N393 Stress incontinence (female) (male): Secondary | ICD-10-CM | POA: Diagnosis not present

## 2018-04-25 DIAGNOSIS — M2550 Pain in unspecified joint: Secondary | ICD-10-CM | POA: Diagnosis not present

## 2018-04-25 DIAGNOSIS — C775 Secondary and unspecified malignant neoplasm of intrapelvic lymph nodes: Secondary | ICD-10-CM | POA: Diagnosis not present

## 2018-04-25 DIAGNOSIS — I10 Essential (primary) hypertension: Secondary | ICD-10-CM | POA: Diagnosis not present

## 2018-04-25 DIAGNOSIS — Z7689 Persons encountering health services in other specified circumstances: Secondary | ICD-10-CM | POA: Diagnosis not present

## 2018-04-26 DIAGNOSIS — G8929 Other chronic pain: Secondary | ICD-10-CM | POA: Diagnosis not present

## 2018-04-26 DIAGNOSIS — M2550 Pain in unspecified joint: Secondary | ICD-10-CM | POA: Diagnosis not present

## 2018-04-26 DIAGNOSIS — G4700 Insomnia, unspecified: Secondary | ICD-10-CM | POA: Diagnosis not present

## 2018-04-26 DIAGNOSIS — I10 Essential (primary) hypertension: Secondary | ICD-10-CM | POA: Diagnosis not present

## 2018-04-27 ENCOUNTER — Other Ambulatory Visit: Payer: Self-pay | Admitting: Family Medicine

## 2018-04-27 DIAGNOSIS — M2550 Pain in unspecified joint: Secondary | ICD-10-CM | POA: Diagnosis not present

## 2018-04-27 DIAGNOSIS — G4700 Insomnia, unspecified: Secondary | ICD-10-CM | POA: Diagnosis not present

## 2018-04-27 DIAGNOSIS — G8929 Other chronic pain: Secondary | ICD-10-CM | POA: Diagnosis not present

## 2018-04-27 DIAGNOSIS — I10 Essential (primary) hypertension: Secondary | ICD-10-CM | POA: Diagnosis not present

## 2018-04-28 DIAGNOSIS — M2550 Pain in unspecified joint: Secondary | ICD-10-CM | POA: Diagnosis not present

## 2018-04-28 DIAGNOSIS — I10 Essential (primary) hypertension: Secondary | ICD-10-CM | POA: Diagnosis not present

## 2018-04-28 DIAGNOSIS — G4700 Insomnia, unspecified: Secondary | ICD-10-CM | POA: Diagnosis not present

## 2018-04-28 DIAGNOSIS — G8929 Other chronic pain: Secondary | ICD-10-CM | POA: Diagnosis not present

## 2018-04-28 NOTE — Telephone Encounter (Signed)
OV 05/09/18

## 2018-05-03 ENCOUNTER — Other Ambulatory Visit: Payer: Self-pay

## 2018-05-03 ENCOUNTER — Ambulatory Visit (INDEPENDENT_AMBULATORY_CARE_PROVIDER_SITE_OTHER): Payer: Medicaid Other | Admitting: Family

## 2018-05-03 ENCOUNTER — Ambulatory Visit (HOSPITAL_COMMUNITY)
Admission: RE | Admit: 2018-05-03 | Discharge: 2018-05-03 | Disposition: A | Discharge status: Home / Self Care | Payer: Medicaid Other | Source: Ambulatory Visit | Attending: Vascular Surgery | Admitting: Vascular Surgery

## 2018-05-03 ENCOUNTER — Encounter: Payer: Self-pay | Admitting: Family

## 2018-05-03 VITALS — BP 150/87 | HR 55 | Temp 98.3°F | Resp 18 | Ht 75.0 in | Wt 245.1 lb

## 2018-05-03 DIAGNOSIS — I6521 Occlusion and stenosis of right carotid artery: Secondary | ICD-10-CM | POA: Diagnosis not present

## 2018-05-03 DIAGNOSIS — F17200 Nicotine dependence, unspecified, uncomplicated: Secondary | ICD-10-CM | POA: Diagnosis not present

## 2018-05-03 DIAGNOSIS — Z7689 Persons encountering health services in other specified circumstances: Secondary | ICD-10-CM | POA: Diagnosis not present

## 2018-05-03 DIAGNOSIS — B353 Tinea pedis: Secondary | ICD-10-CM | POA: Diagnosis not present

## 2018-05-03 DIAGNOSIS — I6522 Occlusion and stenosis of left carotid artery: Secondary | ICD-10-CM

## 2018-05-03 NOTE — Patient Instructions (Signed)
Steps to Quit Smoking Smoking tobacco can be bad for your health. It can also affect almost every organ in your body. Smoking puts you and people around you at risk for many serious long-lasting (chronic) diseases. Quitting smoking is hard, but it is one of the best things that you can do for your health. It is never too late to quit. What are the benefits of quitting smoking? When you quit smoking, you lower your risk for getting serious diseases and conditions. They can include:  Lung cancer or lung disease.  Heart disease.  Stroke.  Heart attack.  Not being able to have children (infertility).  Weak bones (osteoporosis) and broken bones (fractures).  If you have coughing, wheezing, and shortness of breath, those symptoms may get better when you quit. You may also get sick less often. If you are pregnant, quitting smoking can help to lower your chances of having a baby of low birth weight. What can I do to help me quit smoking? Talk with your doctor about what can help you quit smoking. Some things you can do (strategies) include:  Quitting smoking totally, instead of slowly cutting back how much you smoke over a period of time.  Going to in-person counseling. You are more likely to quit if you go to many counseling sessions.  Using resources and support systems, such as: ? Online chats with a counselor. ? Phone quitlines. ? Printed self-help materials. ? Support groups or group counseling. ? Text messaging programs. ? Mobile phone apps or applications.  Taking medicines. Some of these medicines may have nicotine in them. If you are pregnant or breastfeeding, do not take any medicines to quit smoking unless your doctor says it is okay. Talk with your doctor about counseling or other things that can help you.  Talk with your doctor about using more than one strategy at the same time, such as taking medicines while you are also going to in-person counseling. This can help make  quitting easier. What things can I do to make it easier to quit? Quitting smoking might feel very hard at first, but there is a lot that you can do to make it easier. Take these steps:  Talk to your family and friends. Ask them to support and encourage you.  Call phone quitlines, reach out to support groups, or work with a counselor.  Ask people who smoke to not smoke around you.  Avoid places that make you want (trigger) to smoke, such as: ? Bars. ? Parties. ? Smoke-break areas at work.  Spend time with people who do not smoke.  Lower the stress in your life. Stress can make you want to smoke. Try these things to help your stress: ? Getting regular exercise. ? Deep-breathing exercises. ? Yoga. ? Meditating. ? Doing a body scan. To do this, close your eyes, focus on one area of your body at a time from head to toe, and notice which parts of your body are tense. Try to relax the muscles in those areas.  Download or buy apps on your mobile phone or tablet that can help you stick to your quit plan. There are many free apps, such as QuitGuide from the CDC (Centers for Disease Control and Prevention). You can find more support from smokefree.gov and other websites.  This information is not intended to replace advice given to you by your health care provider. Make sure you discuss any questions you have with your health care provider. Document Released: 05/09/2009 Document   Revised: 03/10/2016 Document Reviewed: 11/27/2014 Elsevier Interactive Patient Education  2018 [employer] Inc.     Stroke Prevention Some health problems and behaviors may make it more likely for you to have a stroke. Below are ways to lessen your risk of having a stroke.  Be active for at least 30 minutes on most or all days.  Do not smoke. Try not to be around others who smoke.  Do not drink too much alcohol. ? Do not have more than 2 drinks a day if you are a man. ? Do not have more than 1 drink a day if you  are a woman and are not pregnant.  Eat healthy foods, such as fruits and vegetables. If you were put on a specific diet, follow the diet as told.  Keep your cholesterol levels under control through diet and medicines. Look for foods that are low in saturated fat, trans fat, cholesterol, and are high in fiber.  If you have diabetes, follow all diet plans and take your medicine as told.  Ask your doctor if you need treatment to lower your blood pressure. If you have high blood pressure (hypertension), follow all diet plans and take your medicine as told by your doctor.  If you are 18-39 years old, have your blood pressure checked every 3-5 years. If you are age 40 or older, have your blood pressure checked every year.  Keep a healthy weight. Eat foods that are low in calories, salt, saturated fat, trans fat, and cholesterol.  Do not take drugs.  Avoid birth control pills, if this applies. Talk to your doctor about the risks of taking birth control pills.  Talk to your doctor if you have sleep problems (sleep apnea).  Take all medicine as told by your doctor. ? You may be told to take aspirin or blood thinner medicine. Take this medicine as told by your doctor. ? Understand your medicine instructions.  Make sure any other conditions you have are being taken care of.  Get help right away if:  You suddenly lose feeling (you feel numb) or have weakness in your face, arm, or leg.  Your face or eyelid hangs down to one side.  You suddenly feel confused.  You have trouble talking (aphasia) or understanding what people are saying.  You suddenly have trouble seeing in one or both eyes.  You suddenly have trouble walking.  You are dizzy.  You lose your balance or your movements are clumsy (uncoordinated).  You suddenly have a very bad headache and you do not know the cause.  You have new chest pain.  Your heart feels like it is fluttering or skipping a beat (irregular  heartbeat). Do not wait to see if the symptoms above go away. Get help right away. Call your local emergency services (911 in [place] Do not drive yourself to the hospital. This information is not intended to replace advice given to you by your health care provider. Make sure you discuss any questions you have with your health care provider. Document Released: 01/12/2012 Document Revised: 12/19/2015 Document Reviewed: 01/13/2013 Elsevier Interactive Patient Education  2018 Elsevier Inc.  
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
## 2018-05-05 ENCOUNTER — Telehealth: Payer: Self-pay | Admitting: *Deleted

## 2018-05-05 NOTE — Telephone Encounter (Signed)
Pt says he stopped taking Eliquis 3 days after starting it in December of last year and started taking only aspirin 81 mg daily - says he had a stroke 2 weeks ago and did not seek medical treatment (saying he knows what a stroke feels like didn't need to go to the hospital for that) requesting to see Dr Domenic Polite (appt scheduled for 10/15) says he started back taking the Eliquis 5 mg bid yesterday and wanted to know if he should continue this and stop taking aspirin

## 2018-05-05 NOTE — Telephone Encounter (Signed)
Pt aware and voiced understanding - encouraged him to keep his appt next week

## 2018-05-05 NOTE — Telephone Encounter (Signed)
I reviewed the chart.  I have not seen him in a year and a half.  At that time he was on Eliquis 5 mg twice daily.  He has had interval lab work per PCP which I reviewed.  If he goes back on Eliquis 5 mg twice daily now, would have him stop aspirin.  Needs to keep office visit to discuss further.

## 2018-05-09 ENCOUNTER — Ambulatory Visit: Payer: Medicaid Other | Admitting: Family Medicine

## 2018-05-09 ENCOUNTER — Encounter: Payer: Self-pay | Admitting: Cardiology
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
## 2018-05-10 ENCOUNTER — Ambulatory Visit: Payer: Medicaid Other | Admitting: Cardiology

## 2018-05-12 ENCOUNTER — Telehealth: Payer: Self-pay | Admitting: *Deleted

## 2018-05-12 MED ORDER — APIXABAN 5 MG PO TABS: 5.0000 mg | ORAL_TABLET | Freq: Two times a day (BID) | ORAL | 0 refills | Status: DC

## 2018-05-12 NOTE — Telephone Encounter (Signed)
Patient is requesting refill on eliquis 5 mg. Per last phone note, and Dr. Domenic Polite, patient can restart eliquis at 5 mg BID and is to stop aspirin. Patient informed and verbalized understanding and is aware to keep upcoming appt for 06/21/18. Rx sent to [facility].

## 2018-05-18 DIAGNOSIS — Z79899 Other long term (current) drug therapy: Secondary | ICD-10-CM | POA: Diagnosis not present

## 2018-05-19 DIAGNOSIS — G4700 Insomnia, unspecified: Secondary | ICD-10-CM | POA: Diagnosis not present

## 2018-05-19 DIAGNOSIS — I10 Essential (primary) hypertension: Secondary | ICD-10-CM | POA: Diagnosis not present

## 2018-05-19 DIAGNOSIS — G8929 Other chronic pain: Secondary | ICD-10-CM | POA: Diagnosis not present

## 2018-05-20 DIAGNOSIS — G8929 Other chronic pain: Secondary | ICD-10-CM | POA: Diagnosis not present

## 2018-05-20 DIAGNOSIS — I10 Essential (primary) hypertension: Secondary | ICD-10-CM | POA: Diagnosis not present

## 2018-05-20 DIAGNOSIS — G4700 Insomnia, unspecified: Secondary | ICD-10-CM | POA: Diagnosis not present

## 2018-05-23 DIAGNOSIS — G4700 Insomnia, unspecified: Secondary | ICD-10-CM | POA: Diagnosis not present

## 2018-05-23 DIAGNOSIS — G8929 Other chronic pain: Secondary | ICD-10-CM | POA: Diagnosis not present

## 2018-05-23 DIAGNOSIS — I10 Essential (primary) hypertension: Secondary | ICD-10-CM | POA: Diagnosis not present

## 2018-05-25 DIAGNOSIS — G4700 Insomnia, unspecified: Secondary | ICD-10-CM | POA: Diagnosis not present

## 2018-05-25 DIAGNOSIS — I10 Essential (primary) hypertension: Secondary | ICD-10-CM | POA: Diagnosis not present

## 2018-05-25 DIAGNOSIS — G8929 Other chronic pain: Secondary | ICD-10-CM | POA: Diagnosis not present

## 2018-05-26 DIAGNOSIS — G4700 Insomnia, unspecified: Secondary | ICD-10-CM | POA: Diagnosis not present

## 2018-05-26 DIAGNOSIS — G8929 Other chronic pain: Secondary | ICD-10-CM | POA: Diagnosis not present

## 2018-05-26 DIAGNOSIS — I10 Essential (primary) hypertension: Secondary | ICD-10-CM | POA: Diagnosis not present

## 2018-05-27 ENCOUNTER — Telehealth: Payer: Self-pay

## 2018-05-27 NOTE — Telephone Encounter (Signed)
Pharmacy sending in Ibuprofen 800 1 3 times daily  Not on med list

## 2018-05-27 NOTE — Telephone Encounter (Signed)
Deny it, thanks

## 2018-05-27 NOTE — Telephone Encounter (Signed)
Pt is on eliquis. He should not regularly be taking high dose ibuprofen due to increased risk of bleeding.

## 2018-05-30 DIAGNOSIS — G8929 Other chronic pain: Secondary | ICD-10-CM | POA: Diagnosis not present

## 2018-05-30 DIAGNOSIS — G4700 Insomnia, unspecified: Secondary | ICD-10-CM | POA: Diagnosis not present

## 2018-05-30 DIAGNOSIS — I10 Essential (primary) hypertension: Secondary | ICD-10-CM | POA: Diagnosis not present

## 2018-05-31 ENCOUNTER — Encounter: Payer: Self-pay | Admitting: Family Medicine

## 2018-05-31 ENCOUNTER — Ambulatory Visit: Payer: Medicaid Other | Admitting: Family Medicine

## 2018-05-31 VITALS — BP 120/69 | HR 63 | Temp 98.4°F | Ht 75.0 in | Wt 246.2 lb

## 2018-05-31 DIAGNOSIS — G459 Transient cerebral ischemic attack, unspecified: Secondary | ICD-10-CM | POA: Diagnosis not present

## 2018-05-31 DIAGNOSIS — E785 Hyperlipidemia, unspecified: Secondary | ICD-10-CM | POA: Diagnosis not present

## 2018-05-31 DIAGNOSIS — I10 Essential (primary) hypertension: Secondary | ICD-10-CM

## 2018-05-31 DIAGNOSIS — G3281 Cerebellar ataxia in diseases classified elsewhere: Secondary | ICD-10-CM

## 2018-05-31 DIAGNOSIS — I6522 Occlusion and stenosis of left carotid artery: Secondary | ICD-10-CM | POA: Diagnosis not present

## 2018-05-31 MED ORDER — OMEPRAZOLE 20 MG PO CPDR: 20.0000 mg | DELAYED_RELEASE_CAPSULE | Freq: Every day | ORAL | 1 refills | Status: DC

## 2018-05-31 MED ORDER — DULOXETINE HCL 20 MG PO CPEP: 20.0000 mg | ORAL_CAPSULE | Freq: Every day | ORAL | 1 refills | Status: DC

## 2018-05-31 MED ORDER — TRAZODONE HCL 150 MG PO TABS: 300.0000 mg | ORAL_TABLET | Freq: Every day | ORAL | 1 refills | Status: DC

## 2018-05-31 MED ORDER — ALBUTEROL SULFATE HFA 108 (90 BASE) MCG/ACT IN AERS: 2.0000 | INHALATION_SPRAY | RESPIRATORY_TRACT | 11 refills | Status: DC | PRN

## 2018-05-31 MED ORDER — METOPROLOL SUCCINATE ER 50 MG PO TB24: ORAL_TABLET | ORAL | 1 refills | Status: DC

## 2018-05-31 MED ORDER — HYDROCORTISONE 1 % EX CREA: 1.0000 "application " | TOPICAL_CREAM | Freq: Every day | CUTANEOUS | 2 refills | Status: DC | PRN

## 2018-05-31 MED ORDER — ATORVASTATIN CALCIUM 40 MG PO TABS: 40.0000 mg | ORAL_TABLET | Freq: Every day | ORAL | 1 refills | Status: DC

## 2018-05-31 MED ORDER — ZOLPIDEM TARTRATE 10 MG PO TABS: 10.0000 mg | ORAL_TABLET | Freq: Every evening | ORAL | 5 refills | Status: DC | PRN

## 2018-05-31 MED ORDER — CYCLOBENZAPRINE HCL 10 MG PO TABS: 10.0000 mg | ORAL_TABLET | Freq: Three times a day (TID) | ORAL | 2 refills | Status: DC
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
## 2018-06-01 DIAGNOSIS — G4700 Insomnia, unspecified: Secondary | ICD-10-CM | POA: Diagnosis not present

## 2018-06-01 DIAGNOSIS — G8929 Other chronic pain: Secondary | ICD-10-CM | POA: Diagnosis not present

## 2018-06-01 DIAGNOSIS — I10 Essential (primary) hypertension: Secondary | ICD-10-CM | POA: Diagnosis not present

## 2018-06-01 LAB — CBC WITH DIFFERENTIAL/PLATELET
BASOS ABS: 0.1 10*3/uL (ref 0.0–0.2)
Basos: 1 %
EOS (ABSOLUTE): 0.3 10*3/uL (ref 0.0–0.4)
Eos: 6 %
HEMOGLOBIN: 13.7 g/dL (ref 13.0–17.7)
Hematocrit: 39.1 % (ref 37.5–51.0)
IMMATURE GRANULOCYTES: 0 %
Immature Grans (Abs): 0 10*3/uL (ref 0.0–0.1)
LYMPHS: 24 %
Lymphocytes Absolute: 1.4 10*3/uL (ref 0.7–3.1)
MCH: 32.4 pg (ref 26.6–33.0)
MCHC: 35 g/dL (ref 31.5–35.7)
MCV: 92 fL (ref 79–97)
MONOCYTES: 9 %
Monocytes Absolute: 0.5 10*3/uL (ref 0.1–0.9)
NEUTROS ABS: 3.3 10*3/uL (ref 1.4–7.0)
NEUTROS PCT: 60 %
Platelets: 185 10*3/uL (ref 150–450)
RBC: 4.23 x10E6/uL (ref 4.14–5.80)
RDW: 13.2 % (ref 12.3–15.4)
WBC: 5.6 10*3/uL (ref 3.4–10.8)

## 2018-06-01 LAB — CMP14+EGFR
ALBUMIN: 3.2 g/dL — AB (ref 3.5–5.5)
ALT: 10 IU/L (ref 0–44)
AST: 12 IU/L (ref 0–40)
Albumin/Globulin Ratio: 1.2 (ref 1.2–2.2)
Alkaline Phosphatase: 56 IU/L (ref 39–117)
BUN/Creatinine Ratio: 5 — ABNORMAL LOW (ref 9–20)
BUN: 6 mg/dL (ref 6–24)
Bilirubin Total: 0.3 mg/dL (ref 0.0–1.2)
CALCIUM: 8.8 mg/dL (ref 8.7–10.2)
CHLORIDE: 102 mmol/L (ref 96–106)
CO2: 25 mmol/L (ref 20–29)
CREATININE: 1.11 mg/dL (ref 0.76–1.27)
GFR calc Af Amer: 84 mL/min/{1.73_m2} (ref >59)
GFR, EST NON AFRICAN AMERICAN: 73 mL/min/{1.73_m2} (ref >59)
Globulin, Total: 2.7 g/dL (ref 1.5–4.5)
Glucose: 121 mg/dL — ABNORMAL HIGH (ref 65–99)
Potassium: 3.8 mmol/L (ref 3.5–5.2)
Sodium: 140 mmol/L (ref 134–144)
TOTAL PROTEIN: 5.9 g/dL — AB (ref 6.0–8.5)

## 2018-06-01 LAB — LIPID PANEL
CHOL/HDL RATIO: 4.8 ratio (ref 0.0–5.0)
Cholesterol, Total: 116 mg/dL (ref 100–199)
HDL: 24 mg/dL — AB (ref >39)
LDL CALC: 48 mg/dL (ref 0–99)
Triglycerides: 220 mg/dL — ABNORMAL HIGH (ref 0–149)
VLDL CHOLESTEROL CAL: 44 mg/dL — AB (ref 5–40)

## 2018-06-01 NOTE — Progress Notes (Signed)
Hello Rajat,  Your lab result is normal.Some minor variations that are not significant are commonly marked abnormal, but do not represent any medical problem for you.  Best regards, Charmel Pronovost, M.D.

## 2018-06-02 DIAGNOSIS — G4700 Insomnia, unspecified: Secondary | ICD-10-CM | POA: Diagnosis not present

## 2018-06-02 DIAGNOSIS — I10 Essential (primary) hypertension: Secondary | ICD-10-CM | POA: Diagnosis not present

## 2018-06-02 DIAGNOSIS — G8929 Other chronic pain: Secondary | ICD-10-CM | POA: Diagnosis not present

## 2018-06-03 DIAGNOSIS — I10 Essential (primary) hypertension: Secondary | ICD-10-CM | POA: Diagnosis not present

## 2018-06-03 DIAGNOSIS — G4700 Insomnia, unspecified: Secondary | ICD-10-CM | POA: Diagnosis not present

## 2018-06-03 DIAGNOSIS — G8929 Other chronic pain: Secondary | ICD-10-CM | POA: Diagnosis not present

## 2018-06-06 DIAGNOSIS — G8929 Other chronic pain: Secondary | ICD-10-CM | POA: Diagnosis not present

## 2018-06-06 DIAGNOSIS — G4700 Insomnia, unspecified: Secondary | ICD-10-CM | POA: Diagnosis not present

## 2018-06-06 DIAGNOSIS — I10 Essential (primary) hypertension: Secondary | ICD-10-CM | POA: Diagnosis not present

## 2018-06-07 ENCOUNTER — Ambulatory Visit (HOSPITAL_COMMUNITY): Payer: Medicaid Other

## 2018-06-07 DIAGNOSIS — I10 Essential (primary) hypertension: Secondary | ICD-10-CM | POA: Diagnosis not present

## 2018-06-07 DIAGNOSIS — G4700 Insomnia, unspecified: Secondary | ICD-10-CM | POA: Diagnosis not present

## 2018-06-07 DIAGNOSIS — G8929 Other chronic pain: Secondary | ICD-10-CM | POA: Diagnosis not present

## 2018-06-08 DIAGNOSIS — G8929 Other chronic pain: Secondary | ICD-10-CM | POA: Diagnosis not present

## 2018-06-08 DIAGNOSIS — G4700 Insomnia, unspecified: Secondary | ICD-10-CM | POA: Diagnosis not present

## 2018-06-08 DIAGNOSIS — I10 Essential (primary) hypertension: Secondary | ICD-10-CM | POA: Diagnosis not present

## 2018-06-13 DIAGNOSIS — G8929 Other chronic pain: Secondary | ICD-10-CM | POA: Diagnosis not present

## 2018-06-13 DIAGNOSIS — I10 Essential (primary) hypertension: Secondary | ICD-10-CM | POA: Diagnosis not present

## 2018-06-13 DIAGNOSIS — G4700 Insomnia, unspecified: Secondary | ICD-10-CM | POA: Diagnosis not present

## 2018-06-14 DIAGNOSIS — I10 Essential (primary) hypertension: Secondary | ICD-10-CM | POA: Diagnosis not present

## 2018-06-14 DIAGNOSIS — G4700 Insomnia, unspecified: Secondary | ICD-10-CM | POA: Diagnosis not present

## 2018-06-14 DIAGNOSIS — G8929 Other chronic pain: Secondary | ICD-10-CM | POA: Diagnosis not present

## 2018-06-15 DIAGNOSIS — G8929 Other chronic pain: Secondary | ICD-10-CM | POA: Diagnosis not present

## 2018-06-15 DIAGNOSIS — G4700 Insomnia, unspecified: Secondary | ICD-10-CM | POA: Diagnosis not present

## 2018-06-15 DIAGNOSIS — I10 Essential (primary) hypertension: Secondary | ICD-10-CM | POA: Diagnosis not present

## 2018-06-16 DIAGNOSIS — G4700 Insomnia, unspecified: Secondary | ICD-10-CM | POA: Diagnosis not present

## 2018-06-16 DIAGNOSIS — I10 Essential (primary) hypertension: Secondary | ICD-10-CM | POA: Diagnosis not present

## 2018-06-16 DIAGNOSIS — G8929 Other chronic pain: Secondary | ICD-10-CM | POA: Diagnosis not present

## 2018-06-17 DIAGNOSIS — G8929 Other chronic pain: Secondary | ICD-10-CM | POA: Diagnosis not present

## 2018-06-17 DIAGNOSIS — Z79899 Other long term (current) drug therapy: Secondary | ICD-10-CM | POA: Diagnosis not present

## 2018-06-17 DIAGNOSIS — G4700 Insomnia, unspecified: Secondary | ICD-10-CM | POA: Diagnosis not present

## 2018-06-17 DIAGNOSIS — I10 Essential (primary) hypertension: Secondary | ICD-10-CM | POA: Diagnosis not present

## 2018-06-20 DIAGNOSIS — G8929 Other chronic pain: Secondary | ICD-10-CM | POA: Diagnosis not present

## 2018-06-20 DIAGNOSIS — G4700 Insomnia, unspecified: Secondary | ICD-10-CM | POA: Diagnosis not present

## 2018-06-20 DIAGNOSIS — I10 Essential (primary) hypertension: Secondary | ICD-10-CM | POA: Diagnosis not present

## 2018-06-21 ENCOUNTER — Ambulatory Visit: Payer: Medicaid Other | Admitting: Cardiology

## 2018-06-21 DIAGNOSIS — G8929 Other chronic pain: Secondary | ICD-10-CM | POA: Diagnosis not present

## 2018-06-21 DIAGNOSIS — I10 Essential (primary) hypertension: Secondary | ICD-10-CM | POA: Diagnosis not present

## 2018-06-21 DIAGNOSIS — G4700 Insomnia, unspecified: Secondary | ICD-10-CM | POA: Diagnosis not present

## 2018-06-22 DIAGNOSIS — I10 Essential (primary) hypertension: Secondary | ICD-10-CM | POA: Diagnosis not present

## 2018-06-22 DIAGNOSIS — G8929 Other chronic pain: Secondary | ICD-10-CM | POA: Diagnosis not present

## 2018-06-22 DIAGNOSIS — G4700 Insomnia, unspecified: Secondary | ICD-10-CM | POA: Diagnosis not present

## 2018-06-23 DIAGNOSIS — G4700 Insomnia, unspecified: Secondary | ICD-10-CM | POA: Diagnosis not present

## 2018-06-23 DIAGNOSIS — I10 Essential (primary) hypertension: Secondary | ICD-10-CM | POA: Diagnosis not present

## 2018-06-23 DIAGNOSIS — G8929 Other chronic pain: Secondary | ICD-10-CM | POA: Diagnosis not present

## 2018-06-24 DIAGNOSIS — I10 Essential (primary) hypertension: Secondary | ICD-10-CM | POA: Diagnosis not present

## 2018-06-24 DIAGNOSIS — G4700 Insomnia, unspecified: Secondary | ICD-10-CM | POA: Diagnosis not present

## 2018-06-24 DIAGNOSIS — G8929 Other chronic pain: Secondary | ICD-10-CM | POA: Diagnosis not present

## 2018-06-27 DIAGNOSIS — I10 Essential (primary) hypertension: Secondary | ICD-10-CM | POA: Diagnosis not present

## 2018-06-27 DIAGNOSIS — G4700 Insomnia, unspecified: Secondary | ICD-10-CM | POA: Diagnosis not present

## 2018-06-27 DIAGNOSIS — G8929 Other chronic pain: Secondary | ICD-10-CM | POA: Diagnosis not present

## 2018-06-29 ENCOUNTER — Ambulatory Visit (HOSPITAL_COMMUNITY): Payer: Medicaid Other

## 2018-06-29 DIAGNOSIS — I10 Essential (primary) hypertension: Secondary | ICD-10-CM | POA: Diagnosis not present

## 2018-06-29 DIAGNOSIS — G8929 Other chronic pain: Secondary | ICD-10-CM | POA: Diagnosis not present

## 2018-06-29 DIAGNOSIS — G4700 Insomnia, unspecified: Secondary | ICD-10-CM | POA: Diagnosis not present

## 2018-06-30 DIAGNOSIS — G8929 Other chronic pain: Secondary | ICD-10-CM | POA: Diagnosis not present

## 2018-06-30 DIAGNOSIS — I10 Essential (primary) hypertension: Secondary | ICD-10-CM | POA: Diagnosis not present

## 2018-06-30 DIAGNOSIS — G4700 Insomnia, unspecified: Secondary | ICD-10-CM | POA: Diagnosis not present

## 2018-07-01 DIAGNOSIS — G4700 Insomnia, unspecified: Secondary | ICD-10-CM | POA: Diagnosis not present

## 2018-07-01 DIAGNOSIS — G8929 Other chronic pain: Secondary | ICD-10-CM | POA: Diagnosis not present

## 2018-07-01 DIAGNOSIS — I10 Essential (primary) hypertension: Secondary | ICD-10-CM | POA: Diagnosis not present

## 2018-07-04 DIAGNOSIS — N393 Stress incontinence (female) (male): Secondary | ICD-10-CM | POA: Diagnosis not present

## 2018-07-04 DIAGNOSIS — Z7689 Persons encountering health services in other specified circumstances: Secondary | ICD-10-CM | POA: Diagnosis not present

## 2018-07-04 DIAGNOSIS — E230 Hypopituitarism: Secondary | ICD-10-CM | POA: Diagnosis not present

## 2018-07-04 DIAGNOSIS — C775 Secondary and unspecified malignant neoplasm of intrapelvic lymph nodes: Secondary | ICD-10-CM | POA: Diagnosis not present

## 2018-07-04 DIAGNOSIS — C61 Malignant neoplasm of prostate: Secondary | ICD-10-CM | POA: Diagnosis not present

## 2018-07-05 DIAGNOSIS — G8929 Other chronic pain: Secondary | ICD-10-CM | POA: Diagnosis not present

## 2018-07-05 DIAGNOSIS — I10 Essential (primary) hypertension: Secondary | ICD-10-CM | POA: Diagnosis not present

## 2018-07-05 DIAGNOSIS — G4700 Insomnia, unspecified: Secondary | ICD-10-CM | POA: Diagnosis not present

## 2018-07-06 DIAGNOSIS — G4700 Insomnia, unspecified: Secondary | ICD-10-CM | POA: Diagnosis not present

## 2018-07-06 DIAGNOSIS — G8929 Other chronic pain: Secondary | ICD-10-CM | POA: Diagnosis not present

## 2018-07-06 DIAGNOSIS — I10 Essential (primary) hypertension: Secondary | ICD-10-CM | POA: Diagnosis not present

## 2018-07-07 DIAGNOSIS — I10 Essential (primary) hypertension: Secondary | ICD-10-CM | POA: Diagnosis not present

## 2018-07-07 DIAGNOSIS — G4700 Insomnia, unspecified: Secondary | ICD-10-CM | POA: Diagnosis not present

## 2018-07-07 DIAGNOSIS — G8929 Other chronic pain: Secondary | ICD-10-CM | POA: Diagnosis not present

## 2018-07-08 DIAGNOSIS — I10 Essential (primary) hypertension: Secondary | ICD-10-CM | POA: Diagnosis not present

## 2018-07-08 DIAGNOSIS — G8929 Other chronic pain: Secondary | ICD-10-CM | POA: Diagnosis not present

## 2018-07-08 DIAGNOSIS — G4700 Insomnia, unspecified: Secondary | ICD-10-CM | POA: Diagnosis not present

## 2018-07-11 DIAGNOSIS — G4700 Insomnia, unspecified: Secondary | ICD-10-CM | POA: Diagnosis not present

## 2018-07-11 DIAGNOSIS — G8929 Other chronic pain: Secondary | ICD-10-CM | POA: Diagnosis not present

## 2018-07-11 DIAGNOSIS — I10 Essential (primary) hypertension: Secondary | ICD-10-CM | POA: Diagnosis not present

## 2018-07-13 DIAGNOSIS — G8929 Other chronic pain: Secondary | ICD-10-CM | POA: Diagnosis not present

## 2018-07-13 DIAGNOSIS — G4700 Insomnia, unspecified: Secondary | ICD-10-CM | POA: Diagnosis not present

## 2018-07-13 DIAGNOSIS — I10 Essential (primary) hypertension: Secondary | ICD-10-CM | POA: Diagnosis not present

## 2018-07-14 DIAGNOSIS — I10 Essential (primary) hypertension: Secondary | ICD-10-CM | POA: Diagnosis not present

## 2018-07-14 DIAGNOSIS — G4700 Insomnia, unspecified: Secondary | ICD-10-CM | POA: Diagnosis not present

## 2018-07-14 DIAGNOSIS — G8929 Other chronic pain: Secondary | ICD-10-CM | POA: Diagnosis not present

## 2018-07-15 DIAGNOSIS — G4700 Insomnia, unspecified: Secondary | ICD-10-CM | POA: Diagnosis not present

## 2018-07-15 DIAGNOSIS — I10 Essential (primary) hypertension: Secondary | ICD-10-CM | POA: Diagnosis not present

## 2018-07-15 DIAGNOSIS — G8929 Other chronic pain: Secondary | ICD-10-CM | POA: Diagnosis not present

## 2018-07-18 DIAGNOSIS — G8929 Other chronic pain: Secondary | ICD-10-CM | POA: Diagnosis not present

## 2018-07-18 DIAGNOSIS — G4700 Insomnia, unspecified: Secondary | ICD-10-CM | POA: Diagnosis not present

## 2018-07-18 DIAGNOSIS — I10 Essential (primary) hypertension: Secondary | ICD-10-CM | POA: Diagnosis not present

## 2018-07-19 DIAGNOSIS — G4700 Insomnia, unspecified: Secondary | ICD-10-CM | POA: Diagnosis not present

## 2018-07-19 DIAGNOSIS — G8929 Other chronic pain: Secondary | ICD-10-CM | POA: Diagnosis not present

## 2018-07-19 DIAGNOSIS — I10 Essential (primary) hypertension: Secondary | ICD-10-CM | POA: Diagnosis not present

## 2018-07-21 DIAGNOSIS — G8929 Other chronic pain: Secondary | ICD-10-CM | POA: Diagnosis not present

## 2018-07-21 DIAGNOSIS — Z79899 Other long term (current) drug therapy: Secondary | ICD-10-CM | POA: Diagnosis not present

## 2018-07-21 DIAGNOSIS — M5136 Other intervertebral disc degeneration, lumbar region: Secondary | ICD-10-CM | POA: Diagnosis not present

## 2018-07-21 DIAGNOSIS — G4700 Insomnia, unspecified: Secondary | ICD-10-CM | POA: Diagnosis not present

## 2018-07-21 DIAGNOSIS — I10 Essential (primary) hypertension: Secondary | ICD-10-CM | POA: Diagnosis not present

## 2018-07-21 DIAGNOSIS — G894 Chronic pain syndrome: Secondary | ICD-10-CM | POA: Diagnosis not present

## 2018-07-22 DIAGNOSIS — G8929 Other chronic pain: Secondary | ICD-10-CM | POA: Diagnosis not present

## 2018-07-22 DIAGNOSIS — I10 Essential (primary) hypertension: Secondary | ICD-10-CM | POA: Diagnosis not present

## 2018-07-22 DIAGNOSIS — G4700 Insomnia, unspecified: Secondary | ICD-10-CM | POA: Diagnosis not present

## 2018-07-25 DIAGNOSIS — G8929 Other chronic pain: Secondary | ICD-10-CM | POA: Diagnosis not present

## 2018-07-25 DIAGNOSIS — I10 Essential (primary) hypertension: Secondary | ICD-10-CM | POA: Diagnosis not present

## 2018-07-25 DIAGNOSIS — G4700 Insomnia, unspecified: Secondary | ICD-10-CM | POA: Diagnosis not present

## 2018-07-26 DIAGNOSIS — G8929 Other chronic pain: Secondary | ICD-10-CM | POA: Diagnosis not present

## 2018-07-26 DIAGNOSIS — I10 Essential (primary) hypertension: Secondary | ICD-10-CM | POA: Diagnosis not present

## 2018-07-26 DIAGNOSIS — G4700 Insomnia, unspecified: Secondary | ICD-10-CM | POA: Diagnosis not present

## 2018-08-01 ENCOUNTER — Encounter: Payer: Self-pay | Admitting: Cardiology

## 2018-08-01 ENCOUNTER — Ambulatory Visit (INDEPENDENT_AMBULATORY_CARE_PROVIDER_SITE_OTHER): Payer: Medicaid Other | Admitting: Cardiology

## 2018-08-01 VITALS — BP 148/82 | HR 60 | Ht 75.0 in | Wt 246.0 lb

## 2018-08-01 DIAGNOSIS — I10 Essential (primary) hypertension: Secondary | ICD-10-CM

## 2018-08-01 DIAGNOSIS — Z720 Tobacco use: Secondary | ICD-10-CM

## 2018-08-01 DIAGNOSIS — I480 Paroxysmal atrial fibrillation: Secondary | ICD-10-CM

## 2018-08-01 DIAGNOSIS — I6522 Occlusion and stenosis of left carotid artery: Secondary | ICD-10-CM | POA: Diagnosis not present
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
## 2018-08-01 NOTE — Patient Instructions (Signed)

## 2018-08-03 DIAGNOSIS — G8929 Other chronic pain: Secondary | ICD-10-CM | POA: Diagnosis not present

## 2018-08-03 DIAGNOSIS — I10 Essential (primary) hypertension: Secondary | ICD-10-CM | POA: Diagnosis not present

## 2018-08-03 DIAGNOSIS — G4700 Insomnia, unspecified: Secondary | ICD-10-CM | POA: Diagnosis not present

## 2018-08-04 DIAGNOSIS — Z7689 Persons encountering health services in other specified circumstances: Secondary | ICD-10-CM | POA: Diagnosis not present

## 2018-08-05 DIAGNOSIS — G8929 Other chronic pain: Secondary | ICD-10-CM | POA: Diagnosis not present

## 2018-08-05 DIAGNOSIS — G4700 Insomnia, unspecified: Secondary | ICD-10-CM | POA: Diagnosis not present

## 2018-08-05 DIAGNOSIS — I10 Essential (primary) hypertension: Secondary | ICD-10-CM | POA: Diagnosis not present

## 2018-08-08 DIAGNOSIS — I10 Essential (primary) hypertension: Secondary | ICD-10-CM | POA: Diagnosis not present

## 2018-08-08 DIAGNOSIS — G4700 Insomnia, unspecified: Secondary | ICD-10-CM | POA: Diagnosis not present

## 2018-08-08 DIAGNOSIS — G8929 Other chronic pain: Secondary | ICD-10-CM | POA: Diagnosis not present

## 2018-08-09 DIAGNOSIS — G8929 Other chronic pain: Secondary | ICD-10-CM | POA: Diagnosis not present

## 2018-08-09 DIAGNOSIS — G4700 Insomnia, unspecified: Secondary | ICD-10-CM | POA: Diagnosis not present

## 2018-08-09 DIAGNOSIS — I10 Essential (primary) hypertension: Secondary | ICD-10-CM | POA: Diagnosis not present

## 2018-08-10 DIAGNOSIS — I10 Essential (primary) hypertension: Secondary | ICD-10-CM | POA: Diagnosis not present

## 2018-08-10 DIAGNOSIS — G4700 Insomnia, unspecified: Secondary | ICD-10-CM | POA: Diagnosis not present

## 2018-08-10 DIAGNOSIS — G8929 Other chronic pain: Secondary | ICD-10-CM | POA: Diagnosis not present

## 2018-08-11 DIAGNOSIS — G4700 Insomnia, unspecified: Secondary | ICD-10-CM | POA: Diagnosis not present

## 2018-08-11 DIAGNOSIS — G8929 Other chronic pain: Secondary | ICD-10-CM | POA: Diagnosis not present

## 2018-08-11 DIAGNOSIS — I10 Essential (primary) hypertension: Secondary | ICD-10-CM | POA: Diagnosis not present

## 2018-08-17 DIAGNOSIS — Z79899 Other long term (current) drug therapy: Secondary | ICD-10-CM | POA: Diagnosis not present

## 2018-08-17 DIAGNOSIS — Z7689 Persons encountering health services in other specified circumstances: Secondary | ICD-10-CM | POA: Diagnosis not present

## 2018-08-18 DIAGNOSIS — G8929 Other chronic pain: Secondary | ICD-10-CM | POA: Diagnosis not present

## 2018-08-18 DIAGNOSIS — G4700 Insomnia, unspecified: Secondary | ICD-10-CM | POA: Diagnosis not present

## 2018-08-18 DIAGNOSIS — I10 Essential (primary) hypertension: Secondary | ICD-10-CM | POA: Diagnosis not present

## 2018-08-20 DIAGNOSIS — G4700 Insomnia, unspecified: Secondary | ICD-10-CM | POA: Diagnosis not present

## 2018-08-20 DIAGNOSIS — I10 Essential (primary) hypertension: Secondary | ICD-10-CM | POA: Diagnosis not present

## 2018-08-20 DIAGNOSIS — G8929 Other chronic pain: Secondary | ICD-10-CM | POA: Diagnosis not present

## 2018-08-21 ENCOUNTER — Other Ambulatory Visit: Payer: Self-pay | Admitting: Cardiology

## 2018-08-22 DIAGNOSIS — G8929 Other chronic pain: Secondary | ICD-10-CM | POA: Diagnosis not present

## 2018-08-22 DIAGNOSIS — G4700 Insomnia, unspecified: Secondary | ICD-10-CM | POA: Diagnosis not present

## 2018-08-22 DIAGNOSIS — I10 Essential (primary) hypertension: Secondary | ICD-10-CM | POA: Diagnosis not present

## 2018-08-23 DIAGNOSIS — G4700 Insomnia, unspecified: Secondary | ICD-10-CM | POA: Diagnosis not present

## 2018-08-23 DIAGNOSIS — I10 Essential (primary) hypertension: Secondary | ICD-10-CM | POA: Diagnosis not present

## 2018-08-23 DIAGNOSIS — G8929 Other chronic pain: Secondary | ICD-10-CM | POA: Diagnosis not present

## 2018-08-24 DIAGNOSIS — G4700 Insomnia, unspecified: Secondary | ICD-10-CM | POA: Diagnosis not present

## 2018-08-24 DIAGNOSIS — I10 Essential (primary) hypertension: Secondary | ICD-10-CM | POA: Diagnosis not present

## 2018-08-24 DIAGNOSIS — G8929 Other chronic pain: Secondary | ICD-10-CM | POA: Diagnosis not present

## 2018-08-25 DIAGNOSIS — G4700 Insomnia, unspecified: Secondary | ICD-10-CM | POA: Diagnosis not present

## 2018-08-25 DIAGNOSIS — G8929 Other chronic pain: Secondary | ICD-10-CM | POA: Diagnosis not present

## 2018-08-25 DIAGNOSIS — I10 Essential (primary) hypertension: Secondary | ICD-10-CM | POA: Diagnosis not present

## 2018-08-27 DIAGNOSIS — G4700 Insomnia, unspecified: Secondary | ICD-10-CM | POA: Diagnosis not present

## 2018-08-27 DIAGNOSIS — I10 Essential (primary) hypertension: Secondary | ICD-10-CM | POA: Diagnosis not present

## 2018-08-27 DIAGNOSIS — G8929 Other chronic pain: Secondary | ICD-10-CM | POA: Diagnosis not present

## 2018-08-29 DIAGNOSIS — I10 Essential (primary) hypertension: Secondary | ICD-10-CM | POA: Diagnosis not present

## 2018-08-29 DIAGNOSIS — Z7689 Persons encountering health services in other specified circumstances: Secondary | ICD-10-CM | POA: Diagnosis not present

## 2018-08-29 DIAGNOSIS — G8929 Other chronic pain: Secondary | ICD-10-CM | POA: Diagnosis not present

## 2018-08-29 DIAGNOSIS — G4700 Insomnia, unspecified: Secondary | ICD-10-CM | POA: Diagnosis not present

## 2018-08-30 DIAGNOSIS — G4700 Insomnia, unspecified: Secondary | ICD-10-CM | POA: Diagnosis not present

## 2018-08-30 DIAGNOSIS — I10 Essential (primary) hypertension: Secondary | ICD-10-CM | POA: Diagnosis not present

## 2018-08-30 DIAGNOSIS — G8929 Other chronic pain: Secondary | ICD-10-CM | POA: Diagnosis not present

## 2018-08-31 DIAGNOSIS — I10 Essential (primary) hypertension: Secondary | ICD-10-CM | POA: Diagnosis not present

## 2018-08-31 DIAGNOSIS — G4700 Insomnia, unspecified: Secondary | ICD-10-CM | POA: Diagnosis not present

## 2018-08-31 DIAGNOSIS — G8929 Other chronic pain: Secondary | ICD-10-CM | POA: Diagnosis not present

## 2018-09-05 DIAGNOSIS — G4700 Insomnia, unspecified: Secondary | ICD-10-CM | POA: Diagnosis not present

## 2018-09-05 DIAGNOSIS — G8929 Other chronic pain: Secondary | ICD-10-CM | POA: Diagnosis not present

## 2018-09-05 DIAGNOSIS — I10 Essential (primary) hypertension: Secondary | ICD-10-CM | POA: Diagnosis not present

## 2018-09-06 DIAGNOSIS — I10 Essential (primary) hypertension: Secondary | ICD-10-CM | POA: Diagnosis not present

## 2018-09-06 DIAGNOSIS — G4700 Insomnia, unspecified: Secondary | ICD-10-CM | POA: Diagnosis not present

## 2018-09-06 DIAGNOSIS — G8929 Other chronic pain: Secondary | ICD-10-CM | POA: Diagnosis not present

## 2018-09-07 DIAGNOSIS — G4700 Insomnia, unspecified: Secondary | ICD-10-CM | POA: Diagnosis not present

## 2018-09-07 DIAGNOSIS — G8929 Other chronic pain: Secondary | ICD-10-CM | POA: Diagnosis not present

## 2018-09-07 DIAGNOSIS — I10 Essential (primary) hypertension: Secondary | ICD-10-CM | POA: Diagnosis not present

## 2018-09-09 DIAGNOSIS — G4700 Insomnia, unspecified: Secondary | ICD-10-CM | POA: Diagnosis not present

## 2018-09-09 DIAGNOSIS — G8929 Other chronic pain: Secondary | ICD-10-CM | POA: Diagnosis not present

## 2018-09-09 DIAGNOSIS — I10 Essential (primary) hypertension: Secondary | ICD-10-CM | POA: Diagnosis not present

## 2018-09-12 DIAGNOSIS — G8929 Other chronic pain: Secondary | ICD-10-CM | POA: Diagnosis not present

## 2018-09-12 DIAGNOSIS — I10 Essential (primary) hypertension: Secondary | ICD-10-CM | POA: Diagnosis not present

## 2018-09-12 DIAGNOSIS — G4700 Insomnia, unspecified: Secondary | ICD-10-CM | POA: Diagnosis not present

## 2018-09-13 DIAGNOSIS — G4700 Insomnia, unspecified: Secondary | ICD-10-CM | POA: Diagnosis not present

## 2018-09-13 DIAGNOSIS — I10 Essential (primary) hypertension: Secondary | ICD-10-CM | POA: Diagnosis not present

## 2018-09-13 DIAGNOSIS — G8929 Other chronic pain: Secondary | ICD-10-CM | POA: Diagnosis not present

## 2018-09-14 DIAGNOSIS — G4700 Insomnia, unspecified: Secondary | ICD-10-CM | POA: Diagnosis not present

## 2018-09-14 DIAGNOSIS — I10 Essential (primary) hypertension: Secondary | ICD-10-CM | POA: Diagnosis not present

## 2018-09-14 DIAGNOSIS — G8929 Other chronic pain: Secondary | ICD-10-CM | POA: Diagnosis not present

## 2018-09-16 DIAGNOSIS — Z79899 Other long term (current) drug therapy: Secondary | ICD-10-CM | POA: Diagnosis not present

## 2018-09-16 DIAGNOSIS — Z7689 Persons encountering health services in other specified circumstances: Secondary | ICD-10-CM | POA: Diagnosis not present

## 2018-09-19 DIAGNOSIS — G8929 Other chronic pain: Secondary | ICD-10-CM | POA: Diagnosis not present

## 2018-09-19 DIAGNOSIS — I10 Essential (primary) hypertension: Secondary | ICD-10-CM | POA: Diagnosis not present

## 2018-09-19 DIAGNOSIS — G4700 Insomnia, unspecified: Secondary | ICD-10-CM | POA: Diagnosis not present

## 2018-09-20 DIAGNOSIS — I10 Essential (primary) hypertension: Secondary | ICD-10-CM | POA: Diagnosis not present

## 2018-09-20 DIAGNOSIS — G8929 Other chronic pain: Secondary | ICD-10-CM | POA: Diagnosis not present

## 2018-09-20 DIAGNOSIS — G4700 Insomnia, unspecified: Secondary | ICD-10-CM | POA: Diagnosis not present

## 2018-09-21 DIAGNOSIS — Z7689 Persons encountering health services in other specified circumstances: Secondary | ICD-10-CM | POA: Diagnosis not present

## 2018-09-22 ENCOUNTER — Other Ambulatory Visit (HOSPITAL_COMMUNITY): Payer: Self-pay | Admitting: Nurse Practitioner

## 2018-09-22 ENCOUNTER — Other Ambulatory Visit: Payer: Self-pay | Admitting: Nurse Practitioner

## 2018-09-22 DIAGNOSIS — G4700 Insomnia, unspecified: Secondary | ICD-10-CM | POA: Diagnosis not present

## 2018-09-22 DIAGNOSIS — M5136 Other intervertebral disc degeneration, lumbar region: Secondary | ICD-10-CM

## 2018-09-22 DIAGNOSIS — I10 Essential (primary) hypertension: Secondary | ICD-10-CM | POA: Diagnosis not present

## 2018-09-22 DIAGNOSIS — G8929 Other chronic pain: Secondary | ICD-10-CM | POA: Diagnosis not present

## 2018-09-22 DIAGNOSIS — M51369 Other intervertebral disc degeneration, lumbar region without mention of lumbar back pain or lower extremity pain: Secondary | ICD-10-CM

## 2018-09-23 DIAGNOSIS — I10 Essential (primary) hypertension: Secondary | ICD-10-CM | POA: Diagnosis not present

## 2018-09-23 DIAGNOSIS — G4700 Insomnia, unspecified: Secondary | ICD-10-CM | POA: Diagnosis not present

## 2018-09-23 DIAGNOSIS — G8929 Other chronic pain: Secondary | ICD-10-CM | POA: Diagnosis not present

## 2018-09-29 DIAGNOSIS — G8929 Other chronic pain: Secondary | ICD-10-CM | POA: Diagnosis not present

## 2018-09-29 DIAGNOSIS — I10 Essential (primary) hypertension: Secondary | ICD-10-CM | POA: Diagnosis not present

## 2018-09-29 DIAGNOSIS — G4700 Insomnia, unspecified: Secondary | ICD-10-CM | POA: Diagnosis not present

## 2018-09-30 ENCOUNTER — Ambulatory Visit (HOSPITAL_COMMUNITY): Payer: Medicaid Other

## 2018-09-30 DIAGNOSIS — G4700 Insomnia, unspecified: Secondary | ICD-10-CM | POA: Diagnosis not present

## 2018-09-30 DIAGNOSIS — G8929 Other chronic pain: Secondary | ICD-10-CM | POA: Diagnosis not present

## 2018-09-30 DIAGNOSIS — I10 Essential (primary) hypertension: Secondary | ICD-10-CM | POA: Diagnosis not present

## 2018-10-03 ENCOUNTER — Encounter (HOSPITAL_COMMUNITY): Payer: Self-pay

## 2018-10-03 ENCOUNTER — Ambulatory Visit (HOSPITAL_COMMUNITY): Admission: RE | Admit: 2018-10-03 | Payer: Medicaid Other | Source: Ambulatory Visit

## 2018-10-04 DIAGNOSIS — G4700 Insomnia, unspecified: Secondary | ICD-10-CM | POA: Diagnosis not present

## 2018-10-04 DIAGNOSIS — G8929 Other chronic pain: Secondary | ICD-10-CM | POA: Diagnosis not present

## 2018-10-04 DIAGNOSIS — I10 Essential (primary) hypertension: Secondary | ICD-10-CM | POA: Diagnosis not present

## 2018-10-10 ENCOUNTER — Ambulatory Visit (HOSPITAL_COMMUNITY): Payer: Medicaid Other

## 2018-10-11 DIAGNOSIS — I10 Essential (primary) hypertension: Secondary | ICD-10-CM | POA: Diagnosis not present

## 2018-10-11 DIAGNOSIS — G4700 Insomnia, unspecified: Secondary | ICD-10-CM | POA: Diagnosis not present

## 2018-10-11 DIAGNOSIS — G8929 Other chronic pain: Secondary | ICD-10-CM | POA: Diagnosis not present

## 2018-10-12 ENCOUNTER — Ambulatory Visit (HOSPITAL_COMMUNITY): Payer: Medicaid Other

## 2018-10-13 ENCOUNTER — Other Ambulatory Visit: Payer: Self-pay | Admitting: Family Medicine

## 2018-10-17 DIAGNOSIS — Z79891 Long term (current) use of opiate analgesic: Secondary | ICD-10-CM | POA: Diagnosis not present

## 2018-10-17 DIAGNOSIS — Z79899 Other long term (current) drug therapy: Secondary | ICD-10-CM | POA: Diagnosis not present

## 2018-10-17 DIAGNOSIS — G894 Chronic pain syndrome: Secondary | ICD-10-CM | POA: Diagnosis not present

## 2018-10-17 DIAGNOSIS — M5136 Other intervertebral disc degeneration, lumbar region: Secondary | ICD-10-CM | POA: Diagnosis not present

## 2018-10-18 DIAGNOSIS — G8929 Other chronic pain: Secondary | ICD-10-CM | POA: Diagnosis not present

## 2018-10-18 DIAGNOSIS — I10 Essential (primary) hypertension: Secondary | ICD-10-CM | POA: Diagnosis not present

## 2018-10-18 DIAGNOSIS — G4700 Insomnia, unspecified: Secondary | ICD-10-CM | POA: Diagnosis not present

## 2018-10-19 DIAGNOSIS — G4700 Insomnia, unspecified: Secondary | ICD-10-CM | POA: Diagnosis not present

## 2018-10-19 DIAGNOSIS — G8929 Other chronic pain: Secondary | ICD-10-CM | POA: Diagnosis not present

## 2018-10-19 DIAGNOSIS — I10 Essential (primary) hypertension: Secondary | ICD-10-CM | POA: Diagnosis not present

## 2018-10-20 DIAGNOSIS — G8929 Other chronic pain: Secondary | ICD-10-CM | POA: Diagnosis not present

## 2018-10-20 DIAGNOSIS — I10 Essential (primary) hypertension: Secondary | ICD-10-CM | POA: Diagnosis not present

## 2018-10-20 DIAGNOSIS — G4700 Insomnia, unspecified: Secondary | ICD-10-CM | POA: Diagnosis not present

## 2018-10-21 DIAGNOSIS — I10 Essential (primary) hypertension: Secondary | ICD-10-CM | POA: Diagnosis not present

## 2018-10-21 DIAGNOSIS — G4700 Insomnia, unspecified: Secondary | ICD-10-CM | POA: Diagnosis not present

## 2018-10-21 DIAGNOSIS — G8929 Other chronic pain: Secondary | ICD-10-CM | POA: Diagnosis not present

## 2018-10-25 DIAGNOSIS — G8929 Other chronic pain: Secondary | ICD-10-CM | POA: Diagnosis not present

## 2018-10-25 DIAGNOSIS — G4700 Insomnia, unspecified: Secondary | ICD-10-CM | POA: Diagnosis not present

## 2018-10-25 DIAGNOSIS — I10 Essential (primary) hypertension: Secondary | ICD-10-CM | POA: Diagnosis not present

## 2018-10-26 DIAGNOSIS — I10 Essential (primary) hypertension: Secondary | ICD-10-CM | POA: Diagnosis not present

## 2018-10-26 DIAGNOSIS — G4700 Insomnia, unspecified: Secondary | ICD-10-CM | POA: Diagnosis not present

## 2018-10-26 DIAGNOSIS — G8929 Other chronic pain: Secondary | ICD-10-CM | POA: Diagnosis not present

## 2018-10-29 DIAGNOSIS — G4700 Insomnia, unspecified: Secondary | ICD-10-CM | POA: Diagnosis not present

## 2018-10-29 DIAGNOSIS — G8929 Other chronic pain: Secondary | ICD-10-CM | POA: Diagnosis not present

## 2018-10-29 DIAGNOSIS — I10 Essential (primary) hypertension: Secondary | ICD-10-CM | POA: Diagnosis not present

## 2018-11-01 DIAGNOSIS — I10 Essential (primary) hypertension: Secondary | ICD-10-CM | POA: Diagnosis not present

## 2018-11-02 DIAGNOSIS — G4700 Insomnia, unspecified: Secondary | ICD-10-CM | POA: Diagnosis not present

## 2018-11-02 DIAGNOSIS — G8929 Other chronic pain: Secondary | ICD-10-CM | POA: Diagnosis not present

## 2018-11-02 DIAGNOSIS — I10 Essential (primary) hypertension: Secondary | ICD-10-CM | POA: Diagnosis not present

## 2018-11-07 DIAGNOSIS — G4700 Insomnia, unspecified: Secondary | ICD-10-CM | POA: Diagnosis not present

## 2018-11-07 DIAGNOSIS — G8929 Other chronic pain: Secondary | ICD-10-CM | POA: Diagnosis not present

## 2018-11-07 DIAGNOSIS — I10 Essential (primary) hypertension: Secondary | ICD-10-CM | POA: Diagnosis not present

## 2018-11-09 ENCOUNTER — Emergency Department (HOSPITAL_COMMUNITY)
Admission: EM | Admit: 2018-11-09 | Discharge: 2018-11-09 | Disposition: A | Discharge status: Home / Self Care | Payer: No Typology Code available for payment source | Attending: Emergency Medicine | Admitting: Emergency Medicine

## 2018-11-09 ENCOUNTER — Encounter (HOSPITAL_COMMUNITY): Payer: Self-pay

## 2018-11-09 ENCOUNTER — Emergency Department (HOSPITAL_COMMUNITY): Payer: No Typology Code available for payment source

## 2018-11-09 ENCOUNTER — Other Ambulatory Visit: Payer: Self-pay

## 2018-11-09 DIAGNOSIS — Z8673 Personal history of transient ischemic attack (TIA), and cerebral infarction without residual deficits: Secondary | ICD-10-CM | POA: Insufficient documentation

## 2018-11-09 DIAGNOSIS — S32010A Wedge compression fracture of first lumbar vertebra, initial encounter for closed fracture: Secondary | ICD-10-CM | POA: Diagnosis not present

## 2018-11-09 DIAGNOSIS — I10 Essential (primary) hypertension: Secondary | ICD-10-CM | POA: Diagnosis not present

## 2018-11-09 DIAGNOSIS — F17210 Nicotine dependence, cigarettes, uncomplicated: Secondary | ICD-10-CM | POA: Insufficient documentation

## 2018-11-09 DIAGNOSIS — Y999 Unspecified external cause status: Secondary | ICD-10-CM | POA: Diagnosis not present

## 2018-11-09 DIAGNOSIS — Z7901 Long term (current) use of anticoagulants: Secondary | ICD-10-CM | POA: Diagnosis not present

## 2018-11-09 DIAGNOSIS — S3992XA Unspecified injury of lower back, initial encounter: Secondary | ICD-10-CM | POA: Diagnosis present

## 2018-11-09 DIAGNOSIS — S20211A Contusion of right front wall of thorax, initial encounter: Secondary | ICD-10-CM | POA: Insufficient documentation

## 2018-11-09 DIAGNOSIS — Y939 Activity, unspecified: Secondary | ICD-10-CM | POA: Insufficient documentation

## 2018-11-09 DIAGNOSIS — R0789 Other chest pain: Secondary | ICD-10-CM | POA: Diagnosis not present

## 2018-11-09 DIAGNOSIS — I2510 Atherosclerotic heart disease of native coronary artery without angina pectoris: Secondary | ICD-10-CM | POA: Diagnosis not present

## 2018-11-09 DIAGNOSIS — G8929 Other chronic pain: Secondary | ICD-10-CM | POA: Diagnosis not present

## 2018-11-09 DIAGNOSIS — G4700 Insomnia, unspecified: Secondary | ICD-10-CM | POA: Diagnosis not present

## 2018-11-09 DIAGNOSIS — R0781 Pleurodynia: Secondary | ICD-10-CM | POA: Diagnosis not present

## 2018-11-09 DIAGNOSIS — M545 Low back pain: Secondary | ICD-10-CM | POA: Diagnosis not present

## 2018-11-09 DIAGNOSIS — Y929 Unspecified place or not applicable: Secondary | ICD-10-CM | POA: Diagnosis not present

## 2018-11-09 DIAGNOSIS — Z79899 Other long term (current) drug therapy: Secondary | ICD-10-CM | POA: Insufficient documentation

## 2018-11-09 MED ORDER — CYCLOBENZAPRINE HCL 10 MG PO TABS: 10.0000 mg | ORAL_TABLET | Freq: Three times a day (TID) | ORAL | 0 refills | Status: DC

## 2018-11-09 MED ORDER — PROMETHAZINE HCL 12.5 MG PO TABS
12.5000 mg | ORAL_TABLET | Freq: Once | ORAL | Status: AC
  Administered 2018-11-09: 12.5 mg via ORAL
  Filled 2018-11-09: qty 1

## 2018-11-09 MED ORDER — MORPHINE SULFATE (PF) 10 MG/ML IV SOLN
10.0000 mg | Freq: Once | INTRAVENOUS | Status: AC
  Administered 2018-11-09: 14:00:00 10 mg via INTRAMUSCULAR
  Filled 2018-11-09: qty 1
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
## 2018-11-09 NOTE — ED Notes (Signed)
PA Marshell Levan at bedside at this time.

## 2018-11-09 NOTE — ED Notes (Signed)
PT becoming slightly aggitated and asking for his xray results. EDP made aware and will come talk with the pt about his results.
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
## 2018-11-09 NOTE — Discharge Instructions (Addendum)
Your chest x-ray is negative for any problems with the lungs.  Your rib films are negative for rib fracture or dislocation.  The x-ray of your lumbar spine shows a compression fracture at the L1 area.  There is question as to if this is new, or something that is been present in the past. Please have your MD schedule an MRI or CT of the lumbar spine to evaluate this change. Apply heating pad. Continue your current medications as ordered. Add flexeril for spasm pain. This medication may cause drowsiness. Please do not drink, drive, or participate in activity that requires concentration while taking this medication.

## 2018-11-11 DIAGNOSIS — I10 Essential (primary) hypertension: Secondary | ICD-10-CM | POA: Diagnosis not present

## 2018-11-11 DIAGNOSIS — G8929 Other chronic pain: Secondary | ICD-10-CM | POA: Diagnosis not present

## 2018-11-11 DIAGNOSIS — G4700 Insomnia, unspecified: Secondary | ICD-10-CM | POA: Diagnosis not present

## 2018-11-14 DIAGNOSIS — G8929 Other chronic pain: Secondary | ICD-10-CM | POA: Diagnosis not present

## 2018-11-14 DIAGNOSIS — G4700 Insomnia, unspecified: Secondary | ICD-10-CM | POA: Diagnosis not present

## 2018-11-14 DIAGNOSIS — I10 Essential (primary) hypertension: Secondary | ICD-10-CM | POA: Diagnosis not present

## 2018-11-16 DIAGNOSIS — G8929 Other chronic pain: Secondary | ICD-10-CM | POA: Diagnosis not present

## 2018-11-16 DIAGNOSIS — G4700 Insomnia, unspecified: Secondary | ICD-10-CM | POA: Diagnosis not present

## 2018-11-16 DIAGNOSIS — Z79891 Long term (current) use of opiate analgesic: Secondary | ICD-10-CM | POA: Diagnosis not present

## 2018-11-16 DIAGNOSIS — I10 Essential (primary) hypertension: Secondary | ICD-10-CM | POA: Diagnosis not present

## 2018-11-16 DIAGNOSIS — Z7689 Persons encountering health services in other specified circumstances: Secondary | ICD-10-CM | POA: Diagnosis not present

## 2018-11-17 ENCOUNTER — Other Ambulatory Visit: Payer: Self-pay

## 2018-11-18 ENCOUNTER — Ambulatory Visit (HOSPITAL_COMMUNITY): Payer: Medicaid Other

## 2018-11-18 ENCOUNTER — Other Ambulatory Visit: Payer: Self-pay

## 2018-11-18 ENCOUNTER — Ambulatory Visit (INDEPENDENT_AMBULATORY_CARE_PROVIDER_SITE_OTHER): Payer: Medicaid Other | Admitting: Family Medicine

## 2018-11-18 ENCOUNTER — Encounter: Payer: Self-pay | Admitting: Family Medicine

## 2018-11-18 VITALS — BP 129/75 | HR 76 | Temp 98.4°F | Ht 75.0 in | Wt 245.0 lb

## 2018-11-18 DIAGNOSIS — G8929 Other chronic pain: Secondary | ICD-10-CM

## 2018-11-18 DIAGNOSIS — M545 Low back pain, unspecified: Secondary | ICD-10-CM

## 2018-11-18 DIAGNOSIS — I10 Essential (primary) hypertension: Secondary | ICD-10-CM | POA: Diagnosis not present

## 2018-11-18 DIAGNOSIS — G4700 Insomnia, unspecified: Secondary | ICD-10-CM | POA: Diagnosis not present

## 2018-11-18 DIAGNOSIS — M549 Dorsalgia, unspecified: Secondary | ICD-10-CM

## 2018-11-21 ENCOUNTER — Ambulatory Visit: Payer: Medicaid Other | Admitting: Family Medicine

## 2018-11-21 DIAGNOSIS — G8929 Other chronic pain: Secondary | ICD-10-CM | POA: Diagnosis not present

## 2018-11-21 DIAGNOSIS — I10 Essential (primary) hypertension: Secondary | ICD-10-CM | POA: Diagnosis not present

## 2018-11-21 DIAGNOSIS — G4700 Insomnia, unspecified: Secondary | ICD-10-CM | POA: Diagnosis not present

## 2018-11-22 ENCOUNTER — Encounter: Payer: Self-pay | Admitting: Family Medicine

## 2018-11-22 DIAGNOSIS — I10 Essential (primary) hypertension: Secondary | ICD-10-CM | POA: Diagnosis not present

## 2018-11-22 DIAGNOSIS — G4700 Insomnia, unspecified: Secondary | ICD-10-CM | POA: Diagnosis not present

## 2018-11-22 DIAGNOSIS — G8929 Other chronic pain: Secondary | ICD-10-CM | POA: Diagnosis not present
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
## 2018-11-23 DIAGNOSIS — G8929 Other chronic pain: Secondary | ICD-10-CM | POA: Diagnosis not present

## 2018-11-23 DIAGNOSIS — G4700 Insomnia, unspecified: Secondary | ICD-10-CM | POA: Diagnosis not present

## 2018-11-23 DIAGNOSIS — I10 Essential (primary) hypertension: Secondary | ICD-10-CM | POA: Diagnosis not present

## 2018-11-24 ENCOUNTER — Telehealth: Payer: Self-pay | Admitting: *Deleted

## 2018-11-24 NOTE — Telephone Encounter (Signed)
Pt would like a manager to call him. He has a complaint on Dr. Livia Snellen and would like to change doctors.

## 2018-11-28 NOTE — Telephone Encounter (Signed)
Spoke with Mr. Gallick today about his concerns.  Will see if we can get him referred to a specialist to help with his fractured spine.  He is followed by [facility] for his pain meds.  Will give him a call today once we see how Dr. Livia Snellen wants to handle.  Carlon in xray will help on this for me.

## 2018-12-05 ENCOUNTER — Telehealth: Payer: Self-pay | Admitting: Family Medicine

## 2018-12-05 DIAGNOSIS — I10 Essential (primary) hypertension: Secondary | ICD-10-CM | POA: Diagnosis not present

## 2018-12-05 DIAGNOSIS — G4700 Insomnia, unspecified: Secondary | ICD-10-CM | POA: Diagnosis not present

## 2018-12-05 DIAGNOSIS — G8929 Other chronic pain: Secondary | ICD-10-CM | POA: Diagnosis not present
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
## 2018-12-05 NOTE — Telephone Encounter (Signed)
Referral placed and sent to [facility]

## 2018-12-07 DIAGNOSIS — I10 Essential (primary) hypertension: Secondary | ICD-10-CM | POA: Diagnosis not present

## 2018-12-07 DIAGNOSIS — G8929 Other chronic pain: Secondary | ICD-10-CM | POA: Diagnosis not present

## 2018-12-07 DIAGNOSIS — G4700 Insomnia, unspecified: Secondary | ICD-10-CM | POA: Diagnosis not present

## 2018-12-09 DIAGNOSIS — G8929 Other chronic pain: Secondary | ICD-10-CM | POA: Diagnosis not present

## 2018-12-09 DIAGNOSIS — G4700 Insomnia, unspecified: Secondary | ICD-10-CM | POA: Diagnosis not present

## 2018-12-09 DIAGNOSIS — I10 Essential (primary) hypertension: Secondary | ICD-10-CM | POA: Diagnosis not present

## 2018-12-11 ENCOUNTER — Other Ambulatory Visit: Payer: Self-pay | Admitting: Family Medicine

## 2018-12-12 ENCOUNTER — Other Ambulatory Visit: Payer: Self-pay | Admitting: Family Medicine

## 2018-12-12 DIAGNOSIS — G4700 Insomnia, unspecified: Secondary | ICD-10-CM | POA: Diagnosis not present

## 2018-12-12 DIAGNOSIS — G8929 Other chronic pain: Secondary | ICD-10-CM | POA: Diagnosis not present

## 2018-12-12 DIAGNOSIS — I10 Essential (primary) hypertension: Secondary | ICD-10-CM | POA: Diagnosis not present

## 2018-12-12 DIAGNOSIS — I6522 Occlusion and stenosis of left carotid artery: Secondary | ICD-10-CM

## 2018-12-16 DIAGNOSIS — I10 Essential (primary) hypertension: Secondary | ICD-10-CM | POA: Diagnosis not present

## 2018-12-16 DIAGNOSIS — G4700 Insomnia, unspecified: Secondary | ICD-10-CM | POA: Diagnosis not present

## 2018-12-16 DIAGNOSIS — G8929 Other chronic pain: Secondary | ICD-10-CM | POA: Diagnosis not present

## 2018-12-19 ENCOUNTER — Other Ambulatory Visit: Payer: Self-pay | Admitting: Family Medicine

## 2018-12-19 DIAGNOSIS — Z79891 Long term (current) use of opiate analgesic: Secondary | ICD-10-CM | POA: Diagnosis not present

## 2018-12-19 DIAGNOSIS — M5136 Other intervertebral disc degeneration, lumbar region: Secondary | ICD-10-CM | POA: Diagnosis not present

## 2018-12-19 DIAGNOSIS — G894 Chronic pain syndrome: Secondary | ICD-10-CM | POA: Diagnosis not present

## 2018-12-19 DIAGNOSIS — Z79899 Other long term (current) drug therapy: Secondary | ICD-10-CM | POA: Diagnosis not present

## 2018-12-20 DIAGNOSIS — G8929 Other chronic pain: Secondary | ICD-10-CM | POA: Diagnosis not present

## 2018-12-20 DIAGNOSIS — G4700 Insomnia, unspecified: Secondary | ICD-10-CM | POA: Diagnosis not present

## 2018-12-20 DIAGNOSIS — I10 Essential (primary) hypertension: Secondary | ICD-10-CM | POA: Diagnosis not present

## 2018-12-21 DIAGNOSIS — G4700 Insomnia, unspecified: Secondary | ICD-10-CM | POA: Diagnosis not present

## 2018-12-21 DIAGNOSIS — G8929 Other chronic pain: Secondary | ICD-10-CM | POA: Diagnosis not present

## 2018-12-21 DIAGNOSIS — I10 Essential (primary) hypertension: Secondary | ICD-10-CM | POA: Diagnosis not present

## 2018-12-22 DIAGNOSIS — G8929 Other chronic pain: Secondary | ICD-10-CM | POA: Diagnosis not present

## 2018-12-22 DIAGNOSIS — I10 Essential (primary) hypertension: Secondary | ICD-10-CM | POA: Diagnosis not present

## 2018-12-22 DIAGNOSIS — G4700 Insomnia, unspecified: Secondary | ICD-10-CM | POA: Diagnosis not present

## 2018-12-23 DIAGNOSIS — G8929 Other chronic pain: Secondary | ICD-10-CM | POA: Diagnosis not present

## 2018-12-23 DIAGNOSIS — G4700 Insomnia, unspecified: Secondary | ICD-10-CM | POA: Diagnosis not present

## 2018-12-23 DIAGNOSIS — I10 Essential (primary) hypertension: Secondary | ICD-10-CM | POA: Diagnosis not present

## 2018-12-24 DIAGNOSIS — I10 Essential (primary) hypertension: Secondary | ICD-10-CM | POA: Diagnosis not present

## 2018-12-24 DIAGNOSIS — G8929 Other chronic pain: Secondary | ICD-10-CM | POA: Diagnosis not present

## 2018-12-24 DIAGNOSIS — G4700 Insomnia, unspecified: Secondary | ICD-10-CM | POA: Diagnosis not present

## 2018-12-26 DIAGNOSIS — G8929 Other chronic pain: Secondary | ICD-10-CM | POA: Diagnosis not present

## 2018-12-26 DIAGNOSIS — I10 Essential (primary) hypertension: Secondary | ICD-10-CM | POA: Diagnosis not present

## 2018-12-26 DIAGNOSIS — G4700 Insomnia, unspecified: Secondary | ICD-10-CM | POA: Diagnosis not present

## 2018-12-27 DIAGNOSIS — G4700 Insomnia, unspecified: Secondary | ICD-10-CM | POA: Diagnosis not present

## 2018-12-27 DIAGNOSIS — I10 Essential (primary) hypertension: Secondary | ICD-10-CM | POA: Diagnosis not present

## 2018-12-27 DIAGNOSIS — G8929 Other chronic pain: Secondary | ICD-10-CM | POA: Diagnosis not present

## 2018-12-28 DIAGNOSIS — Z7689 Persons encountering health services in other specified circumstances: Secondary | ICD-10-CM | POA: Diagnosis not present

## 2018-12-28 DIAGNOSIS — C61 Malignant neoplasm of prostate: Secondary | ICD-10-CM | POA: Diagnosis not present

## 2018-12-29 ENCOUNTER — Ambulatory Visit (HOSPITAL_COMMUNITY)
Admission: RE | Admit: 2018-12-29 | Discharge: 2018-12-29 | Disposition: A | Discharge status: Home / Self Care | Payer: Medicaid Other | Source: Ambulatory Visit | Attending: Nurse Practitioner | Admitting: Nurse Practitioner

## 2018-12-29 ENCOUNTER — Other Ambulatory Visit: Payer: Self-pay

## 2018-12-29 DIAGNOSIS — M5136 Other intervertebral disc degeneration, lumbar region: Secondary | ICD-10-CM | POA: Diagnosis not present

## 2018-12-29 DIAGNOSIS — S32010A Wedge compression fracture of first lumbar vertebra, initial encounter for closed fracture: Secondary | ICD-10-CM | POA: Diagnosis not present

## 2018-12-29 LAB — POCT I-STAT CREATININE: Creatinine, Ser: 1 mg/dL (ref 0.61–1.24)

## 2018-12-29 MED ORDER — GADOBUTROL 1 MMOL/ML IV SOLN
10.0000 mL | Freq: Once | INTRAVENOUS | Status: AC | PRN
  Administered 2018-12-29: 09:00:00 10 mL via INTRAVENOUS

## 2018-12-30 DIAGNOSIS — Z8673 Personal history of transient ischemic attack (TIA), and cerebral infarction without residual deficits: Secondary | ICD-10-CM | POA: Insufficient documentation

## 2018-12-30 DIAGNOSIS — G8929 Other chronic pain: Secondary | ICD-10-CM | POA: Diagnosis not present

## 2018-12-30 DIAGNOSIS — Z8546 Personal history of malignant neoplasm of prostate: Secondary | ICD-10-CM | POA: Insufficient documentation

## 2018-12-30 DIAGNOSIS — M4856XA Collapsed vertebra, not elsewhere classified, lumbar region, initial encounter for fracture: Secondary | ICD-10-CM | POA: Diagnosis not present

## 2018-12-30 DIAGNOSIS — G4700 Insomnia, unspecified: Secondary | ICD-10-CM | POA: Diagnosis not present

## 2018-12-30 DIAGNOSIS — I10 Essential (primary) hypertension: Secondary | ICD-10-CM | POA: Diagnosis not present

## 2019-01-02 DIAGNOSIS — I10 Essential (primary) hypertension: Secondary | ICD-10-CM | POA: Diagnosis not present

## 2019-01-02 DIAGNOSIS — G4700 Insomnia, unspecified: Secondary | ICD-10-CM | POA: Diagnosis not present

## 2019-01-02 DIAGNOSIS — G8929 Other chronic pain: Secondary | ICD-10-CM | POA: Diagnosis not present

## 2019-01-06 DIAGNOSIS — G4700 Insomnia, unspecified: Secondary | ICD-10-CM | POA: Diagnosis not present

## 2019-01-06 DIAGNOSIS — G8929 Other chronic pain: Secondary | ICD-10-CM | POA: Diagnosis not present

## 2019-01-06 DIAGNOSIS — I10 Essential (primary) hypertension: Secondary | ICD-10-CM | POA: Diagnosis not present

## 2019-01-10 DIAGNOSIS — I10 Essential (primary) hypertension: Secondary | ICD-10-CM | POA: Diagnosis not present

## 2019-01-10 DIAGNOSIS — G8929 Other chronic pain: Secondary | ICD-10-CM | POA: Diagnosis not present

## 2019-01-10 DIAGNOSIS — G4700 Insomnia, unspecified: Secondary | ICD-10-CM | POA: Diagnosis not present

## 2019-01-11 DIAGNOSIS — G8929 Other chronic pain: Secondary | ICD-10-CM | POA: Diagnosis not present

## 2019-01-11 DIAGNOSIS — G4700 Insomnia, unspecified: Secondary | ICD-10-CM | POA: Diagnosis not present

## 2019-01-11 DIAGNOSIS — I10 Essential (primary) hypertension: Secondary | ICD-10-CM | POA: Diagnosis not present

## 2019-01-12 DIAGNOSIS — G4700 Insomnia, unspecified: Secondary | ICD-10-CM | POA: Diagnosis not present

## 2019-01-12 DIAGNOSIS — I10 Essential (primary) hypertension: Secondary | ICD-10-CM | POA: Diagnosis not present

## 2019-01-12 DIAGNOSIS — G8929 Other chronic pain: Secondary | ICD-10-CM | POA: Diagnosis not present

## 2019-01-13 DIAGNOSIS — I10 Essential (primary) hypertension: Secondary | ICD-10-CM | POA: Diagnosis not present

## 2019-01-13 DIAGNOSIS — G4700 Insomnia, unspecified: Secondary | ICD-10-CM | POA: Diagnosis not present

## 2019-01-13 DIAGNOSIS — G8929 Other chronic pain: Secondary | ICD-10-CM | POA: Diagnosis not present

## 2019-01-17 DIAGNOSIS — Z7689 Persons encountering health services in other specified circumstances: Secondary | ICD-10-CM | POA: Diagnosis not present

## 2019-01-17 DIAGNOSIS — M5136 Other intervertebral disc degeneration, lumbar region: Secondary | ICD-10-CM | POA: Diagnosis not present

## 2019-01-17 DIAGNOSIS — Z79899 Other long term (current) drug therapy: Secondary | ICD-10-CM | POA: Diagnosis not present

## 2019-01-17 DIAGNOSIS — G894 Chronic pain syndrome: Secondary | ICD-10-CM | POA: Diagnosis not present

## 2019-01-20 DIAGNOSIS — G8929 Other chronic pain: Secondary | ICD-10-CM | POA: Diagnosis not present

## 2019-01-20 DIAGNOSIS — G4700 Insomnia, unspecified: Secondary | ICD-10-CM | POA: Diagnosis not present

## 2019-01-20 DIAGNOSIS — I10 Essential (primary) hypertension: Secondary | ICD-10-CM | POA: Diagnosis not present

## 2019-01-21 ENCOUNTER — Other Ambulatory Visit: Payer: Self-pay | Admitting: Family Medicine

## 2019-01-21 DIAGNOSIS — I6522 Occlusion and stenosis of left carotid artery: Secondary | ICD-10-CM

## 2019-01-23 DIAGNOSIS — G4700 Insomnia, unspecified: Secondary | ICD-10-CM | POA: Diagnosis not present

## 2019-01-23 DIAGNOSIS — G8929 Other chronic pain: Secondary | ICD-10-CM | POA: Diagnosis not present

## 2019-01-23 DIAGNOSIS — I10 Essential (primary) hypertension: Secondary | ICD-10-CM | POA: Diagnosis not present

## 2019-01-24 DIAGNOSIS — I10 Essential (primary) hypertension: Secondary | ICD-10-CM | POA: Diagnosis not present

## 2019-01-24 DIAGNOSIS — G8929 Other chronic pain: Secondary | ICD-10-CM | POA: Diagnosis not present

## 2019-01-24 DIAGNOSIS — G4700 Insomnia, unspecified: Secondary | ICD-10-CM | POA: Diagnosis not present

## 2019-01-25 DIAGNOSIS — I10 Essential (primary) hypertension: Secondary | ICD-10-CM | POA: Diagnosis not present

## 2019-01-25 DIAGNOSIS — G8929 Other chronic pain: Secondary | ICD-10-CM | POA: Diagnosis not present

## 2019-01-25 DIAGNOSIS — G4700 Insomnia, unspecified: Secondary | ICD-10-CM | POA: Diagnosis not present

## 2019-01-29 DIAGNOSIS — G8929 Other chronic pain: Secondary | ICD-10-CM | POA: Diagnosis not present

## 2019-01-29 DIAGNOSIS — G4700 Insomnia, unspecified: Secondary | ICD-10-CM | POA: Diagnosis not present

## 2019-01-29 DIAGNOSIS — I10 Essential (primary) hypertension: Secondary | ICD-10-CM | POA: Diagnosis not present

## 2019-01-31 ENCOUNTER — Encounter: Payer: Self-pay | Admitting: Family Medicine

## 2019-01-31 ENCOUNTER — Other Ambulatory Visit: Payer: Self-pay | Admitting: Family Medicine

## 2019-01-31 ENCOUNTER — Other Ambulatory Visit: Payer: Self-pay

## 2019-01-31 ENCOUNTER — Ambulatory Visit (INDEPENDENT_AMBULATORY_CARE_PROVIDER_SITE_OTHER): Payer: Medicaid Other | Admitting: Family Medicine

## 2019-01-31 ENCOUNTER — Telehealth: Payer: Self-pay | Admitting: *Deleted

## 2019-01-31 DIAGNOSIS — I6522 Occlusion and stenosis of left carotid artery: Secondary | ICD-10-CM

## 2019-01-31 MED ORDER — TRAZODONE HCL 150 MG PO TABS: 300.0000 mg | ORAL_TABLET | Freq: Every day | ORAL | 5 refills | Status: DC

## 2019-01-31 MED ORDER — METOPROLOL SUCCINATE ER 50 MG PO TB24: 50.0000 mg | ORAL_TABLET | Freq: Every day | ORAL | 1 refills | Status: DC

## 2019-01-31 MED ORDER — APIXABAN 5 MG PO TABS: 5.0000 mg | ORAL_TABLET | Freq: Two times a day (BID) | ORAL | 5 refills | Status: DC

## 2019-01-31 MED ORDER — ATORVASTATIN CALCIUM 40 MG PO TABS: 40.0000 mg | ORAL_TABLET | Freq: Every day | ORAL | 0 refills | Status: DC

## 2019-01-31 MED ORDER — OMEPRAZOLE 20 MG PO CPDR: 20.0000 mg | DELAYED_RELEASE_CAPSULE | Freq: Every day | ORAL | 1 refills | Status: DC

## 2019-01-31 MED ORDER — DULOXETINE HCL 60 MG PO CPEP: 60.0000 mg | ORAL_CAPSULE | Freq: Every day | ORAL | 3 refills | Status: DC

## 2019-01-31 MED ORDER — DULOXETINE HCL 40 MG PO CPEP: 40.0000 mg | ORAL_CAPSULE | Freq: Every day | ORAL | 1 refills | Status: DC

## 2019-01-31 MED ORDER — ZOLPIDEM TARTRATE 10 MG PO TABS: 10.0000 mg | ORAL_TABLET | Freq: Every day | ORAL | 2 refills | Status: DC

## 2019-01-31 NOTE — Progress Notes (Signed)
Subjective:    Patient ID: Calvin Aguirre, male    DOB: 1961/01/15, 58 y.o.   MRN: [id number]   HPI: Calvin Aguirre is a 58 y.o. male presenting for recheck of insomnia. Has to use both ambien and trazodone for adequate sleep. Out trazodone, got to sleep but couln't stay asleep. No  Sleep amnesia symptoms noted. Feels rested when using the combination of ambien to get him to sleep and trazodone to help him stay asleep.   Getting irritablity from being anxious Thinks he needs more cymbalta to help control this.    BP 125/71 HR 79 today. Needs follow-up of hypertension. Patient has no history of headache chest pain or shortness of breath or recent cough. Patient also denies symptoms of TIA such as numbness weakness lateralizing. Patient checks  blood pressure at home and has not had any elevated readings recently. Patient denies side effects from his medication. States taking it regularly.     Depression screen [facility] 2/9 05/31/2018 12/02/2017 07/13/2017 04/15/2017 01/14/2017  Decreased Interest 1 0 0 0 0  Down, Depressed, Hopeless 0 0 0 0 0  PHQ - 2 Score 1 0 0 0 0  Altered sleeping - - - - -  Tired, decreased energy - - - - -  Change in appetite - - - - -  Feeling bad or failure about yourself  - - - - -  Trouble concentrating - - - - -  Moving slowly or fidgety/restless - - - - -  Suicidal thoughts - - - - -  PHQ-9 Score - - - - -  Difficult doing work/chores - - - - -     Relevant past medical, surgical, family and social history reviewed and updated as indicated.  Interim medical history since our last visit reviewed. Allergies and medications reviewed and updated.  ROS:  Review of Systems   Social History   Tobacco Use  Smoking Status Current Every Day Smoker  . Packs/day: 2.00  . Years: 20.00  . Pack years: 40.00  . Types: Cigarettes  . Start date: 09/22/1976  Smokeless Tobacco Never Used       Objective:     Wt Readings from Last 3 Encounters:  11/18/18 245 lb  (111.1 kg)  11/09/18 250 lb (113.4 kg)  08/01/18 246 lb (111.6 kg)     Exam deferred. Pt. Harboring due to COVID 19. Phone visit performed.   Assessment & Plan:   1. Occlusion of left carotid artery     Meds ordered this encounter  Medications  . DISCONTD: traZODone (DESYREL) 150 MG tablet    Sig: Take 2 tablets (300 mg total) by mouth at bedtime.    Dispense:  60 tablet    Refill:  5  . DULoxetine 40 MG CPEP    Sig: Take 40 mg by mouth daily. (Please make 6 mos ckup)    Dispense:  90 capsule    Refill:  1    This prescription was filled on 09/23/2018. Any refills authorized will be placed on file.  . zolpidem (AMBIEN) 10 MG tablet    Sig: Take 1 tablet (10 mg total) by mouth at bedtime.    Dispense:  30 tablet    Refill:  2    This prescription was filled on 11/22/2018. Any refills authorized will be placed on file.  . traZODone (DESYREL) 150 MG tablet    Sig: Take 2 tablets (300 mg total) by mouth at bedtime.  Dispense:  60 tablet    Refill:  5  . atorvastatin (LIPITOR) 40 MG tablet    Sig: Take 1 tablet (40 mg total) by mouth daily. (Please make 6 mos appt)    Dispense:  30 tablet    Refill:  0  . omeprazole (PRILOSEC) 20 MG capsule    Sig: Take 1 capsule (20 mg total) by mouth daily. (Please make 6 mos ckup)    Dispense:  90 capsule    Refill:  1  . metoprolol succinate (TOPROL-XL) 50 MG 24 hr tablet    Sig: Take 1 tablet (50 mg total) by mouth daily. IMMEDIATELY FOLLOWING A MEAL (Please make 6 mos ckup)    Dispense:  90 tablet    Refill:  1  . apixaban (ELIQUIS) 5 MG TABS tablet    Sig: Take 1 tablet (5 mg total) by mouth 2 (two) times daily.    Dispense:  60 tablet    Refill:  5    No orders of the defined types were placed in this encounter.     Diagnoses and all orders for this visit:  Occlusion of left carotid artery -     atorvastatin (LIPITOR) 40 MG tablet; Take 1 tablet (40 mg total) by mouth daily. (Please make 6 mos appt)  Other orders -      Discontinue: traZODone (DESYREL) 150 MG tablet; Take 2 tablets (300 mg total) by mouth at bedtime. -     DULoxetine 40 MG CPEP; Take 40 mg by mouth daily. (Please make 6 mos ckup) -     zolpidem (AMBIEN) 10 MG tablet; Take 1 tablet (10 mg total) by mouth at bedtime. -     traZODone (DESYREL) 150 MG tablet; Take 2 tablets (300 mg total) by mouth at bedtime. -     omeprazole (PRILOSEC) 20 MG capsule; Take 1 capsule (20 mg total) by mouth daily. (Please make 6 mos ckup) -     metoprolol succinate (TOPROL-XL) 50 MG 24 hr tablet; Take 1 tablet (50 mg total) by mouth daily. IMMEDIATELY FOLLOWING A MEAL (Please make 6 mos ckup) -     apixaban (ELIQUIS) 5 MG TABS tablet; Take 1 tablet (5 mg total) by mouth 2 (two) times daily.    Virtual Visit via telephone Note  I discussed the limitations, risks, security and privacy concerns of performing an evaluation and management service by telephone and the availability of in person appointments. The patient was identified with two identifiers. Pt.expressed understanding and agreed to proceed. Pt. Is at home. Dr. Livia Snellen is in his office.  Follow Up Instructions:   I discussed the assessment and treatment plan with the patient. The patient was provided an opportunity to ask questions and all were answered. The patient agreed with the plan and demonstrated an understanding of the instructions.   The patient was advised to call back or seek an in-person evaluation if the symptoms worsen or if the condition fails to improve as anticipated.   Total minutes including chart review and phone contact time: 20   Follow up plan: No follow-ups on file.  Claretta Fraise, MD [place]

## 2019-01-31 NOTE — Telephone Encounter (Signed)
Prior Auth Request for Duloxetine 40 mg CPEP   Did you mean to send in the duloxetine controlled release particles that can be sprinkled on food? Not sure this will be approved? Please advise.

## 2019-01-31 NOTE — Telephone Encounter (Signed)
That's not what I intended. I sent a replacement. Hopefully will go through. Thanks, WS

## 2019-02-02 DIAGNOSIS — G4700 Insomnia, unspecified: Secondary | ICD-10-CM | POA: Diagnosis not present

## 2019-02-02 DIAGNOSIS — I10 Essential (primary) hypertension: Secondary | ICD-10-CM | POA: Diagnosis not present

## 2019-02-02 DIAGNOSIS — G8929 Other chronic pain: Secondary | ICD-10-CM | POA: Diagnosis not present

## 2019-02-03 DIAGNOSIS — G8929 Other chronic pain: Secondary | ICD-10-CM | POA: Diagnosis not present

## 2019-02-03 DIAGNOSIS — G4700 Insomnia, unspecified: Secondary | ICD-10-CM | POA: Diagnosis not present

## 2019-02-03 DIAGNOSIS — I10 Essential (primary) hypertension: Secondary | ICD-10-CM | POA: Diagnosis not present

## 2019-02-06 ENCOUNTER — Telehealth: Payer: Self-pay | Admitting: Family Medicine

## 2019-02-06 DIAGNOSIS — M545 Low back pain, unspecified: Secondary | ICD-10-CM

## 2019-02-06 DIAGNOSIS — G8929 Other chronic pain: Secondary | ICD-10-CM

## 2019-02-06 NOTE — Telephone Encounter (Signed)
Pt called and aware of referral placement

## 2019-02-06 NOTE — Telephone Encounter (Signed)
Please refer as pt requests 

## 2019-02-06 NOTE — Telephone Encounter (Signed)
REFERRAL REQUEST Telephone Note  What type of referral do you need? PAIN MANAGEMENT  Have you been seen at our office for this problem? Yes (Advise that they will likely need an appointment with their PCP before a referral can be done)  Is there a particular doctor or location that you prefer? [facility] [place]  Patient notified that referrals can take up to a week or longer to process. If they haven't heard anything within a week they should call back and speak with the referral department.

## 2019-02-08 DIAGNOSIS — G8929 Other chronic pain: Secondary | ICD-10-CM | POA: Diagnosis not present

## 2019-02-08 DIAGNOSIS — I10 Essential (primary) hypertension: Secondary | ICD-10-CM | POA: Diagnosis not present

## 2019-02-08 DIAGNOSIS — G4700 Insomnia, unspecified: Secondary | ICD-10-CM | POA: Diagnosis not present

## 2019-02-11 DIAGNOSIS — G4700 Insomnia, unspecified: Secondary | ICD-10-CM | POA: Diagnosis not present

## 2019-02-11 DIAGNOSIS — I10 Essential (primary) hypertension: Secondary | ICD-10-CM | POA: Diagnosis not present

## 2019-02-11 DIAGNOSIS — G8929 Other chronic pain: Secondary | ICD-10-CM | POA: Diagnosis not present

## 2019-02-13 DIAGNOSIS — I10 Essential (primary) hypertension: Secondary | ICD-10-CM | POA: Diagnosis not present

## 2019-02-13 DIAGNOSIS — G4700 Insomnia, unspecified: Secondary | ICD-10-CM | POA: Diagnosis not present

## 2019-02-13 DIAGNOSIS — G8929 Other chronic pain: Secondary | ICD-10-CM | POA: Diagnosis not present

## 2019-02-14 DIAGNOSIS — G8929 Other chronic pain: Secondary | ICD-10-CM | POA: Diagnosis not present

## 2019-02-14 DIAGNOSIS — G4700 Insomnia, unspecified: Secondary | ICD-10-CM | POA: Diagnosis not present

## 2019-02-14 DIAGNOSIS — I10 Essential (primary) hypertension: Secondary | ICD-10-CM | POA: Diagnosis not present

## 2019-02-15 DIAGNOSIS — G8929 Other chronic pain: Secondary | ICD-10-CM | POA: Diagnosis not present

## 2019-02-15 DIAGNOSIS — R350 Frequency of micturition: Secondary | ICD-10-CM | POA: Diagnosis not present

## 2019-02-15 DIAGNOSIS — C61 Malignant neoplasm of prostate: Secondary | ICD-10-CM | POA: Diagnosis not present

## 2019-02-15 DIAGNOSIS — I10 Essential (primary) hypertension: Secondary | ICD-10-CM | POA: Diagnosis not present

## 2019-02-15 DIAGNOSIS — G4700 Insomnia, unspecified: Secondary | ICD-10-CM | POA: Diagnosis not present

## 2019-02-15 DIAGNOSIS — Z7689 Persons encountering health services in other specified circumstances: Secondary | ICD-10-CM | POA: Diagnosis not present

## 2019-02-15 DIAGNOSIS — N393 Stress incontinence (female) (male): Secondary | ICD-10-CM | POA: Diagnosis not present

## 2019-02-17 DIAGNOSIS — G8929 Other chronic pain: Secondary | ICD-10-CM | POA: Diagnosis not present

## 2019-02-17 DIAGNOSIS — G4700 Insomnia, unspecified: Secondary | ICD-10-CM | POA: Diagnosis not present

## 2019-02-17 DIAGNOSIS — I10 Essential (primary) hypertension: Secondary | ICD-10-CM | POA: Diagnosis not present

## 2019-02-18 DIAGNOSIS — G4700 Insomnia, unspecified: Secondary | ICD-10-CM | POA: Diagnosis not present

## 2019-02-18 DIAGNOSIS — I10 Essential (primary) hypertension: Secondary | ICD-10-CM | POA: Diagnosis not present

## 2019-02-18 DIAGNOSIS — G8929 Other chronic pain: Secondary | ICD-10-CM | POA: Diagnosis not present

## 2019-02-19 DIAGNOSIS — G4700 Insomnia, unspecified: Secondary | ICD-10-CM | POA: Diagnosis not present

## 2019-02-19 DIAGNOSIS — I10 Essential (primary) hypertension: Secondary | ICD-10-CM | POA: Diagnosis not present

## 2019-02-19 DIAGNOSIS — G8929 Other chronic pain: Secondary | ICD-10-CM | POA: Diagnosis not present

## 2019-02-20 ENCOUNTER — Other Ambulatory Visit (HOSPITAL_COMMUNITY): Payer: Self-pay | Admitting: Interventional Radiology

## 2019-02-21 ENCOUNTER — Other Ambulatory Visit (HOSPITAL_COMMUNITY): Payer: Self-pay | Admitting: Interventional Radiology

## 2019-02-21 ENCOUNTER — Telehealth (HOSPITAL_COMMUNITY): Payer: Self-pay

## 2019-02-21 DIAGNOSIS — G8929 Other chronic pain: Secondary | ICD-10-CM | POA: Diagnosis not present

## 2019-02-21 DIAGNOSIS — S32010A Wedge compression fracture of first lumbar vertebra, initial encounter for closed fracture: Secondary | ICD-10-CM

## 2019-02-21 DIAGNOSIS — G4700 Insomnia, unspecified: Secondary | ICD-10-CM | POA: Diagnosis not present

## 2019-02-21 DIAGNOSIS — I10 Essential (primary) hypertension: Secondary | ICD-10-CM | POA: Diagnosis not present

## 2019-02-21 DIAGNOSIS — S22080A Wedge compression fracture of T11-T12 vertebra, initial encounter for closed fracture: Secondary | ICD-10-CM

## 2019-02-21 NOTE — Telephone Encounter (Signed)
-----   Message from [place], [place] sent at 02/16/2019 12:57 PM EDT ----- Calvin Aguirre has  approved [facility] Code for T12 and L1 kyphoplasty.  Schedule at patient convenience.   Thanks,  Sherlie Ban

## 2019-02-22 ENCOUNTER — Other Ambulatory Visit: Payer: Self-pay | Admitting: Radiology

## 2019-02-22 ENCOUNTER — Telehealth (HOSPITAL_COMMUNITY): Payer: Self-pay

## 2019-02-22 DIAGNOSIS — G4700 Insomnia, unspecified: Secondary | ICD-10-CM | POA: Diagnosis not present

## 2019-02-22 DIAGNOSIS — I10 Essential (primary) hypertension: Secondary | ICD-10-CM | POA: Diagnosis not present

## 2019-02-22 DIAGNOSIS — G8929 Other chronic pain: Secondary | ICD-10-CM | POA: Diagnosis not present

## 2019-02-22 NOTE — Telephone Encounter (Signed)
Returned pt's call to reschedule kp, no answer, no vm. AW

## 2019-02-23 ENCOUNTER — Telehealth: Payer: Self-pay | Admitting: *Deleted

## 2019-02-23 DIAGNOSIS — G8929 Other chronic pain: Secondary | ICD-10-CM | POA: Diagnosis not present

## 2019-02-23 DIAGNOSIS — G4700 Insomnia, unspecified: Secondary | ICD-10-CM | POA: Diagnosis not present

## 2019-02-23 DIAGNOSIS — I10 Essential (primary) hypertension: Secondary | ICD-10-CM | POA: Diagnosis not present

## 2019-02-23 NOTE — Telephone Encounter (Signed)
Patient verbally consented for telehealth visits with [facility] and understands that his insurance company will be billed for the encounter.   Aware to have vitals available 

## 2019-02-24 ENCOUNTER — Ambulatory Visit (HOSPITAL_COMMUNITY): Admission: RE | Admit: 2019-02-24 | Payer: No Typology Code available for payment source | Source: Ambulatory Visit

## 2019-02-24 DIAGNOSIS — G8929 Other chronic pain: Secondary | ICD-10-CM | POA: Diagnosis not present

## 2019-02-24 DIAGNOSIS — I10 Essential (primary) hypertension: Secondary | ICD-10-CM | POA: Diagnosis not present

## 2019-02-24 DIAGNOSIS — G4700 Insomnia, unspecified: Secondary | ICD-10-CM | POA: Diagnosis not present

## 2019-03-01 ENCOUNTER — Encounter: Payer: Medicaid Other | Admitting: Cardiology

## 2019-03-01 ENCOUNTER — Other Ambulatory Visit: Payer: Self-pay | Admitting: Radiology

## 2019-03-01 DIAGNOSIS — G4700 Insomnia, unspecified: Secondary | ICD-10-CM | POA: Diagnosis not present

## 2019-03-01 DIAGNOSIS — I10 Essential (primary) hypertension: Secondary | ICD-10-CM | POA: Diagnosis not present

## 2019-03-01 DIAGNOSIS — G8929 Other chronic pain: Secondary | ICD-10-CM | POA: Diagnosis not present

## 2019-03-01 NOTE — Progress Notes (Signed)
This encounter was created in error - please disregard.

## 2019-03-02 ENCOUNTER — Other Ambulatory Visit: Payer: Self-pay

## 2019-03-02 ENCOUNTER — Ambulatory Visit (HOSPITAL_COMMUNITY)
Admission: RE | Admit: 2019-03-02 | Discharge: 2019-03-02 | Disposition: A | Discharge status: Home / Self Care | Payer: Medicaid Other | Source: Ambulatory Visit | Attending: Interventional Radiology | Admitting: Interventional Radiology

## 2019-03-02 ENCOUNTER — Encounter (HOSPITAL_COMMUNITY): Payer: Self-pay

## 2019-03-02 ENCOUNTER — Other Ambulatory Visit: Payer: Self-pay | Admitting: Radiology

## 2019-03-02 DIAGNOSIS — Z7901 Long term (current) use of anticoagulants: Secondary | ICD-10-CM | POA: Insufficient documentation

## 2019-03-02 DIAGNOSIS — E785 Hyperlipidemia, unspecified: Secondary | ICD-10-CM | POA: Diagnosis not present

## 2019-03-02 DIAGNOSIS — F17210 Nicotine dependence, cigarettes, uncomplicated: Secondary | ICD-10-CM | POA: Diagnosis not present

## 2019-03-02 DIAGNOSIS — F329 Major depressive disorder, single episode, unspecified: Secondary | ICD-10-CM | POA: Insufficient documentation

## 2019-03-02 DIAGNOSIS — K219 Gastro-esophageal reflux disease without esophagitis: Secondary | ICD-10-CM | POA: Insufficient documentation

## 2019-03-02 DIAGNOSIS — I480 Paroxysmal atrial fibrillation: Secondary | ICD-10-CM | POA: Diagnosis not present

## 2019-03-02 DIAGNOSIS — S32010A Wedge compression fracture of first lumbar vertebra, initial encounter for closed fracture: Secondary | ICD-10-CM | POA: Diagnosis present

## 2019-03-02 DIAGNOSIS — Z8546 Personal history of malignant neoplasm of prostate: Secondary | ICD-10-CM | POA: Insufficient documentation

## 2019-03-02 DIAGNOSIS — Z79899 Other long term (current) drug therapy: Secondary | ICD-10-CM | POA: Diagnosis not present

## 2019-03-02 DIAGNOSIS — Z8673 Personal history of transient ischemic attack (TIA), and cerebral infarction without residual deficits: Secondary | ICD-10-CM | POA: Diagnosis not present

## 2019-03-02 DIAGNOSIS — M4855XA Collapsed vertebra, not elsewhere classified, thoracolumbar region, initial encounter for fracture: Secondary | ICD-10-CM | POA: Diagnosis not present

## 2019-03-02 DIAGNOSIS — I10 Essential (primary) hypertension: Secondary | ICD-10-CM | POA: Diagnosis not present

## 2019-03-02 DIAGNOSIS — S22080A Wedge compression fracture of T11-T12 vertebra, initial encounter for closed fracture: Secondary | ICD-10-CM | POA: Diagnosis not present

## 2019-03-02 DIAGNOSIS — F419 Anxiety disorder, unspecified: Secondary | ICD-10-CM | POA: Insufficient documentation

## 2019-03-02 DIAGNOSIS — Z7689 Persons encountering health services in other specified circumstances: Secondary | ICD-10-CM | POA: Diagnosis not present

## 2019-03-02 LAB — BASIC METABOLIC PANEL
Anion gap: 9 (ref 5–15)
BUN: 11 mg/dL (ref 6–20)
CO2: 24 mmol/L (ref 22–32)
Calcium: 8.8 mg/dL — ABNORMAL LOW (ref 8.9–10.3)
Chloride: 105 mmol/L (ref 98–111)
Creatinine, Ser: 1.18 mg/dL (ref 0.61–1.24)
GFR calc Af Amer: >60 mL/min (ref >60)
GFR calc non Af Amer: >60 mL/min (ref >60)
Glucose, Bld: 107 mg/dL — ABNORMAL HIGH (ref 70–99)
Potassium: 3.8 mmol/L (ref 3.5–5.1)
Sodium: 138 mmol/L (ref 135–145)

## 2019-03-02 LAB — CBC WITH DIFFERENTIAL/PLATELET
Abs Immature Granulocytes: 0.06 10*3/uL (ref 0.00–0.07)
Basophils Absolute: 0.1 10*3/uL (ref 0.0–0.1)
Basophils Relative: 1 %
Eosinophils Absolute: 0.3 10*3/uL (ref 0.0–0.5)
Eosinophils Relative: 4 %
HCT: 42.5 % (ref 39.0–52.0)
Hemoglobin: 14.8 g/dL (ref 13.0–17.0)
Immature Granulocytes: 1 %
Lymphocytes Relative: 17 %
Lymphs Abs: 1.3 10*3/uL (ref 0.7–4.0)
MCH: 32.2 pg (ref 26.0–34.0)
MCHC: 34.8 g/dL (ref 30.0–36.0)
MCV: 92.6 fL (ref 80.0–100.0)
Monocytes Absolute: 0.8 10*3/uL (ref 0.1–1.0)
Monocytes Relative: 10 %
Neutro Abs: 5.2 10*3/uL (ref 1.7–7.7)
Neutrophils Relative %: 67 %
Platelets: 153 10*3/uL (ref 150–400)
RBC: 4.59 MIL/uL (ref 4.22–5.81)
RDW: 14.9 % (ref 11.5–15.5)
WBC: 7.7 10*3/uL (ref 4.0–10.5)
nRBC: 0 % (ref 0.0–0.2)

## 2019-03-02 LAB — URINALYSIS, COMPLETE (UACMP) WITH MICROSCOPIC
Bacteria, UA: NONE SEEN
Bilirubin Urine: NEGATIVE
Glucose, UA: NEGATIVE mg/dL
Hgb urine dipstick: NEGATIVE
Ketones, ur: NEGATIVE mg/dL
Leukocytes,Ua: NEGATIVE
Nitrite: NEGATIVE
Protein, ur: NEGATIVE mg/dL
Specific Gravity, Urine: 1.016 (ref 1.005–1.030)
pH: 6 (ref 5.0–8.0)

## 2019-03-02 LAB — PROTIME-INR
INR: 1.1 (ref 0.8–1.2)
Prothrombin Time: 13.8 seconds (ref 11.4–15.2)

## 2019-03-02 MED ORDER — CEFAZOLIN SODIUM-DEXTROSE 2-4 GM/100ML-% IV SOLN: 2.0000 g | INTRAVENOUS | Status: DC

## 2019-03-02 MED ORDER — SODIUM CHLORIDE 0.9 % IV SOLN
INTRAVENOUS | Status: DC
  Administered 2019-03-02: 10:00:00 via INTRAVENOUS

## 2019-03-02 MED ORDER — KETOROLAC TROMETHAMINE 30 MG/ML IJ SOLN
30.0000 mg | Freq: Once | INTRAMUSCULAR | Status: AC
  Administered 2019-03-02: 30 mg via INTRAVENOUS

## 2019-03-02 MED ORDER — CEFAZOLIN SODIUM-DEXTROSE 2-4 GM/100ML-% IV SOLN
INTRAVENOUS | Status: AC
  Filled 2019-03-02: qty 100

## 2019-03-02 MED ORDER — KETOROLAC TROMETHAMINE 30 MG/ML IJ SOLN
INTRAMUSCULAR | Status: AC
  Filled 2019-03-02: qty 1
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
## 2019-03-02 NOTE — Discharge Instructions (Addendum)
Percutaneous Vertebroplasty, Care After °This sheet gives you information about how to care for yourself after your procedure. Your doctor may also give you more specific instructions. If you have problems or questions, contact your doctor. °Follow these instructions at home: °Surgical cut (incision) care °· Follow instructions from your doctor about how to take care of your cut from surgery. Make sure you: °? Wash your hands with soap and water before you change your bandage (dressing). If you cannot use soap and water, use hand sanitizer. °? Change your bandage as told by your doctor. °? Leave stitches (sutures), skin glue, or skin tape (adhesive) strips in place. They may need to stay in place for 2 weeks or longer. If tape strips get loose and curl up, you may trim the loose edges. Do not remove tape strips completely unless your doctor says it is okay. °· Check your surgical cut area every day for signs of infection. Check for: °? Redness, swelling, or pain. °? Fluid or blood. °? Warmth. °? Pus or a bad smell. °· Keep the bandage dry as told by your doctor. Do not shower or bathe until your doctor says it is okay. °Managing pain, stiffness, and swelling ° °· If directed, apply ice to the affected area: °? Put ice in a plastic bag. °? Place a towel between your skin and bag. °? Leave the ice on for 20 minutes, 2-3 times a day. °· Rest for 24 hrs after the procedure or as told by your doctor. °Activity °· Slowly return to normal activities as told by your doctor. °· Ask what type of stretching and strengthening exercises you should do. °· Do not bend or lift anything greater than 10 lb (4.5 kg). Follow your doctor's instructions about bending and lifting. °General instructions °· Take over-the-counter and prescription medicines only as told by your doctor. °· Do not drive for 24 hours if you were given a medicine to help you relax (sedative). °· To prevent or treat constipation while you are taking prescription  pain medicine, your doctor may recommend that you: °? Drink enough fluid to keep your pee (urine) clear or pale yellow. °? Take over-the-counter or prescription medicines. °? Eat foods that are high in fiber, such as: °§ Fresh fruits. °§ Fresh vegetables. °§ Whole grains. °§ Beans. °? Limit foods that are high in fat and processed sugars, such as fried and sweet foods. °? Keep all follow-up visits as told by your doctor. This is important. °Contact a doctor if: °· You have redness, swelling, or pain around your cut. °· You have fluid or blood coming from your cut. °· Your cut feels warm to the touch. °· You have pus or bad smell coming from your cut. °· You have a fever. °· You are sick to your stomach (nauseous) or throw up (vomit) for more than 24 hours. °· Your back pain does not get better. °Get help right away if: °· You have very bad back pain that comes on all of a sudden. °· You cannot control when you pee or poop (bowel movement). °· You lose feeling (become numb) or have tingling in your legs or feet, or they become weak. °· You have new tingling, numbness, or weakness in your legs or feet. °· You have sudden weakness in your legs. °· You have pain that shoots down your legs. °· You have chest pain. °· You have trouble breathing. °· You are short of breath. °· You feel dizzy or you pass   out (faint).  Your vision changes or you cannot talk as you normally do. Summary  Rest for 24 hrs after the procedure and return slowly to normal activities as told by your doctor.  Do not drive for 24 hours if you were given a medicine to help you relax (sedative).  Take over-the-counter and prescription medicines only as told by your doctor. This information is not intended to replace advice given to you by your health care provider. Make sure you discuss any questions you have with your health care provider. Document Released: 10/07/2009 Document Revised: 06/25/2017 Document Reviewed: 10/13/2016 Elsevier  Patient Education  [place]. Balloon Kyphoplasty, Care After This sheet gives you information about how to care for yourself after your procedure. Your health care provider may also give you more specific instructions. If you have problems or questions, contact your health care provider. What can I expect after the procedure? After your procedure, it is common to have back pain. Follow these instructions at home: Medicines  Take over-the-counter and prescription medicines only as told by your health care provider.  Ask your health care provider if the medicine prescribed to you: ? Requires you to avoid driving or using heavy machinery. ? Can cause constipation. You may need to take steps to prevent or treat constipation, such as:  Drink enough fluid to keep your urine pale yellow.  Take over-the-counter or prescription medicines.  Eat foods that are high in fiber, such as beans, whole grains, and fresh fruits and vegetables.  Limit foods that are high in fat and processed sugars, such as fried or sweet foods. Puncture site care   Follow instructions from your health care provider about how to take care of your puncture site. Make sure you: ? Wash your hands with soap and water before and after you change your bandage (dressing). If soap and water are not available, use hand sanitizer. ? Change your dressing as told by your health care provider. ? Leave skin glue or adhesive strips in place. These skin closures may need to be in place for 2 weeks or longer. If adhesive strip edges start to loosen and curl up, you may trim the loose edges. Do not remove adhesive strips completely unless your health care provider tells you to do that.  Check your puncture site every day for signs of infection. Watch for: ? Redness, swelling, or pain. ? Fluid or blood. ? Warmth. ? Pus or a bad smell.  Keep your dressing dry until your health care provider says that it can be  removed. Managing pain, stiffness, and swelling   If directed, put ice on the painful area. ? Put ice in a plastic bag. ? Place a towel between your skin and the bag. ? Leave the ice on for 20 minutes, 2-3 times a day. Activity  Rest your back and avoid intense physical activity for as long as told by your health care provider.  Avoid bending, lifting, or twisting your back for as long as told by your health care provider.  Return to your normal activities as told by your health care provider. Ask your health care provider what activities are safe for you.  Do not lift anything that is heavier than 5 lb (2.2 kg). You may need to avoid heavy lifting for several weeks. General instructions  Do not use any products that contain nicotine or tobacco, such as cigarettes, e-cigarettes, and chewing tobacco. These can delay bone healing. If you need help quitting, ask  your health care provider.  Do not drive for 24 hours if you were given a sedative during your procedure.  Keep all follow-up visits as told by your health care provider. This is important. Contact a health care provider if:  You have a fever or chills.  You have redness, swelling, or pain at the site of your puncture.  You have fluid, blood, or pus coming from the puncture site.  You have pain that gets worse or does not get better with medicine.  You develop numbness or weakness in any part of your body. Get help right away if:  You have chest pain.  You have difficulty breathing.  You have weakness, numbness, or tingling in your legs.  You cannot control your bladder or bowel movements.  You suddenly become weak or numb on one side of your body.  You become very confused.  You have trouble speaking or understanding, or both. Summary  Follow instructions from your health care provider about how to take care of your puncture site.  Take over-the-counter and prescription medicines only as told by your health  care provider.  Rest your back and avoid intense physical activity for as long as told by your health care provider.  Contact a health care provider if you have pain that gets worse or does not get better with medicine.  Keep all follow-up visits as told by your health care provider. This is important. This information is not intended to replace advice given to you by your health care provider. Make sure you discuss any questions you have with your health care provider. Document Released: 04/03/2015 Document Revised: 06/20/2018 Document Reviewed: 06/20/2018 Elsevier Patient Education  [place]. Moderate Conscious Sedation, Adult, Care After These instructions provide you with information about caring for yourself after your procedure. Your health care provider may also give you more specific instructions. Your treatment has been planned according to current medical practices, but problems sometimes occur. Call your health care provider if you have any problems or questions after your procedure. What can I expect after the procedure? After your procedure, it is common:  To feel sleepy for several hours.  To feel clumsy and have poor balance for several hours.  To have poor judgment for several hours.  To vomit if you eat too soon. Follow these instructions at home: For at least 24 hours after the procedure:   Do not: ? Participate in activities where you could fall or become injured. ? Drive. ? Use heavy machinery. ? Drink alcohol. ? Take sleeping pills or medicines that cause drowsiness. ? Make important decisions or sign legal documents. ? Take care of children on your own.  Rest. Eating and drinking  Follow the diet recommended by your health care provider.  If you vomit: ? Drink water, juice, or soup when you can drink without vomiting. ? Make sure you have little or no nausea before eating solid foods. General instructions  Have a responsible adult stay with  you until you are awake and alert.  Take over-the-counter and prescription medicines only as told by your health care provider.  If you smoke, do not smoke without supervision.  Keep all follow-up visits as told by your health care provider. This is important. Contact a health care provider if:  You keep feeling nauseous or you keep vomiting.  You feel light-headed.  You develop a rash.  You have a fever. Get help right away if:  You have trouble breathing. This information  is not intended to replace advice given to you by your health care provider. Make sure you discuss any questions you have with your health care provider. Document Released: 05/03/2013 Document Revised: 06/25/2017 Document Reviewed: 11/02/2015 Elsevier Patient Education  2020 [employer].

## 2019-03-03 ENCOUNTER — Other Ambulatory Visit: Payer: Self-pay | Admitting: Radiology

## 2019-03-03 DIAGNOSIS — G8929 Other chronic pain: Secondary | ICD-10-CM | POA: Diagnosis not present

## 2019-03-03 DIAGNOSIS — G4700 Insomnia, unspecified: Secondary | ICD-10-CM | POA: Diagnosis not present

## 2019-03-03 DIAGNOSIS — I10 Essential (primary) hypertension: Secondary | ICD-10-CM | POA: Diagnosis not present

## 2019-03-04 DIAGNOSIS — G8929 Other chronic pain: Secondary | ICD-10-CM | POA: Diagnosis not present

## 2019-03-04 DIAGNOSIS — I10 Essential (primary) hypertension: Secondary | ICD-10-CM | POA: Diagnosis not present

## 2019-03-04 DIAGNOSIS — G4700 Insomnia, unspecified: Secondary | ICD-10-CM | POA: Diagnosis not present

## 2019-03-06 ENCOUNTER — Ambulatory Visit (HOSPITAL_COMMUNITY)
Admission: RE | Admit: 2019-03-06 | Discharge: 2019-03-06 | Disposition: A | Discharge status: Home / Self Care | Payer: Medicaid Other | Source: Ambulatory Visit | Attending: Interventional Radiology | Admitting: Interventional Radiology

## 2019-03-06 ENCOUNTER — Other Ambulatory Visit: Payer: Self-pay

## 2019-03-06 ENCOUNTER — Encounter (HOSPITAL_COMMUNITY): Payer: Self-pay

## 2019-03-06 ENCOUNTER — Other Ambulatory Visit (HOSPITAL_COMMUNITY): Payer: Self-pay | Admitting: Interventional Radiology

## 2019-03-06 DIAGNOSIS — S22080A Wedge compression fracture of T11-T12 vertebra, initial encounter for closed fracture: Secondary | ICD-10-CM | POA: Diagnosis not present

## 2019-03-06 DIAGNOSIS — F329 Major depressive disorder, single episode, unspecified: Secondary | ICD-10-CM | POA: Diagnosis not present

## 2019-03-06 DIAGNOSIS — I480 Paroxysmal atrial fibrillation: Secondary | ICD-10-CM | POA: Insufficient documentation

## 2019-03-06 DIAGNOSIS — Z87891 Personal history of nicotine dependence: Secondary | ICD-10-CM | POA: Insufficient documentation

## 2019-03-06 DIAGNOSIS — G8929 Other chronic pain: Secondary | ICD-10-CM | POA: Diagnosis not present

## 2019-03-06 DIAGNOSIS — Z8673 Personal history of transient ischemic attack (TIA), and cerebral infarction without residual deficits: Secondary | ICD-10-CM | POA: Insufficient documentation

## 2019-03-06 DIAGNOSIS — S32010A Wedge compression fracture of first lumbar vertebra, initial encounter for closed fracture: Secondary | ICD-10-CM

## 2019-03-06 DIAGNOSIS — M4855XA Collapsed vertebra, not elsewhere classified, thoracolumbar region, initial encounter for fracture: Secondary | ICD-10-CM | POA: Diagnosis not present

## 2019-03-06 DIAGNOSIS — Z7901 Long term (current) use of anticoagulants: Secondary | ICD-10-CM | POA: Diagnosis not present

## 2019-03-06 DIAGNOSIS — F419 Anxiety disorder, unspecified: Secondary | ICD-10-CM | POA: Insufficient documentation

## 2019-03-06 DIAGNOSIS — I10 Essential (primary) hypertension: Secondary | ICD-10-CM | POA: Insufficient documentation

## 2019-03-06 DIAGNOSIS — K219 Gastro-esophageal reflux disease without esophagitis: Secondary | ICD-10-CM | POA: Diagnosis not present

## 2019-03-06 DIAGNOSIS — E785 Hyperlipidemia, unspecified: Secondary | ICD-10-CM | POA: Insufficient documentation

## 2019-03-06 DIAGNOSIS — Z8546 Personal history of malignant neoplasm of prostate: Secondary | ICD-10-CM | POA: Diagnosis not present

## 2019-03-06 DIAGNOSIS — G4700 Insomnia, unspecified: Secondary | ICD-10-CM | POA: Diagnosis not present

## 2019-03-06 DIAGNOSIS — M8580 Other specified disorders of bone density and structure, unspecified site: Secondary | ICD-10-CM | POA: Diagnosis not present

## 2019-03-06 DIAGNOSIS — Z79899 Other long term (current) drug therapy: Secondary | ICD-10-CM | POA: Insufficient documentation

## 2019-03-06 HISTORY — PX: IR KYPHO THORACIC WITH BONE BIOPSY: IMG5518

## 2019-03-06 HISTORY — PX: IR KYPHO EA ADDL LEVEL THORACIC OR LUMBAR: IMG5520

## 2019-03-06 MED ORDER — CEFAZOLIN SODIUM-DEXTROSE 2-4 GM/100ML-% IV SOLN
INTRAVENOUS | Status: AC
  Filled 2019-03-06: qty 100

## 2019-03-06 MED ORDER — HYDROMORPHONE HCL 1 MG/ML IJ SOLN
INTRAMUSCULAR | Status: AC
  Filled 2019-03-06: qty 1

## 2019-03-06 MED ORDER — MIDAZOLAM HCL 2 MG/2ML IJ SOLN
INTRAMUSCULAR | Status: AC
  Filled 2019-03-06: qty 2

## 2019-03-06 MED ORDER — BUPIVACAINE HCL 0.5 % IJ SOLN
INTRAMUSCULAR | Status: AC | PRN
  Administered 2019-03-06: 20 mL

## 2019-03-06 MED ORDER — HYDROCODONE-ACETAMINOPHEN 5-325 MG PO TABS
1.0000 | ORAL_TABLET | Freq: Once | ORAL | Status: AC
  Administered 2019-03-06: 1 via ORAL
  Filled 2019-03-06: qty 1

## 2019-03-06 MED ORDER — IOHEXOL 300 MG/ML  SOLN
50.0000 mL | Freq: Once | INTRAMUSCULAR | Status: AC | PRN
  Administered 2019-03-06: 15 mL

## 2019-03-06 MED ORDER — BUPIVACAINE HCL (PF) 0.5 % IJ SOLN
INTRAMUSCULAR | Status: AC
  Filled 2019-03-06: qty 30

## 2019-03-06 MED ORDER — FENTANYL CITRATE (PF) 100 MCG/2ML IJ SOLN
INTRAMUSCULAR | Status: AC
  Filled 2019-03-06: qty 2

## 2019-03-06 MED ORDER — MIDAZOLAM HCL 2 MG/2ML IJ SOLN
INTRAMUSCULAR | Status: AC | PRN
  Administered 2019-03-06 (×2): 1 mg via INTRAVENOUS

## 2019-03-06 MED ORDER — HYDROMORPHONE HCL 1 MG/ML IJ SOLN
INTRAMUSCULAR | Status: AC | PRN
  Administered 2019-03-06: 1 mg via INTRAVENOUS

## 2019-03-06 MED ORDER — SODIUM CHLORIDE 0.9 % IV SOLN: INTRAVENOUS | Status: AC

## 2019-03-06 MED ORDER — CEFAZOLIN SODIUM-DEXTROSE 2-4 GM/100ML-% IV SOLN
2.0000 g | INTRAVENOUS | Status: AC
  Administered 2019-03-06: 2 g via INTRAVENOUS

## 2019-03-06 MED ORDER — FENTANYL CITRATE (PF) 100 MCG/2ML IJ SOLN
INTRAMUSCULAR | Status: AC | PRN
  Administered 2019-03-06 (×4): 25 ug via INTRAVENOUS

## 2019-03-06 MED ORDER — TOBRAMYCIN SULFATE 1.2 G IJ SOLR
INTRAMUSCULAR | Status: AC
  Administered 2019-03-06: 15:00:00
  Filled 2019-03-06: qty 1.2

## 2019-03-06 NOTE — Progress Notes (Signed)
Patient tolerated ambulation

## 2019-03-06 NOTE — Discharge Instructions (Signed)
1. No stooping,bending or lifting more than 10 LB s for 2 weeks. 2. Use walker to ambulate for 2 weeks. 3.No driving for 2 weeks. 15. Check with referring MD for biopsy report in next 48 hrs   Balloon Kyphoplasty, Care After This sheet gives you information about how to care for yourself after your procedure. Your health care provider may also give you more specific instructions. If you have problems or questions, contact your health care provider. What can I expect after the procedure? After your procedure, it is common to have back pain. Follow these instructions at home: Medicines  Take over-the-counter and prescription medicines only as told by your health care provider.  Ask your health care provider if the medicine prescribed to you: ? Requires you to avoid driving or using heavy machinery. ? Can cause constipation. You may need to take steps to prevent or treat constipation, such as:  Drink enough fluid to keep your urine pale yellow.  Take over-the-counter or prescription medicines.  Eat foods that are high in fiber, such as beans, whole grains, and fresh fruits and vegetables.  Limit foods that are high in fat and processed sugars, such as fried or sweet foods. Puncture site care   Follow instructions from your health care provider about how to take care of your puncture site. Make sure you: ? Wash your hands with soap and water before and after you change your bandage (dressing). If soap and water are not available, use hand sanitizer. ? Change your dressing as told by your health care provider. ? Leave skin glue or adhesive strips in place. These skin closures may need to be in place for 2 weeks or longer. If adhesive strip edges start to loosen and curl up, you may trim the loose edges. Do not remove adhesive strips completely unless your health care provider tells you to do that.  Check your puncture site every day for signs of infection. Watch for: ? Redness,  swelling, or pain. ? Fluid or blood. ? Warmth. ? Pus or a bad smell.  Keep your dressing dry until your health care provider says that it can be removed. Managing pain, stiffness, and swelling   If directed, put ice on the painful area. ? Put ice in a plastic bag. ? Place a towel between your skin and the bag. ? Leave the ice on for 20 minutes, 2-3 times a day. Activity  Rest your back and avoid intense physical activity for as long as told by your health care provider.  Avoid bending, lifting, or twisting your back for as long as told by your health care provider.  Return to your normal activities as told by your health care provider. Ask your health care provider what activities are safe for you.  Do not lift anything that is heavier than 5 lb (2.2 kg). You may need to avoid heavy lifting for several weeks. General instructions  Do not use any products that contain nicotine or tobacco, such as cigarettes, e-cigarettes, and chewing tobacco. These can delay bone healing. If you need help quitting, ask your health care provider.  Do not drive for 24 hours if you were given a sedative during your procedure.  Keep all follow-up visits as told by your health care provider. This is important. Contact a health care provider if:  You have a fever or chills.  You have redness, swelling, or pain at the site of your puncture.  You have fluid, blood, or pus coming from  the puncture site.  You have pain that gets worse or does not get better with medicine.  You develop numbness or weakness in any part of your body. Get help right away if:  You have chest pain.  You have difficulty breathing.  You have weakness, numbness, or tingling in your legs.  You cannot control your bladder or bowel movements.  You suddenly become weak or numb on one side of your body.  You become very confused.  You have trouble speaking or understanding, or both. Summary  Follow instructions from  your health care provider about how to take care of your puncture site.  Take over-the-counter and prescription medicines only as told by your health care provider.  Rest your back and avoid intense physical activity for as long as told by your health care provider.  Contact a health care provider if you have pain that gets worse or does not get better with medicine.  Keep all follow-up visits as told by your health care provider. This is important. This information is not intended to replace advice given to you by your health care provider. Make sure you discuss any questions you have with your health care provider. Document Released: 04/03/2015 Document Revised: 06/20/2018 Document Reviewed: 06/20/2018 Elsevier Patient Education  2020 [employer].
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
## 2019-03-06 NOTE — Procedures (Signed)
S/P L1 and T 12  KP

## 2019-03-07 DIAGNOSIS — G8929 Other chronic pain: Secondary | ICD-10-CM | POA: Diagnosis not present

## 2019-03-07 DIAGNOSIS — I10 Essential (primary) hypertension: Secondary | ICD-10-CM | POA: Diagnosis not present

## 2019-03-07 DIAGNOSIS — G4700 Insomnia, unspecified: Secondary | ICD-10-CM | POA: Diagnosis not present

## 2019-03-08 DIAGNOSIS — G4700 Insomnia, unspecified: Secondary | ICD-10-CM | POA: Diagnosis not present

## 2019-03-08 DIAGNOSIS — G8929 Other chronic pain: Secondary | ICD-10-CM | POA: Diagnosis not present

## 2019-03-08 DIAGNOSIS — I10 Essential (primary) hypertension: Secondary | ICD-10-CM | POA: Diagnosis not present

## 2019-03-09 ENCOUNTER — Encounter (HOSPITAL_COMMUNITY): Payer: Self-pay | Admitting: Interventional Radiology

## 2019-03-10 DIAGNOSIS — G8929 Other chronic pain: Secondary | ICD-10-CM | POA: Diagnosis not present

## 2019-03-10 DIAGNOSIS — G4700 Insomnia, unspecified: Secondary | ICD-10-CM | POA: Diagnosis not present

## 2019-03-10 DIAGNOSIS — I10 Essential (primary) hypertension: Secondary | ICD-10-CM | POA: Diagnosis not present

## 2019-03-18 DIAGNOSIS — G4700 Insomnia, unspecified: Secondary | ICD-10-CM | POA: Diagnosis not present

## 2019-03-18 DIAGNOSIS — I10 Essential (primary) hypertension: Secondary | ICD-10-CM | POA: Diagnosis not present

## 2019-03-18 DIAGNOSIS — G8929 Other chronic pain: Secondary | ICD-10-CM | POA: Diagnosis not present
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
## 2019-03-20 ENCOUNTER — Encounter: Payer: Self-pay | Admitting: Cardiology

## 2019-03-20 ENCOUNTER — Telehealth (INDEPENDENT_AMBULATORY_CARE_PROVIDER_SITE_OTHER): Payer: Medicaid Other | Admitting: Cardiology

## 2019-03-20 VITALS — BP 161/80 | HR 75 | Ht 75.0 in | Wt 260.0 lb

## 2019-03-20 DIAGNOSIS — I480 Paroxysmal atrial fibrillation: Secondary | ICD-10-CM

## 2019-03-20 DIAGNOSIS — Z720 Tobacco use: Secondary | ICD-10-CM

## 2019-03-20 DIAGNOSIS — I6523 Occlusion and stenosis of bilateral carotid arteries: Secondary | ICD-10-CM | POA: Diagnosis not present

## 2019-03-20 DIAGNOSIS — I10 Essential (primary) hypertension: Secondary | ICD-10-CM | POA: Diagnosis not present

## 2019-03-20 NOTE — Patient Instructions (Signed)

## 2019-03-21 DIAGNOSIS — G4700 Insomnia, unspecified: Secondary | ICD-10-CM | POA: Diagnosis not present

## 2019-03-21 DIAGNOSIS — G8929 Other chronic pain: Secondary | ICD-10-CM | POA: Diagnosis not present

## 2019-03-21 DIAGNOSIS — I10 Essential (primary) hypertension: Secondary | ICD-10-CM | POA: Diagnosis not present

## 2019-03-24 DIAGNOSIS — G8929 Other chronic pain: Secondary | ICD-10-CM | POA: Diagnosis not present

## 2019-03-24 DIAGNOSIS — G4700 Insomnia, unspecified: Secondary | ICD-10-CM | POA: Diagnosis not present

## 2019-03-24 DIAGNOSIS — I10 Essential (primary) hypertension: Secondary | ICD-10-CM | POA: Diagnosis not present

## 2019-03-25 DIAGNOSIS — G4700 Insomnia, unspecified: Secondary | ICD-10-CM | POA: Diagnosis not present

## 2019-03-25 DIAGNOSIS — I10 Essential (primary) hypertension: Secondary | ICD-10-CM | POA: Diagnosis not present

## 2019-03-25 DIAGNOSIS — G8929 Other chronic pain: Secondary | ICD-10-CM | POA: Diagnosis not present

## 2019-03-27 DIAGNOSIS — I10 Essential (primary) hypertension: Secondary | ICD-10-CM | POA: Diagnosis not present

## 2019-03-27 DIAGNOSIS — G8929 Other chronic pain: Secondary | ICD-10-CM | POA: Diagnosis not present

## 2019-03-27 DIAGNOSIS — G4700 Insomnia, unspecified: Secondary | ICD-10-CM | POA: Diagnosis not present

## 2019-03-28 DIAGNOSIS — I10 Essential (primary) hypertension: Secondary | ICD-10-CM | POA: Diagnosis not present

## 2019-03-28 DIAGNOSIS — G4700 Insomnia, unspecified: Secondary | ICD-10-CM | POA: Diagnosis not present

## 2019-03-28 DIAGNOSIS — G8929 Other chronic pain: Secondary | ICD-10-CM | POA: Diagnosis not present

## 2019-03-29 DIAGNOSIS — G4700 Insomnia, unspecified: Secondary | ICD-10-CM | POA: Diagnosis not present

## 2019-03-29 DIAGNOSIS — I10 Essential (primary) hypertension: Secondary | ICD-10-CM | POA: Diagnosis not present

## 2019-03-29 DIAGNOSIS — G8929 Other chronic pain: Secondary | ICD-10-CM | POA: Diagnosis not present

## 2019-03-30 DIAGNOSIS — Z7689 Persons encountering health services in other specified circumstances: Secondary | ICD-10-CM | POA: Diagnosis not present

## 2019-03-30 DIAGNOSIS — M542 Cervicalgia: Secondary | ICD-10-CM | POA: Diagnosis not present

## 2019-03-30 DIAGNOSIS — M961 Postlaminectomy syndrome, not elsewhere classified: Secondary | ICD-10-CM | POA: Diagnosis not present

## 2019-03-30 DIAGNOSIS — G8929 Other chronic pain: Secondary | ICD-10-CM | POA: Diagnosis not present

## 2019-03-30 DIAGNOSIS — M546 Pain in thoracic spine: Secondary | ICD-10-CM | POA: Diagnosis not present

## 2019-03-30 DIAGNOSIS — M545 Low back pain: Secondary | ICD-10-CM | POA: Diagnosis not present

## 2019-03-31 DIAGNOSIS — G8929 Other chronic pain: Secondary | ICD-10-CM | POA: Diagnosis not present

## 2019-03-31 DIAGNOSIS — G4700 Insomnia, unspecified: Secondary | ICD-10-CM | POA: Diagnosis not present

## 2019-03-31 DIAGNOSIS — I10 Essential (primary) hypertension: Secondary | ICD-10-CM | POA: Diagnosis not present

## 2019-04-04 DIAGNOSIS — G8929 Other chronic pain: Secondary | ICD-10-CM | POA: Diagnosis not present

## 2019-04-04 DIAGNOSIS — M545 Low back pain: Secondary | ICD-10-CM | POA: Diagnosis not present

## 2019-04-04 DIAGNOSIS — M542 Cervicalgia: Secondary | ICD-10-CM | POA: Diagnosis not present

## 2019-04-04 DIAGNOSIS — M546 Pain in thoracic spine: Secondary | ICD-10-CM | POA: Diagnosis not present

## 2019-04-04 DIAGNOSIS — M961 Postlaminectomy syndrome, not elsewhere classified: Secondary | ICD-10-CM | POA: Diagnosis not present

## 2019-04-06 DIAGNOSIS — I10 Essential (primary) hypertension: Secondary | ICD-10-CM | POA: Diagnosis not present

## 2019-04-06 DIAGNOSIS — G8929 Other chronic pain: Secondary | ICD-10-CM | POA: Diagnosis not present

## 2019-04-06 DIAGNOSIS — G4700 Insomnia, unspecified: Secondary | ICD-10-CM | POA: Diagnosis not present

## 2019-04-07 DIAGNOSIS — I10 Essential (primary) hypertension: Secondary | ICD-10-CM | POA: Diagnosis not present

## 2019-04-07 DIAGNOSIS — G4700 Insomnia, unspecified: Secondary | ICD-10-CM | POA: Diagnosis not present

## 2019-04-07 DIAGNOSIS — G8929 Other chronic pain: Secondary | ICD-10-CM | POA: Diagnosis not present

## 2019-04-10 DIAGNOSIS — I10 Essential (primary) hypertension: Secondary | ICD-10-CM | POA: Diagnosis not present

## 2019-04-10 DIAGNOSIS — G4700 Insomnia, unspecified: Secondary | ICD-10-CM | POA: Diagnosis not present

## 2019-04-10 DIAGNOSIS — G8929 Other chronic pain: Secondary | ICD-10-CM | POA: Diagnosis not present

## 2019-04-11 DIAGNOSIS — I10 Essential (primary) hypertension: Secondary | ICD-10-CM | POA: Diagnosis not present

## 2019-04-11 DIAGNOSIS — G8929 Other chronic pain: Secondary | ICD-10-CM | POA: Diagnosis not present

## 2019-04-11 DIAGNOSIS — G4700 Insomnia, unspecified: Secondary | ICD-10-CM | POA: Diagnosis not present

## 2019-04-14 DIAGNOSIS — G8929 Other chronic pain: Secondary | ICD-10-CM | POA: Diagnosis not present

## 2019-04-14 DIAGNOSIS — I10 Essential (primary) hypertension: Secondary | ICD-10-CM | POA: Diagnosis not present

## 2019-04-14 DIAGNOSIS — G4700 Insomnia, unspecified: Secondary | ICD-10-CM | POA: Diagnosis not present

## 2019-04-15 DIAGNOSIS — G8929 Other chronic pain: Secondary | ICD-10-CM | POA: Diagnosis not present

## 2019-04-15 DIAGNOSIS — G4700 Insomnia, unspecified: Secondary | ICD-10-CM | POA: Diagnosis not present

## 2019-04-15 DIAGNOSIS — I10 Essential (primary) hypertension: Secondary | ICD-10-CM | POA: Diagnosis not present

## 2019-04-17 DIAGNOSIS — Z7689 Persons encountering health services in other specified circumstances: Secondary | ICD-10-CM | POA: Diagnosis not present

## 2019-04-18 DIAGNOSIS — G8929 Other chronic pain: Secondary | ICD-10-CM | POA: Diagnosis not present

## 2019-04-18 DIAGNOSIS — G4700 Insomnia, unspecified: Secondary | ICD-10-CM | POA: Diagnosis not present

## 2019-04-18 DIAGNOSIS — I10 Essential (primary) hypertension: Secondary | ICD-10-CM | POA: Diagnosis not present

## 2019-04-19 DIAGNOSIS — G8929 Other chronic pain: Secondary | ICD-10-CM | POA: Diagnosis not present

## 2019-04-19 DIAGNOSIS — G4700 Insomnia, unspecified: Secondary | ICD-10-CM | POA: Diagnosis not present

## 2019-04-19 DIAGNOSIS — I10 Essential (primary) hypertension: Secondary | ICD-10-CM | POA: Diagnosis not present

## 2019-04-20 DIAGNOSIS — G8929 Other chronic pain: Secondary | ICD-10-CM | POA: Diagnosis not present

## 2019-04-20 DIAGNOSIS — I10 Essential (primary) hypertension: Secondary | ICD-10-CM | POA: Diagnosis not present

## 2019-04-20 DIAGNOSIS — G4700 Insomnia, unspecified: Secondary | ICD-10-CM | POA: Diagnosis not present

## 2019-04-22 DIAGNOSIS — I10 Essential (primary) hypertension: Secondary | ICD-10-CM | POA: Diagnosis not present

## 2019-04-22 DIAGNOSIS — G8929 Other chronic pain: Secondary | ICD-10-CM | POA: Diagnosis not present

## 2019-04-22 DIAGNOSIS — G4700 Insomnia, unspecified: Secondary | ICD-10-CM | POA: Diagnosis not present

## 2019-04-26 DIAGNOSIS — G4700 Insomnia, unspecified: Secondary | ICD-10-CM | POA: Diagnosis not present

## 2019-04-26 DIAGNOSIS — I10 Essential (primary) hypertension: Secondary | ICD-10-CM | POA: Diagnosis not present

## 2019-04-26 DIAGNOSIS — G8929 Other chronic pain: Secondary | ICD-10-CM | POA: Diagnosis not present

## 2019-05-01 ENCOUNTER — Other Ambulatory Visit: Payer: Self-pay | Admitting: Family Medicine

## 2019-05-01 DIAGNOSIS — I6522 Occlusion and stenosis of left carotid artery: Secondary | ICD-10-CM

## 2019-05-02 DIAGNOSIS — G4700 Insomnia, unspecified: Secondary | ICD-10-CM | POA: Diagnosis not present

## 2019-05-02 DIAGNOSIS — I10 Essential (primary) hypertension: Secondary | ICD-10-CM | POA: Diagnosis not present

## 2019-05-02 DIAGNOSIS — G8929 Other chronic pain: Secondary | ICD-10-CM | POA: Diagnosis not present

## 2019-05-04 DIAGNOSIS — I10 Essential (primary) hypertension: Secondary | ICD-10-CM | POA: Diagnosis not present

## 2019-05-04 DIAGNOSIS — G4700 Insomnia, unspecified: Secondary | ICD-10-CM | POA: Diagnosis not present

## 2019-05-04 DIAGNOSIS — G8929 Other chronic pain: Secondary | ICD-10-CM | POA: Diagnosis not present

## 2019-05-05 DIAGNOSIS — G8929 Other chronic pain: Secondary | ICD-10-CM | POA: Diagnosis not present

## 2019-05-05 DIAGNOSIS — G4700 Insomnia, unspecified: Secondary | ICD-10-CM | POA: Diagnosis not present

## 2019-05-05 DIAGNOSIS — I10 Essential (primary) hypertension: Secondary | ICD-10-CM | POA: Diagnosis not present

## 2019-05-06 DIAGNOSIS — G4700 Insomnia, unspecified: Secondary | ICD-10-CM | POA: Diagnosis not present

## 2019-05-06 DIAGNOSIS — G8929 Other chronic pain: Secondary | ICD-10-CM | POA: Diagnosis not present

## 2019-05-06 DIAGNOSIS — I10 Essential (primary) hypertension: Secondary | ICD-10-CM | POA: Diagnosis not present

## 2019-05-07 DIAGNOSIS — G4700 Insomnia, unspecified: Secondary | ICD-10-CM | POA: Diagnosis not present

## 2019-05-07 DIAGNOSIS — I10 Essential (primary) hypertension: Secondary | ICD-10-CM | POA: Diagnosis not present

## 2019-05-07 DIAGNOSIS — G8929 Other chronic pain: Secondary | ICD-10-CM | POA: Diagnosis not present

## 2019-05-08 DIAGNOSIS — G4700 Insomnia, unspecified: Secondary | ICD-10-CM | POA: Diagnosis not present

## 2019-05-08 DIAGNOSIS — G8929 Other chronic pain: Secondary | ICD-10-CM | POA: Diagnosis not present

## 2019-05-08 DIAGNOSIS — I10 Essential (primary) hypertension: Secondary | ICD-10-CM | POA: Diagnosis not present

## 2019-05-10 DIAGNOSIS — G8929 Other chronic pain: Secondary | ICD-10-CM | POA: Diagnosis not present

## 2019-05-10 DIAGNOSIS — I10 Essential (primary) hypertension: Secondary | ICD-10-CM | POA: Diagnosis not present

## 2019-05-10 DIAGNOSIS — G4700 Insomnia, unspecified: Secondary | ICD-10-CM | POA: Diagnosis not present

## 2019-05-11 DIAGNOSIS — G4700 Insomnia, unspecified: Secondary | ICD-10-CM | POA: Diagnosis not present

## 2019-05-11 DIAGNOSIS — G8929 Other chronic pain: Secondary | ICD-10-CM | POA: Diagnosis not present

## 2019-05-11 DIAGNOSIS — I10 Essential (primary) hypertension: Secondary | ICD-10-CM | POA: Diagnosis not present

## 2019-05-12 DIAGNOSIS — G4700 Insomnia, unspecified: Secondary | ICD-10-CM | POA: Diagnosis not present

## 2019-05-12 DIAGNOSIS — I10 Essential (primary) hypertension: Secondary | ICD-10-CM | POA: Diagnosis not present

## 2019-05-12 DIAGNOSIS — G8929 Other chronic pain: Secondary | ICD-10-CM | POA: Diagnosis not present

## 2019-05-13 DIAGNOSIS — I10 Essential (primary) hypertension: Secondary | ICD-10-CM | POA: Diagnosis not present

## 2019-05-13 DIAGNOSIS — G8929 Other chronic pain: Secondary | ICD-10-CM | POA: Diagnosis not present

## 2019-05-13 DIAGNOSIS — G4700 Insomnia, unspecified: Secondary | ICD-10-CM | POA: Diagnosis not present

## 2019-05-14 DIAGNOSIS — G8929 Other chronic pain: Secondary | ICD-10-CM | POA: Diagnosis not present

## 2019-05-14 DIAGNOSIS — I10 Essential (primary) hypertension: Secondary | ICD-10-CM | POA: Diagnosis not present

## 2019-05-14 DIAGNOSIS — G4700 Insomnia, unspecified: Secondary | ICD-10-CM | POA: Diagnosis not present

## 2019-05-15 DIAGNOSIS — G8929 Other chronic pain: Secondary | ICD-10-CM | POA: Diagnosis not present

## 2019-05-15 DIAGNOSIS — G4700 Insomnia, unspecified: Secondary | ICD-10-CM | POA: Diagnosis not present

## 2019-05-15 DIAGNOSIS — I10 Essential (primary) hypertension: Secondary | ICD-10-CM | POA: Diagnosis not present

## 2019-05-16 DIAGNOSIS — G4700 Insomnia, unspecified: Secondary | ICD-10-CM | POA: Diagnosis not present

## 2019-05-16 DIAGNOSIS — I10 Essential (primary) hypertension: Secondary | ICD-10-CM | POA: Diagnosis not present

## 2019-05-16 DIAGNOSIS — G8929 Other chronic pain: Secondary | ICD-10-CM | POA: Diagnosis not present

## 2019-05-17 DIAGNOSIS — G4700 Insomnia, unspecified: Secondary | ICD-10-CM | POA: Diagnosis not present

## 2019-05-17 DIAGNOSIS — I10 Essential (primary) hypertension: Secondary | ICD-10-CM | POA: Diagnosis not present

## 2019-05-17 DIAGNOSIS — G8929 Other chronic pain: Secondary | ICD-10-CM | POA: Diagnosis not present

## 2019-05-21 ENCOUNTER — Other Ambulatory Visit: Payer: Self-pay | Admitting: Family Medicine

## 2019-05-23 DIAGNOSIS — G8929 Other chronic pain: Secondary | ICD-10-CM | POA: Diagnosis not present

## 2019-05-23 DIAGNOSIS — G4700 Insomnia, unspecified: Secondary | ICD-10-CM | POA: Diagnosis not present

## 2019-05-23 DIAGNOSIS — I10 Essential (primary) hypertension: Secondary | ICD-10-CM | POA: Diagnosis not present

## 2019-05-25 DIAGNOSIS — G4700 Insomnia, unspecified: Secondary | ICD-10-CM | POA: Diagnosis not present

## 2019-05-25 DIAGNOSIS — I10 Essential (primary) hypertension: Secondary | ICD-10-CM | POA: Diagnosis not present

## 2019-05-25 DIAGNOSIS — G8929 Other chronic pain: Secondary | ICD-10-CM | POA: Diagnosis not present

## 2019-05-26 DIAGNOSIS — G8929 Other chronic pain: Secondary | ICD-10-CM | POA: Diagnosis not present

## 2019-05-26 DIAGNOSIS — I10 Essential (primary) hypertension: Secondary | ICD-10-CM | POA: Diagnosis not present

## 2019-05-26 DIAGNOSIS — G4700 Insomnia, unspecified: Secondary | ICD-10-CM | POA: Diagnosis not present

## 2019-05-27 DIAGNOSIS — G8929 Other chronic pain: Secondary | ICD-10-CM | POA: Diagnosis not present

## 2019-05-27 DIAGNOSIS — I10 Essential (primary) hypertension: Secondary | ICD-10-CM | POA: Diagnosis not present

## 2019-05-27 DIAGNOSIS — G4700 Insomnia, unspecified: Secondary | ICD-10-CM | POA: Diagnosis not present

## 2019-05-29 DIAGNOSIS — Z7689 Persons encountering health services in other specified circumstances: Secondary | ICD-10-CM | POA: Diagnosis not present

## 2019-05-29 DIAGNOSIS — G8929 Other chronic pain: Secondary | ICD-10-CM | POA: Diagnosis not present

## 2019-05-29 DIAGNOSIS — M961 Postlaminectomy syndrome, not elsewhere classified: Secondary | ICD-10-CM | POA: Diagnosis not present

## 2019-05-29 DIAGNOSIS — M546 Pain in thoracic spine: Secondary | ICD-10-CM | POA: Diagnosis not present

## 2019-05-29 DIAGNOSIS — I10 Essential (primary) hypertension: Secondary | ICD-10-CM | POA: Diagnosis not present

## 2019-05-29 DIAGNOSIS — M542 Cervicalgia: Secondary | ICD-10-CM | POA: Diagnosis not present

## 2019-05-29 DIAGNOSIS — G4700 Insomnia, unspecified: Secondary | ICD-10-CM | POA: Diagnosis not present

## 2019-05-29 DIAGNOSIS — M545 Low back pain: Secondary | ICD-10-CM | POA: Diagnosis not present

## 2019-05-30 DIAGNOSIS — G4700 Insomnia, unspecified: Secondary | ICD-10-CM | POA: Diagnosis not present

## 2019-05-30 DIAGNOSIS — G8929 Other chronic pain: Secondary | ICD-10-CM | POA: Diagnosis not present

## 2019-05-30 DIAGNOSIS — I10 Essential (primary) hypertension: Secondary | ICD-10-CM | POA: Diagnosis not present

## 2019-06-05 DIAGNOSIS — G8929 Other chronic pain: Secondary | ICD-10-CM | POA: Diagnosis not present

## 2019-06-06 DIAGNOSIS — G8929 Other chronic pain: Secondary | ICD-10-CM | POA: Diagnosis not present

## 2019-06-07 DIAGNOSIS — G8929 Other chronic pain: Secondary | ICD-10-CM | POA: Diagnosis not present

## 2019-06-08 DIAGNOSIS — G8929 Other chronic pain: Secondary | ICD-10-CM | POA: Diagnosis not present

## 2019-06-09 DIAGNOSIS — G8929 Other chronic pain: Secondary | ICD-10-CM | POA: Diagnosis not present

## 2019-06-10 DIAGNOSIS — G8929 Other chronic pain: Secondary | ICD-10-CM | POA: Diagnosis not present

## 2019-06-11 DIAGNOSIS — G8929 Other chronic pain: Secondary | ICD-10-CM | POA: Diagnosis not present

## 2019-06-12 DIAGNOSIS — G8929 Other chronic pain: Secondary | ICD-10-CM | POA: Diagnosis not present

## 2019-06-13 ENCOUNTER — Other Ambulatory Visit: Payer: Self-pay

## 2019-06-13 DIAGNOSIS — G8929 Other chronic pain: Secondary | ICD-10-CM | POA: Diagnosis not present

## 2019-06-13 DIAGNOSIS — I6521 Occlusion and stenosis of right carotid artery: Secondary | ICD-10-CM

## 2019-06-14 ENCOUNTER — Telehealth (HOSPITAL_COMMUNITY): Payer: Self-pay

## 2019-06-14 DIAGNOSIS — G8929 Other chronic pain: Secondary | ICD-10-CM | POA: Diagnosis not present

## 2019-06-14 NOTE — Telephone Encounter (Signed)

## 2019-06-15 ENCOUNTER — Ambulatory Visit (HOSPITAL_COMMUNITY)
Admission: RE | Admit: 2019-06-15 | Discharge: 2019-06-15 | Disposition: A | Discharge status: Home / Self Care | Payer: Medicaid Other | Source: Ambulatory Visit | Attending: Family | Admitting: Family

## 2019-06-15 ENCOUNTER — Encounter: Payer: Self-pay | Admitting: Vascular Surgery

## 2019-06-15 ENCOUNTER — Ambulatory Visit (INDEPENDENT_AMBULATORY_CARE_PROVIDER_SITE_OTHER): Payer: Medicaid Other | Admitting: Vascular Surgery

## 2019-06-15 ENCOUNTER — Other Ambulatory Visit: Payer: Self-pay

## 2019-06-15 VITALS — BP 150/90 | HR 57 | Temp 97.8°F | Resp 20 | Ht 75.0 in | Wt 272.5 lb

## 2019-06-15 DIAGNOSIS — I6521 Occlusion and stenosis of right carotid artery: Secondary | ICD-10-CM | POA: Diagnosis not present

## 2019-06-15 DIAGNOSIS — G8929 Other chronic pain: Secondary | ICD-10-CM | POA: Diagnosis not present

## 2019-06-15 DIAGNOSIS — I6522 Occlusion and stenosis of left carotid artery: Secondary | ICD-10-CM

## 2019-06-15 DIAGNOSIS — Z7689 Persons encountering health services in other specified circumstances: Secondary | ICD-10-CM | POA: Diagnosis not present
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
## 2019-06-16 DIAGNOSIS — G8929 Other chronic pain: Secondary | ICD-10-CM | POA: Diagnosis not present

## 2019-06-17 DIAGNOSIS — G8929 Other chronic pain: Secondary | ICD-10-CM | POA: Diagnosis not present

## 2019-06-18 DIAGNOSIS — G8929 Other chronic pain: Secondary | ICD-10-CM | POA: Diagnosis not present

## 2019-06-19 ENCOUNTER — Other Ambulatory Visit: Payer: Self-pay | Admitting: Family Medicine

## 2019-06-28 ENCOUNTER — Other Ambulatory Visit: Payer: Self-pay | Admitting: Family Medicine

## 2019-06-28 NOTE — Telephone Encounter (Signed)
Stacks. NTBS 30 days given 05/30/19

## 2019-06-29 ENCOUNTER — Other Ambulatory Visit: Payer: Self-pay

## 2019-06-29 DIAGNOSIS — I6521 Occlusion and stenosis of right carotid artery: Secondary | ICD-10-CM

## 2019-06-29 DIAGNOSIS — I6522 Occlusion and stenosis of left carotid artery: Secondary | ICD-10-CM

## 2019-07-03 ENCOUNTER — Ambulatory Visit (INDEPENDENT_AMBULATORY_CARE_PROVIDER_SITE_OTHER): Payer: Medicaid Other | Admitting: Family Medicine

## 2019-07-03 ENCOUNTER — Encounter: Payer: Self-pay | Admitting: Family Medicine

## 2019-07-03 DIAGNOSIS — I6522 Occlusion and stenosis of left carotid artery: Secondary | ICD-10-CM | POA: Diagnosis not present

## 2019-07-03 DIAGNOSIS — I951 Orthostatic hypotension: Secondary | ICD-10-CM | POA: Diagnosis not present

## 2019-07-03 DIAGNOSIS — I480 Paroxysmal atrial fibrillation: Secondary | ICD-10-CM | POA: Diagnosis not present

## 2019-07-03 DIAGNOSIS — E785 Hyperlipidemia, unspecified: Secondary | ICD-10-CM | POA: Diagnosis not present

## 2019-07-03 DIAGNOSIS — I10 Essential (primary) hypertension: Secondary | ICD-10-CM

## 2019-07-03 MED ORDER — DOXYCYCLINE HYCLATE 100 MG PO CAPS: 100.0000 mg | ORAL_CAPSULE | Freq: Two times a day (BID) | ORAL | 0 refills | Status: DC

## 2019-07-03 MED ORDER — ATORVASTATIN CALCIUM 40 MG PO TABS: 40.0000 mg | ORAL_TABLET | Freq: Every day | ORAL | 2 refills | Status: DC

## 2019-07-03 MED ORDER — ZOLPIDEM TARTRATE 10 MG PO TABS: 10.0000 mg | ORAL_TABLET | Freq: Every day | ORAL | 1 refills | Status: DC

## 2019-07-03 MED ORDER — TRAZODONE HCL 150 MG PO TABS: 300.0000 mg | ORAL_TABLET | Freq: Every day | ORAL | 3 refills | Status: DC

## 2019-07-03 MED ORDER — METOPROLOL SUCCINATE ER 50 MG PO TB24: 50.0000 mg | ORAL_TABLET | Freq: Every day | ORAL | 1 refills | Status: DC

## 2019-07-03 MED ORDER — DULOXETINE HCL 60 MG PO CPEP: 60.0000 mg | ORAL_CAPSULE | Freq: Every day | ORAL | 3 refills | Status: DC

## 2019-07-03 MED ORDER — OMEPRAZOLE 20 MG PO CPDR: 20.0000 mg | DELAYED_RELEASE_CAPSULE | Freq: Every day | ORAL | 1 refills | Status: DC

## 2019-07-03 NOTE — Progress Notes (Signed)
Subjective:    Patient ID: Calvin Aguirre, male    DOB: 03-Jun-1961, 58 y.o.   MRN: [id number]   HPI: Calvin Aguirre is a 57 y.o. male presenting for Atrial fibrillation follow up. Pt. is treated with rate control and anticoagulation. Pt.  Has occasional palpitations, but he denies rapid rate, chest pain, dyspnea and edema. There has been no bleeding from nose or gums. Pt. has not noticed blood with urine or stool.  Although there is routine bruising easily, it is not excessive.   in for follow-up of elevated cholesterol. Doing well without complaints on current medication. Denies side effects of statin including myalgia and arthralgia and nausea. Currently no chest pain, shortness of breath or other cardiovascular related symptoms noted.  Chronic pain under fair control. Resting in bed a lot. Taking meds as noted on med list from pain clinic  Depression screen [facility] 2/9 05/31/2018 12/02/2017 07/13/2017 04/15/2017 01/14/2017  Decreased Interest 1 0 0 0 0  Down, Depressed, Hopeless 0 0 0 0 0  PHQ - 2 Score 1 0 0 0 0  Altered sleeping - - - - -  Tired, decreased energy - - - - -  Change in appetite - - - - -  Feeling bad or failure about yourself  - - - - -  Trouble concentrating - - - - -  Moving slowly or fidgety/restless - - - - -  Suicidal thoughts - - - - -  PHQ-9 Score - - - - -  Difficult doing work/chores - - - - -  Some recent data might be hidden     Relevant past medical, surgical, family and social history reviewed and updated as indicated.  Interim medical history since our last visit reviewed. Allergies and medications reviewed and updated.  ROS:  Review of Systems  Constitutional: Negative.   HENT: Negative.   Eyes: Negative for visual disturbance.  Respiratory: Negative for cough and shortness of breath.   Cardiovascular: Negative for chest pain and leg swelling.  Gastrointestinal: Negative for abdominal pain, diarrhea, nausea and vomiting.  Genitourinary: Negative  for difficulty urinating.  Musculoskeletal: Negative for arthralgias and myalgias.  Skin: Positive for rash (breaking out in red spots on face and scalp intermittently for months. Crust over then heal.).  Neurological: Positive for dizziness (light headed with first standing. Lasting < 1 minute. No LOC). Negative for headaches.  Psychiatric/Behavioral: Positive for sleep disturbance (doing well as long as he uses the prescribed combination.).     Social History   Tobacco Use  Smoking Status Current Every Day Smoker  . Packs/day: 2.00  . Years: 20.00  . Pack years: 40.00  . Types: Cigarettes  . Start date: 09/22/1976  Smokeless Tobacco Never Used       Objective:     Wt Readings from Last 3 Encounters:  06/15/19 272 lb 8 oz (123.6 kg)  03/20/19 260 lb (117.9 kg)  03/06/19 250 lb (113.4 kg)     Exam deferred. Pt. Harboring due to COVID 19. Phone visit performed.   Assessment & Plan:   1. Essential hypertension   2. Hyperlipidemia, unspecified hyperlipidemia type   3. Paroxysmal atrial fibrillation (HCC)   4. Occlusion of left carotid artery   5. Orthostasis     Meds ordered this encounter  Medications  . atorvastatin (LIPITOR) 40 MG tablet    Sig: Take 1 tablet (40 mg total) by mouth daily.    Dispense:  90 tablet  Refill:  2  . metoprolol succinate (TOPROL-XL) 50 MG 24 hr tablet    Sig: Take 1 tablet (50 mg total) by mouth daily. IMMEDIATELY FOLLOWING A MEAL (Please make 6 mos ckup)    Dispense:  90 tablet    Refill:  1  . traZODone (DESYREL) 150 MG tablet    Sig: Take 2 tablets (300 mg total) by mouth at bedtime.    Dispense:  180 tablet    Refill:  3  . zolpidem (AMBIEN) 10 MG tablet    Sig: Take 1 tablet (10 mg total) by mouth at bedtime.    Dispense:  90 tablet    Refill:  1    This prescription was filled on 11/22/2018. Any refills authorized will be placed on file.  Marland Kitchen omeprazole (PRILOSEC) 20 MG capsule    Sig: Take 1 capsule (20 mg total) by mouth  daily. (Please make 6 mos ckup)    Dispense:  90 capsule    Refill:  1  . DULoxetine (CYMBALTA) 60 MG capsule    Sig: Take 1 capsule (60 mg total) by mouth daily. (Needs to be seen before next refill)    Dispense:  180 capsule    Refill:  3  . doxycycline (VIBRAMYCIN) 100 MG capsule    Sig: Take 1 capsule (100 mg total) by mouth 2 (two) times daily.    Dispense:  20 capsule    Refill:  0    No orders of the defined types were placed in this encounter.     Diagnoses and all orders for this visit:  Essential hypertension  Hyperlipidemia, unspecified hyperlipidemia type  Paroxysmal atrial fibrillation (HCC)  Occlusion of left carotid artery -     atorvastatin (LIPITOR) 40 MG tablet; Take 1 tablet (40 mg total) by mouth daily.  Orthostasis  Other orders -     metoprolol succinate (TOPROL-XL) 50 MG 24 hr tablet; Take 1 tablet (50 mg total) by mouth daily. IMMEDIATELY FOLLOWING A MEAL (Please make 6 mos ckup) -     traZODone (DESYREL) 150 MG tablet; Take 2 tablets (300 mg total) by mouth at bedtime. -     zolpidem (AMBIEN) 10 MG tablet; Take 1 tablet (10 mg total) by mouth at bedtime. -     omeprazole (PRILOSEC) 20 MG capsule; Take 1 capsule (20 mg total) by mouth daily. (Please make 6 mos ckup) -     DULoxetine (CYMBALTA) 60 MG capsule; Take 1 capsule (60 mg total) by mouth daily. (Needs to be seen before next refill) -     doxycycline (VIBRAMYCIN) 100 MG capsule; Take 1 capsule (100 mg total) by mouth 2 (two) times daily.    Virtual Visit via telephone Note  I discussed the limitations, risks, security and privacy concerns of performing an evaluation and management service by telephone and the availability of in person appointments. The patient was identified with two identifiers. Pt.expressed understanding and agreed to proceed. Pt. Is at home. Dr. Livia Snellen is in his office.  Follow Up Instructions:   I discussed the assessment and treatment plan with the patient. The patient  was provided an opportunity to ask questions and all were answered. The patient agreed with the plan and demonstrated an understanding of the instructions.   The patient was advised to call back or seek an in-person evaluation if the symptoms worsen or if the condition fails to improve as anticipated.   Total minutes including chart review and phone contact time: 20  Follow up plan: Return in about 6 months (around 01/01/2020).  Claretta Fraise, MD [place]

## 2019-07-09 DIAGNOSIS — G8929 Other chronic pain: Secondary | ICD-10-CM | POA: Diagnosis not present

## 2019-07-10 DIAGNOSIS — G8929 Other chronic pain: Secondary | ICD-10-CM | POA: Diagnosis not present

## 2019-07-11 DIAGNOSIS — M542 Cervicalgia: Secondary | ICD-10-CM | POA: Diagnosis not present

## 2019-07-11 DIAGNOSIS — G8929 Other chronic pain: Secondary | ICD-10-CM | POA: Diagnosis not present

## 2019-07-11 DIAGNOSIS — M961 Postlaminectomy syndrome, not elsewhere classified: Secondary | ICD-10-CM | POA: Diagnosis not present

## 2019-07-11 DIAGNOSIS — M546 Pain in thoracic spine: Secondary | ICD-10-CM | POA: Diagnosis not present

## 2019-07-11 DIAGNOSIS — M545 Low back pain: Secondary | ICD-10-CM | POA: Diagnosis not present

## 2019-07-12 DIAGNOSIS — G8929 Other chronic pain: Secondary | ICD-10-CM | POA: Diagnosis not present

## 2019-07-13 DIAGNOSIS — G8929 Other chronic pain: Secondary | ICD-10-CM | POA: Diagnosis not present

## 2019-07-14 ENCOUNTER — Telehealth: Payer: Self-pay | Admitting: Family Medicine

## 2019-07-14 DIAGNOSIS — G8929 Other chronic pain: Secondary | ICD-10-CM | POA: Diagnosis not present

## 2019-07-14 NOTE — Telephone Encounter (Signed)
Can we refer back? Please advise

## 2019-07-14 NOTE — Telephone Encounter (Signed)
Patient called stating that he would like to be switched back to [facility] for pain management. Says he is currently going to [facility] and doesn't like that they put him on the minimum pain meds.

## 2019-07-17 DIAGNOSIS — G8929 Other chronic pain: Secondary | ICD-10-CM | POA: Diagnosis not present

## 2019-07-17 NOTE — Telephone Encounter (Signed)
Patient aware and verbalizes understanding. 

## 2019-07-17 NOTE — Telephone Encounter (Signed)
Please contact the patient . HE can try [facility]. Since he has been there before he shouldn't need a referral.

## 2019-07-18 DIAGNOSIS — G8929 Other chronic pain: Secondary | ICD-10-CM | POA: Diagnosis not present

## 2019-07-19 DIAGNOSIS — G8929 Other chronic pain: Secondary | ICD-10-CM | POA: Diagnosis not present

## 2019-07-20 DIAGNOSIS — G8929 Other chronic pain: Secondary | ICD-10-CM | POA: Diagnosis not present

## 2019-07-22 DIAGNOSIS — G8929 Other chronic pain: Secondary | ICD-10-CM | POA: Diagnosis not present

## 2019-07-23 DIAGNOSIS — G8929 Other chronic pain: Secondary | ICD-10-CM | POA: Diagnosis not present

## 2019-07-31 DIAGNOSIS — Z79899 Other long term (current) drug therapy: Secondary | ICD-10-CM | POA: Diagnosis not present

## 2019-07-31 DIAGNOSIS — Z7689 Persons encountering health services in other specified circumstances: Secondary | ICD-10-CM | POA: Diagnosis not present

## 2019-07-31 DIAGNOSIS — G894 Chronic pain syndrome: Secondary | ICD-10-CM | POA: Diagnosis not present

## 2019-07-31 DIAGNOSIS — E559 Vitamin D deficiency, unspecified: Secondary | ICD-10-CM | POA: Diagnosis not present

## 2019-07-31 DIAGNOSIS — M129 Arthropathy, unspecified: Secondary | ICD-10-CM | POA: Diagnosis not present

## 2019-07-31 DIAGNOSIS — M5136 Other intervertebral disc degeneration, lumbar region: Secondary | ICD-10-CM | POA: Diagnosis not present

## 2019-07-31 DIAGNOSIS — G8929 Other chronic pain: Secondary | ICD-10-CM | POA: Diagnosis not present

## 2019-08-01 DIAGNOSIS — G8929 Other chronic pain: Secondary | ICD-10-CM | POA: Diagnosis not present

## 2019-08-02 DIAGNOSIS — G8929 Other chronic pain: Secondary | ICD-10-CM | POA: Diagnosis not present

## 2019-08-03 DIAGNOSIS — G8929 Other chronic pain: Secondary | ICD-10-CM | POA: Diagnosis not present

## 2019-08-04 DIAGNOSIS — G8929 Other chronic pain: Secondary | ICD-10-CM | POA: Diagnosis not present

## 2019-08-05 DIAGNOSIS — G8929 Other chronic pain: Secondary | ICD-10-CM | POA: Diagnosis not present

## 2019-08-06 DIAGNOSIS — G8929 Other chronic pain: Secondary | ICD-10-CM | POA: Diagnosis not present

## 2019-08-14 DIAGNOSIS — G8929 Other chronic pain: Secondary | ICD-10-CM | POA: Diagnosis not present

## 2019-08-15 DIAGNOSIS — G8929 Other chronic pain: Secondary | ICD-10-CM | POA: Diagnosis not present

## 2019-08-16 DIAGNOSIS — G8929 Other chronic pain: Secondary | ICD-10-CM | POA: Diagnosis not present

## 2019-08-17 DIAGNOSIS — G8929 Other chronic pain: Secondary | ICD-10-CM | POA: Diagnosis not present

## 2019-08-18 DIAGNOSIS — G8929 Other chronic pain: Secondary | ICD-10-CM | POA: Diagnosis not present

## 2019-08-19 DIAGNOSIS — G8929 Other chronic pain: Secondary | ICD-10-CM | POA: Diagnosis not present

## 2019-08-20 DIAGNOSIS — G8929 Other chronic pain: Secondary | ICD-10-CM | POA: Diagnosis not present

## 2019-08-23 DIAGNOSIS — G8929 Other chronic pain: Secondary | ICD-10-CM | POA: Diagnosis not present

## 2019-08-24 DIAGNOSIS — G8929 Other chronic pain: Secondary | ICD-10-CM | POA: Diagnosis not present

## 2019-08-25 DIAGNOSIS — G8929 Other chronic pain: Secondary | ICD-10-CM | POA: Diagnosis not present

## 2019-08-26 DIAGNOSIS — G8929 Other chronic pain: Secondary | ICD-10-CM | POA: Diagnosis not present

## 2019-08-27 DIAGNOSIS — G8929 Other chronic pain: Secondary | ICD-10-CM | POA: Diagnosis not present

## 2019-08-28 DIAGNOSIS — G8929 Other chronic pain: Secondary | ICD-10-CM | POA: Diagnosis not present

## 2019-08-29 ENCOUNTER — Other Ambulatory Visit: Payer: Self-pay | Admitting: Family Medicine

## 2019-08-29 DIAGNOSIS — G8929 Other chronic pain: Secondary | ICD-10-CM | POA: Diagnosis not present

## 2019-08-30 DIAGNOSIS — G8929 Other chronic pain: Secondary | ICD-10-CM | POA: Diagnosis not present

## 2019-08-31 DIAGNOSIS — G8929 Other chronic pain: Secondary | ICD-10-CM | POA: Diagnosis not present

## 2019-09-01 DIAGNOSIS — G8929 Other chronic pain: Secondary | ICD-10-CM | POA: Diagnosis not present

## 2019-09-02 DIAGNOSIS — G8929 Other chronic pain: Secondary | ICD-10-CM | POA: Diagnosis not present

## 2019-09-03 DIAGNOSIS — G8929 Other chronic pain: Secondary | ICD-10-CM | POA: Diagnosis not present

## 2019-09-04 DIAGNOSIS — G8929 Other chronic pain: Secondary | ICD-10-CM | POA: Diagnosis not present

## 2019-09-05 DIAGNOSIS — G8929 Other chronic pain: Secondary | ICD-10-CM | POA: Diagnosis not present

## 2019-09-06 DIAGNOSIS — G8929 Other chronic pain: Secondary | ICD-10-CM | POA: Diagnosis not present

## 2019-09-07 DIAGNOSIS — G8929 Other chronic pain: Secondary | ICD-10-CM | POA: Diagnosis not present

## 2019-09-08 DIAGNOSIS — G8929 Other chronic pain: Secondary | ICD-10-CM | POA: Diagnosis not present

## 2019-09-09 DIAGNOSIS — G8929 Other chronic pain: Secondary | ICD-10-CM | POA: Diagnosis not present

## 2019-09-10 DIAGNOSIS — G8929 Other chronic pain: Secondary | ICD-10-CM | POA: Diagnosis not present

## 2019-09-11 DIAGNOSIS — G8929 Other chronic pain: Secondary | ICD-10-CM | POA: Diagnosis not present

## 2019-09-12 DIAGNOSIS — G8929 Other chronic pain: Secondary | ICD-10-CM | POA: Diagnosis not present

## 2019-09-13 DIAGNOSIS — G8929 Other chronic pain: Secondary | ICD-10-CM | POA: Diagnosis not present

## 2019-09-14 DIAGNOSIS — G8929 Other chronic pain: Secondary | ICD-10-CM | POA: Diagnosis not present

## 2019-09-15 DIAGNOSIS — G8929 Other chronic pain: Secondary | ICD-10-CM | POA: Diagnosis not present

## 2019-09-16 DIAGNOSIS — G8929 Other chronic pain: Secondary | ICD-10-CM | POA: Diagnosis not present

## 2019-09-17 DIAGNOSIS — G8929 Other chronic pain: Secondary | ICD-10-CM | POA: Diagnosis not present

## 2019-09-18 DIAGNOSIS — G8929 Other chronic pain: Secondary | ICD-10-CM | POA: Diagnosis not present

## 2019-09-19 DIAGNOSIS — G8929 Other chronic pain: Secondary | ICD-10-CM | POA: Diagnosis not present
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
## 2019-09-20 ENCOUNTER — Telehealth: Payer: Medicaid Other | Admitting: Cardiology

## 2019-09-20 DIAGNOSIS — G8929 Other chronic pain: Secondary | ICD-10-CM | POA: Diagnosis not present

## 2019-09-21 DIAGNOSIS — G8929 Other chronic pain: Secondary | ICD-10-CM | POA: Diagnosis not present

## 2019-09-22 DIAGNOSIS — G8929 Other chronic pain: Secondary | ICD-10-CM | POA: Diagnosis not present

## 2019-09-23 DIAGNOSIS — G8929 Other chronic pain: Secondary | ICD-10-CM | POA: Diagnosis not present

## 2019-09-24 DIAGNOSIS — G8929 Other chronic pain: Secondary | ICD-10-CM | POA: Diagnosis not present

## 2019-09-25 ENCOUNTER — Telehealth: Payer: Self-pay | Admitting: Family Medicine

## 2019-09-25 DIAGNOSIS — G8929 Other chronic pain: Secondary | ICD-10-CM | POA: Diagnosis not present

## 2019-09-25 NOTE — Telephone Encounter (Signed)
Patient aware and has apt.

## 2019-09-25 NOTE — Telephone Encounter (Signed)
Pt called stating that the last pain clinic we sent him to did not work out. Pt would like [place] to send him to a different pain clinic. Pt says the pain clinic he was going to before this last one, he can't go to anymore because he broke the contract with them.

## 2019-09-25 NOTE — Telephone Encounter (Signed)
Pt. Needs to be seen for this. Thanks, WS 

## 2019-09-26 ENCOUNTER — Encounter: Payer: Self-pay | Admitting: Family Medicine

## 2019-09-26 ENCOUNTER — Ambulatory Visit (INDEPENDENT_AMBULATORY_CARE_PROVIDER_SITE_OTHER): Payer: Medicaid Other | Admitting: Family Medicine

## 2019-09-26 ENCOUNTER — Encounter: Payer: Self-pay | Admitting: Cardiology

## 2019-09-26 DIAGNOSIS — L309 Dermatitis, unspecified: Secondary | ICD-10-CM

## 2019-09-26 DIAGNOSIS — G8929 Other chronic pain: Secondary | ICD-10-CM | POA: Diagnosis not present

## 2019-09-26 DIAGNOSIS — M545 Low back pain, unspecified: Secondary | ICD-10-CM

## 2019-09-26 MED ORDER — PREDNISONE 10 MG PO TABS: ORAL_TABLET | ORAL | 0 refills | Status: DC

## 2019-09-26 NOTE — Progress Notes (Signed)
Subjective:    Patient ID: Calvin Aguirre, male    DOB: 1960-09-16, 60 y.o.   MRN: [id number]   HPI: Calvin Aguirre is a 59 y.o. male presenting for two rashes. One on chest. Moved to the face, Cortisone cream helps. Bumps and itchiing occur. Has been on chin, mustache. Gets red, flaky.   Another One in the blood that pops up everywhere. Not really anything to see. Severe itching.Turns read.  Pt. Needs referral to pain clinic. Calvin Aguirre didn't work out because provider went on vacation and he couldn't get his medication. [facility] made him stand outside in the summer sun. No place to sit. Had no access to a computer.    Depression screen [facility] 2/9 05/31/2018 12/02/2017 07/13/2017 04/15/2017 01/14/2017  Decreased Interest 1 0 0 0 0  Down, Depressed, Hopeless 0 0 0 0 0  PHQ - 2 Score 1 0 0 0 0  Altered sleeping - - - - -  Tired, decreased energy - - - - -  Change in appetite - - - - -  Feeling bad or failure about yourself  - - - - -  Trouble concentrating - - - - -  Moving slowly or fidgety/restless - - - - -  Suicidal thoughts - - - - -  PHQ-9 Score - - - - -  Difficult doing work/chores - - - - -  Some recent data might be hidden     Relevant past medical, surgical, family and social history reviewed and updated as indicated.  Interim medical history since our last visit reviewed. Allergies and medications reviewed and updated.  ROS:  Review of Systems   Social History   Tobacco Use  Smoking Status Current Every Day Smoker  . Packs/day: 2.00  . Years: 20.00  . Pack years: 40.00  . Types: Cigarettes  . Start date: 09/22/1976  Smokeless Tobacco Never Used       Objective:     Wt Readings from Last 3 Encounters:  06/15/19 272 lb 8 oz (123.6 kg)  03/20/19 260 lb (117.9 kg)  03/06/19 250 lb (113.4 kg)     Exam deferred. Pt. Harboring due to COVID 19. Phone visit performed.   Assessment & Plan:   1. Lumbar pain   2. Eczema, unspecified type     Meds ordered  this encounter  Medications  . predniSONE (DELTASONE) 10 MG tablet    Sig: Take 5 daily for 3 days followed by 4,3,2 and 1 for 3 days each.    Dispense:  45 tablet    Refill:  0    Orders Placed This Encounter  Procedures  . Ambulatory referral to Pain Clinic    Referral Priority:   Routine    Referral Type:   Consultation    Referral Reason:   Specialty Services Required    Requested Specialty:   Pain Medicine    Number of Visits Requested:   1      Diagnoses and all orders for this visit:  Lumbar pain -     Ambulatory referral to Pain Clinic  Eczema, unspecified type  Other orders -     predniSONE (DELTASONE) 10 MG tablet; Take 5 daily for 3 days followed by 4,3,2 and 1 for 3 days each.    Virtual Visit via telephone Note  I discussed the limitations, risks, security and privacy concerns of performing an evaluation and management service by telephone and the availability of in person appointments. The  patient was identified with two identifiers. Pt.expressed understanding and agreed to proceed. Pt. Is at home. Dr. Livia Snellen is in his office.  Follow Up Instructions:   I discussed the assessment and treatment plan with the patient. The patient was provided an opportunity to ask questions and all were answered. The patient agreed with the plan and demonstrated an understanding of the instructions.   The patient was advised to call back or seek an in-person evaluation if the symptoms worsen or if the condition fails to improve as anticipated.   Total minutes including chart review and phone contact time: 14   Follow up plan: No follow-ups on file.  Calvin Fraise, MD [place]
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
## 2019-09-27 ENCOUNTER — Encounter: Payer: Self-pay | Admitting: Cardiology

## 2019-09-27 ENCOUNTER — Telehealth (INDEPENDENT_AMBULATORY_CARE_PROVIDER_SITE_OTHER): Payer: Medicaid Other | Admitting: Cardiology

## 2019-09-27 VITALS — BP 142/83 | HR 82 | Ht 75.0 in | Wt 275.0 lb

## 2019-09-27 DIAGNOSIS — I10 Essential (primary) hypertension: Secondary | ICD-10-CM

## 2019-09-27 DIAGNOSIS — I480 Paroxysmal atrial fibrillation: Secondary | ICD-10-CM | POA: Diagnosis not present

## 2019-09-27 DIAGNOSIS — G8929 Other chronic pain: Secondary | ICD-10-CM | POA: Diagnosis not present

## 2019-09-27 DIAGNOSIS — I6523 Occlusion and stenosis of bilateral carotid arteries: Secondary | ICD-10-CM | POA: Diagnosis not present

## 2019-09-27 DIAGNOSIS — Z79899 Other long term (current) drug therapy: Secondary | ICD-10-CM

## 2019-09-27 NOTE — Patient Instructions (Addendum)
Medication Instructions:   Your physician recommends that you continue on your current medications as directed. Please refer to the Current Medication list given to you today.  Labwork:  Your physician recommends that you return for non-fasting lab work as soon as possible to check your BMET and CBC. This may be done at [place].   Testing/Procedures:  NONE  Follow-Up:  Your physician recommends that you schedule a follow-up appointment in: 6 months (office). You will receive a reminder letter in the mail in about 4 months reminding you to call and schedule your appointment. If you don't receive this letter, please contact our office.  Any Other Special Instructions Will Be Listed Below (If Applicable).  If you need a refill on your cardiac medications before your next appointment, please call your pharmacy.

## 2019-09-28 DIAGNOSIS — G8929 Other chronic pain: Secondary | ICD-10-CM | POA: Diagnosis not present

## 2019-09-29 DIAGNOSIS — G8929 Other chronic pain: Secondary | ICD-10-CM | POA: Diagnosis not present

## 2019-09-30 DIAGNOSIS — G8929 Other chronic pain: Secondary | ICD-10-CM | POA: Diagnosis not present

## 2019-10-01 DIAGNOSIS — G8929 Other chronic pain: Secondary | ICD-10-CM | POA: Diagnosis not present

## 2019-10-02 DIAGNOSIS — G8929 Other chronic pain: Secondary | ICD-10-CM | POA: Diagnosis not present

## 2019-10-03 DIAGNOSIS — G8929 Other chronic pain: Secondary | ICD-10-CM | POA: Diagnosis not present

## 2019-10-04 DIAGNOSIS — G8929 Other chronic pain: Secondary | ICD-10-CM | POA: Diagnosis not present

## 2019-10-05 DIAGNOSIS — G8929 Other chronic pain: Secondary | ICD-10-CM | POA: Diagnosis not present

## 2019-10-06 DIAGNOSIS — G8929 Other chronic pain: Secondary | ICD-10-CM | POA: Diagnosis not present

## 2019-10-07 DIAGNOSIS — G8929 Other chronic pain: Secondary | ICD-10-CM | POA: Diagnosis not present

## 2019-10-08 DIAGNOSIS — G8929 Other chronic pain: Secondary | ICD-10-CM | POA: Diagnosis not present

## 2019-10-09 DIAGNOSIS — G8929 Other chronic pain: Secondary | ICD-10-CM | POA: Diagnosis not present

## 2019-10-10 DIAGNOSIS — G8929 Other chronic pain: Secondary | ICD-10-CM | POA: Diagnosis not present

## 2019-10-11 DIAGNOSIS — G8929 Other chronic pain: Secondary | ICD-10-CM | POA: Diagnosis not present

## 2019-10-12 DIAGNOSIS — G8929 Other chronic pain: Secondary | ICD-10-CM | POA: Diagnosis not present

## 2019-10-13 DIAGNOSIS — G8929 Other chronic pain: Secondary | ICD-10-CM | POA: Diagnosis not present

## 2019-10-14 DIAGNOSIS — G8929 Other chronic pain: Secondary | ICD-10-CM | POA: Diagnosis not present

## 2019-10-15 DIAGNOSIS — G8929 Other chronic pain: Secondary | ICD-10-CM | POA: Diagnosis not present

## 2019-10-16 ENCOUNTER — Telehealth: Payer: Self-pay | Admitting: Family Medicine

## 2019-10-16 DIAGNOSIS — M545 Low back pain, unspecified: Secondary | ICD-10-CM

## 2019-10-16 DIAGNOSIS — M549 Dorsalgia, unspecified: Secondary | ICD-10-CM

## 2019-10-16 DIAGNOSIS — G8929 Other chronic pain: Secondary | ICD-10-CM

## 2019-10-16 NOTE — Telephone Encounter (Signed)
In referrals is states that [facility] has nothing to offer pt. Are their any other options for pt?

## 2019-10-16 NOTE — Telephone Encounter (Signed)
None that I know of. Perhaps courtney has a suggestion.

## 2019-10-17 ENCOUNTER — Telehealth: Payer: Self-pay | Admitting: *Deleted

## 2019-10-17 ENCOUNTER — Other Ambulatory Visit: Payer: Self-pay | Admitting: Family Medicine

## 2019-10-17 DIAGNOSIS — M545 Low back pain, unspecified: Secondary | ICD-10-CM

## 2019-10-17 DIAGNOSIS — G8929 Other chronic pain: Secondary | ICD-10-CM | POA: Diagnosis not present

## 2019-10-17 NOTE — Telephone Encounter (Signed)
  REFERRAL REQUEST Telephone Note 10/17/2019  What type of referral do you need? Orthopendic- Back pain   Have you been seen at our office for this problem? yes (Advise that they may need an appointment with their PCP before a referral can be done)  Is there a particular doctor or location that you prefer? [facility]  Patient notified that referrals can take up to a week or longer to process. If they haven't heard anything within a week they should call back and speak with the referral department.

## 2019-10-17 NOTE — Telephone Encounter (Signed)
Patient aware.

## 2019-10-18 DIAGNOSIS — G8929 Other chronic pain: Secondary | ICD-10-CM | POA: Diagnosis not present

## 2019-10-19 DIAGNOSIS — G8929 Other chronic pain: Secondary | ICD-10-CM | POA: Diagnosis not present

## 2019-10-20 DIAGNOSIS — G8929 Other chronic pain: Secondary | ICD-10-CM | POA: Diagnosis not present

## 2019-10-21 DIAGNOSIS — G8929 Other chronic pain: Secondary | ICD-10-CM | POA: Diagnosis not present

## 2019-10-22 DIAGNOSIS — G8929 Other chronic pain: Secondary | ICD-10-CM | POA: Diagnosis not present

## 2019-10-23 ENCOUNTER — Telehealth: Payer: Self-pay | Admitting: Family Medicine

## 2019-10-23 DIAGNOSIS — G8929 Other chronic pain: Secondary | ICD-10-CM | POA: Diagnosis not present

## 2019-10-23 NOTE — Telephone Encounter (Signed)
  REFERRAL REQUEST Telephone Note 10/23/2019  What type of referral do you need? Pain Clinic & Ortho  Have you been seen at our office for this problem? Yes, [facility] (Advise that they may need an appointment with their PCP before a referral can be done)  Is there a particular doctor or location that you prefer? [facility] in [place].  Patient notified that referrals can take up to a week or longer to process. If they haven't heard anything within a week they should call back and speak with the referral department.   [facility]' pt.  He is still waiting on both referrals.

## 2019-10-24 DIAGNOSIS — G8929 Other chronic pain: Secondary | ICD-10-CM | POA: Diagnosis not present

## 2019-10-25 DIAGNOSIS — G8929 Other chronic pain: Secondary | ICD-10-CM | POA: Diagnosis not present

## 2019-10-26 ENCOUNTER — Other Ambulatory Visit: Payer: Self-pay | Admitting: Family Medicine

## 2019-10-26 DIAGNOSIS — G8929 Other chronic pain: Secondary | ICD-10-CM | POA: Diagnosis not present

## 2019-10-27 DIAGNOSIS — G8929 Other chronic pain: Secondary | ICD-10-CM | POA: Diagnosis not present

## 2019-10-28 DIAGNOSIS — G8929 Other chronic pain: Secondary | ICD-10-CM | POA: Diagnosis not present

## 2019-10-29 DIAGNOSIS — G8929 Other chronic pain: Secondary | ICD-10-CM | POA: Diagnosis not present

## 2019-10-30 DIAGNOSIS — G8929 Other chronic pain: Secondary | ICD-10-CM | POA: Diagnosis not present

## 2019-10-31 DIAGNOSIS — G8929 Other chronic pain: Secondary | ICD-10-CM | POA: Diagnosis not present

## 2019-10-31 DIAGNOSIS — R0781 Pleurodynia: Secondary | ICD-10-CM | POA: Diagnosis not present

## 2019-11-01 ENCOUNTER — Other Ambulatory Visit: Payer: Self-pay | Admitting: Family Medicine

## 2019-11-01 DIAGNOSIS — G8929 Other chronic pain: Secondary | ICD-10-CM | POA: Diagnosis not present

## 2019-11-01 NOTE — Telephone Encounter (Signed)
Done. Thanks, WS 

## 2019-11-01 NOTE — Telephone Encounter (Signed)
Aware. Referrals done.

## 2019-11-02 DIAGNOSIS — G8929 Other chronic pain: Secondary | ICD-10-CM | POA: Diagnosis not present

## 2019-11-03 DIAGNOSIS — G8929 Other chronic pain: Secondary | ICD-10-CM | POA: Diagnosis not present

## 2019-11-04 DIAGNOSIS — G8929 Other chronic pain: Secondary | ICD-10-CM | POA: Diagnosis not present

## 2019-11-05 DIAGNOSIS — G8929 Other chronic pain: Secondary | ICD-10-CM | POA: Diagnosis not present

## 2019-11-06 DIAGNOSIS — G8929 Other chronic pain: Secondary | ICD-10-CM | POA: Diagnosis not present

## 2019-11-07 DIAGNOSIS — G8929 Other chronic pain: Secondary | ICD-10-CM | POA: Diagnosis not present

## 2019-11-08 DIAGNOSIS — M5416 Radiculopathy, lumbar region: Secondary | ICD-10-CM | POA: Diagnosis not present

## 2019-11-08 DIAGNOSIS — G8929 Other chronic pain: Secondary | ICD-10-CM | POA: Diagnosis not present

## 2019-11-08 DIAGNOSIS — M62830 Muscle spasm of back: Secondary | ICD-10-CM | POA: Diagnosis not present

## 2019-11-09 DIAGNOSIS — G8929 Other chronic pain: Secondary | ICD-10-CM | POA: Diagnosis not present

## 2019-11-10 DIAGNOSIS — G8929 Other chronic pain: Secondary | ICD-10-CM | POA: Diagnosis not present

## 2019-11-11 DIAGNOSIS — G8929 Other chronic pain: Secondary | ICD-10-CM | POA: Diagnosis not present

## 2019-11-12 DIAGNOSIS — G8929 Other chronic pain: Secondary | ICD-10-CM | POA: Diagnosis not present

## 2019-11-13 DIAGNOSIS — G8929 Other chronic pain: Secondary | ICD-10-CM | POA: Diagnosis not present

## 2019-11-14 DIAGNOSIS — G8929 Other chronic pain: Secondary | ICD-10-CM | POA: Diagnosis not present

## 2019-11-15 DIAGNOSIS — G8929 Other chronic pain: Secondary | ICD-10-CM | POA: Diagnosis not present

## 2019-11-16 DIAGNOSIS — G8929 Other chronic pain: Secondary | ICD-10-CM | POA: Diagnosis not present

## 2019-11-17 DIAGNOSIS — G8929 Other chronic pain: Secondary | ICD-10-CM | POA: Diagnosis not present

## 2019-11-18 DIAGNOSIS — G8929 Other chronic pain: Secondary | ICD-10-CM | POA: Diagnosis not present

## 2019-11-19 DIAGNOSIS — G8929 Other chronic pain: Secondary | ICD-10-CM | POA: Diagnosis not present

## 2019-11-20 DIAGNOSIS — G8929 Other chronic pain: Secondary | ICD-10-CM | POA: Diagnosis not present

## 2019-11-21 DIAGNOSIS — G8929 Other chronic pain: Secondary | ICD-10-CM | POA: Diagnosis not present

## 2019-11-22 DIAGNOSIS — G8929 Other chronic pain: Secondary | ICD-10-CM | POA: Diagnosis not present

## 2019-11-23 DIAGNOSIS — G8929 Other chronic pain: Secondary | ICD-10-CM | POA: Diagnosis not present

## 2019-11-24 DIAGNOSIS — G8929 Other chronic pain: Secondary | ICD-10-CM | POA: Diagnosis not present

## 2019-11-25 DIAGNOSIS — G8929 Other chronic pain: Secondary | ICD-10-CM | POA: Diagnosis not present

## 2019-11-26 DIAGNOSIS — G8929 Other chronic pain: Secondary | ICD-10-CM | POA: Diagnosis not present

## 2019-11-27 DIAGNOSIS — G8929 Other chronic pain: Secondary | ICD-10-CM | POA: Diagnosis not present

## 2019-11-28 DIAGNOSIS — G8929 Other chronic pain: Secondary | ICD-10-CM | POA: Diagnosis not present

## 2019-11-29 DIAGNOSIS — G8929 Other chronic pain: Secondary | ICD-10-CM | POA: Diagnosis not present

## 2019-11-30 DIAGNOSIS — G8929 Other chronic pain: Secondary | ICD-10-CM | POA: Diagnosis not present

## 2019-12-01 DIAGNOSIS — G8929 Other chronic pain: Secondary | ICD-10-CM | POA: Diagnosis not present

## 2019-12-02 DIAGNOSIS — G8929 Other chronic pain: Secondary | ICD-10-CM | POA: Diagnosis not present

## 2019-12-03 DIAGNOSIS — G8929 Other chronic pain: Secondary | ICD-10-CM | POA: Diagnosis not present

## 2019-12-04 DIAGNOSIS — G8929 Other chronic pain: Secondary | ICD-10-CM | POA: Diagnosis not present

## 2019-12-05 DIAGNOSIS — G8929 Other chronic pain: Secondary | ICD-10-CM | POA: Diagnosis not present

## 2019-12-06 DIAGNOSIS — G8929 Other chronic pain: Secondary | ICD-10-CM | POA: Diagnosis not present

## 2019-12-07 DIAGNOSIS — G8929 Other chronic pain: Secondary | ICD-10-CM | POA: Diagnosis not present

## 2019-12-08 DIAGNOSIS — G8929 Other chronic pain: Secondary | ICD-10-CM | POA: Diagnosis not present

## 2019-12-09 DIAGNOSIS — G8929 Other chronic pain: Secondary | ICD-10-CM | POA: Diagnosis not present

## 2019-12-10 DIAGNOSIS — G8929 Other chronic pain: Secondary | ICD-10-CM | POA: Diagnosis not present

## 2019-12-11 DIAGNOSIS — G8929 Other chronic pain: Secondary | ICD-10-CM | POA: Diagnosis not present

## 2019-12-12 DIAGNOSIS — G8929 Other chronic pain: Secondary | ICD-10-CM | POA: Diagnosis not present

## 2019-12-13 DIAGNOSIS — G8929 Other chronic pain: Secondary | ICD-10-CM | POA: Diagnosis not present

## 2019-12-14 ENCOUNTER — Other Ambulatory Visit: Payer: Self-pay | Admitting: Family Medicine

## 2019-12-14 DIAGNOSIS — G8929 Other chronic pain: Secondary | ICD-10-CM | POA: Diagnosis not present

## 2019-12-15 DIAGNOSIS — G8929 Other chronic pain: Secondary | ICD-10-CM | POA: Diagnosis not present

## 2019-12-16 DIAGNOSIS — G8929 Other chronic pain: Secondary | ICD-10-CM | POA: Diagnosis not present

## 2019-12-17 ENCOUNTER — Other Ambulatory Visit: Payer: Self-pay | Admitting: Family Medicine

## 2019-12-17 DIAGNOSIS — G8929 Other chronic pain: Secondary | ICD-10-CM | POA: Diagnosis not present

## 2019-12-18 DIAGNOSIS — G8929 Other chronic pain: Secondary | ICD-10-CM | POA: Diagnosis not present

## 2019-12-19 DIAGNOSIS — G8929 Other chronic pain: Secondary | ICD-10-CM | POA: Diagnosis not present

## 2019-12-20 DIAGNOSIS — G8929 Other chronic pain: Secondary | ICD-10-CM | POA: Diagnosis not present

## 2019-12-21 DIAGNOSIS — G8929 Other chronic pain: Secondary | ICD-10-CM | POA: Diagnosis not present

## 2019-12-22 DIAGNOSIS — G8929 Other chronic pain: Secondary | ICD-10-CM | POA: Diagnosis not present

## 2019-12-23 DIAGNOSIS — G8929 Other chronic pain: Secondary | ICD-10-CM | POA: Diagnosis not present

## 2019-12-24 DIAGNOSIS — G8929 Other chronic pain: Secondary | ICD-10-CM | POA: Diagnosis not present

## 2019-12-25 DIAGNOSIS — G8929 Other chronic pain: Secondary | ICD-10-CM | POA: Diagnosis not present

## 2019-12-26 DIAGNOSIS — G8929 Other chronic pain: Secondary | ICD-10-CM | POA: Diagnosis not present

## 2019-12-27 DIAGNOSIS — G8929 Other chronic pain: Secondary | ICD-10-CM | POA: Diagnosis not present

## 2019-12-28 DIAGNOSIS — G8929 Other chronic pain: Secondary | ICD-10-CM | POA: Diagnosis not present

## 2019-12-29 DIAGNOSIS — G8929 Other chronic pain: Secondary | ICD-10-CM | POA: Diagnosis not present

## 2019-12-30 DIAGNOSIS — G8929 Other chronic pain: Secondary | ICD-10-CM | POA: Diagnosis not present

## 2019-12-31 DIAGNOSIS — G8929 Other chronic pain: Secondary | ICD-10-CM | POA: Diagnosis not present

## 2020-01-01 DIAGNOSIS — C61 Malignant neoplasm of prostate: Secondary | ICD-10-CM | POA: Diagnosis not present

## 2020-01-01 DIAGNOSIS — N5201 Erectile dysfunction due to arterial insufficiency: Secondary | ICD-10-CM | POA: Diagnosis not present

## 2020-01-01 DIAGNOSIS — G8929 Other chronic pain: Secondary | ICD-10-CM | POA: Diagnosis not present

## 2020-01-01 DIAGNOSIS — R310 Gross hematuria: Secondary | ICD-10-CM | POA: Diagnosis not present

## 2020-01-01 DIAGNOSIS — C775 Secondary and unspecified malignant neoplasm of intrapelvic lymph nodes: Secondary | ICD-10-CM | POA: Diagnosis not present

## 2020-01-01 DIAGNOSIS — N393 Stress incontinence (female) (male): Secondary | ICD-10-CM | POA: Diagnosis not present

## 2020-01-01 DIAGNOSIS — Z7689 Persons encountering health services in other specified circumstances: Secondary | ICD-10-CM | POA: Diagnosis not present

## 2020-01-02 DIAGNOSIS — G8929 Other chronic pain: Secondary | ICD-10-CM | POA: Diagnosis not present

## 2020-01-03 DIAGNOSIS — G8929 Other chronic pain: Secondary | ICD-10-CM | POA: Diagnosis not present

## 2020-01-04 DIAGNOSIS — G8929 Other chronic pain: Secondary | ICD-10-CM | POA: Diagnosis not present

## 2020-01-05 DIAGNOSIS — G8929 Other chronic pain: Secondary | ICD-10-CM | POA: Diagnosis not present

## 2020-01-06 DIAGNOSIS — G8929 Other chronic pain: Secondary | ICD-10-CM | POA: Diagnosis not present

## 2020-01-07 DIAGNOSIS — G8929 Other chronic pain: Secondary | ICD-10-CM | POA: Diagnosis not present

## 2020-01-08 DIAGNOSIS — G8929 Other chronic pain: Secondary | ICD-10-CM | POA: Diagnosis not present

## 2020-01-09 DIAGNOSIS — G8929 Other chronic pain: Secondary | ICD-10-CM | POA: Diagnosis not present

## 2020-01-10 DIAGNOSIS — G8929 Other chronic pain: Secondary | ICD-10-CM | POA: Diagnosis not present

## 2020-01-11 DIAGNOSIS — G8929 Other chronic pain: Secondary | ICD-10-CM | POA: Diagnosis not present

## 2020-01-12 DIAGNOSIS — G8929 Other chronic pain: Secondary | ICD-10-CM | POA: Diagnosis not present

## 2020-01-13 DIAGNOSIS — G8929 Other chronic pain: Secondary | ICD-10-CM | POA: Diagnosis not present

## 2020-01-14 DIAGNOSIS — G8929 Other chronic pain: Secondary | ICD-10-CM | POA: Diagnosis not present

## 2020-01-15 DIAGNOSIS — G8929 Other chronic pain: Secondary | ICD-10-CM | POA: Diagnosis not present

## 2020-01-16 DIAGNOSIS — G8929 Other chronic pain: Secondary | ICD-10-CM | POA: Diagnosis not present

## 2020-01-17 DIAGNOSIS — G8929 Other chronic pain: Secondary | ICD-10-CM | POA: Diagnosis not present

## 2020-01-18 DIAGNOSIS — G8929 Other chronic pain: Secondary | ICD-10-CM | POA: Diagnosis not present

## 2020-01-19 DIAGNOSIS — G8929 Other chronic pain: Secondary | ICD-10-CM | POA: Diagnosis not present

## 2020-01-20 DIAGNOSIS — G8929 Other chronic pain: Secondary | ICD-10-CM | POA: Diagnosis not present

## 2020-01-21 DIAGNOSIS — G8929 Other chronic pain: Secondary | ICD-10-CM | POA: Diagnosis not present

## 2020-01-29 DIAGNOSIS — G8929 Other chronic pain: Secondary | ICD-10-CM | POA: Diagnosis not present

## 2020-01-30 ENCOUNTER — Telehealth: Payer: Self-pay | Admitting: Family Medicine

## 2020-01-30 DIAGNOSIS — G8929 Other chronic pain: Secondary | ICD-10-CM | POA: Diagnosis not present

## 2020-01-30 NOTE — Telephone Encounter (Signed)
Advised pt that Ambien is controlled and can't be filled outside of an office visit. Pt was last seen for chronic follow up 07/03/19 and was supposed to be seen in June. Appt scheduled 02/02/20 at 9:55 with Dr Livia Snellen.

## 2020-01-31 DIAGNOSIS — G8929 Other chronic pain: Secondary | ICD-10-CM | POA: Diagnosis not present

## 2020-02-01 DIAGNOSIS — G8929 Other chronic pain: Secondary | ICD-10-CM | POA: Diagnosis not present

## 2020-02-02 ENCOUNTER — Encounter: Payer: Self-pay | Admitting: Family Medicine

## 2020-02-02 ENCOUNTER — Other Ambulatory Visit: Payer: Self-pay

## 2020-02-02 ENCOUNTER — Ambulatory Visit (INDEPENDENT_AMBULATORY_CARE_PROVIDER_SITE_OTHER): Payer: Medicaid Other | Admitting: Family Medicine

## 2020-02-02 ENCOUNTER — Telehealth: Payer: Self-pay | Admitting: *Deleted

## 2020-02-02 VITALS — BP 132/82 | HR 66 | Temp 97.3°F | Resp 20 | Ht 75.0 in | Wt 272.0 lb

## 2020-02-02 DIAGNOSIS — G8929 Other chronic pain: Secondary | ICD-10-CM | POA: Diagnosis not present

## 2020-02-02 DIAGNOSIS — F411 Generalized anxiety disorder: Secondary | ICD-10-CM

## 2020-02-02 DIAGNOSIS — G4700 Insomnia, unspecified: Secondary | ICD-10-CM

## 2020-02-02 DIAGNOSIS — M545 Low back pain, unspecified: Secondary | ICD-10-CM

## 2020-02-02 DIAGNOSIS — I10 Essential (primary) hypertension: Secondary | ICD-10-CM | POA: Diagnosis not present

## 2020-02-02 DIAGNOSIS — M549 Dorsalgia, unspecified: Secondary | ICD-10-CM

## 2020-02-02 DIAGNOSIS — E785 Hyperlipidemia, unspecified: Secondary | ICD-10-CM

## 2020-02-02 DIAGNOSIS — Z1159 Encounter for screening for other viral diseases: Secondary | ICD-10-CM

## 2020-02-02 DIAGNOSIS — Z114 Encounter for screening for human immunodeficiency virus [HIV]: Secondary | ICD-10-CM

## 2020-02-02 DIAGNOSIS — Z720 Tobacco use: Secondary | ICD-10-CM | POA: Diagnosis not present

## 2020-02-02 DIAGNOSIS — L309 Dermatitis, unspecified: Secondary | ICD-10-CM

## 2020-02-02 MED ORDER — ZOLPIDEM TARTRATE 10 MG PO TABS: 10.0000 mg | ORAL_TABLET | Freq: Every day | ORAL | 1 refills | Status: DC

## 2020-02-02 MED ORDER — GABAPENTIN 300 MG PO CAPS: 300.0000 mg | ORAL_CAPSULE | Freq: Three times a day (TID) | ORAL | 2 refills | Status: DC

## 2020-02-02 MED ORDER — DULOXETINE HCL 60 MG PO CPEP: 60.0000 mg | ORAL_CAPSULE | Freq: Every day | ORAL | 3 refills | Status: DC

## 2020-02-02 MED ORDER — APIXABAN 5 MG PO TABS: 5.0000 mg | ORAL_TABLET | Freq: Two times a day (BID) | ORAL | 1 refills | Status: DC

## 2020-02-02 MED ORDER — OMEPRAZOLE 20 MG PO CPDR: DELAYED_RELEASE_CAPSULE | ORAL | 1 refills | Status: DC

## 2020-02-02 MED ORDER — MOMETASONE FUROATE 0.1 % EX CREA: 1.0000 "application " | TOPICAL_CREAM | Freq: Every day | CUTANEOUS | 5 refills | Status: DC

## 2020-02-02 MED ORDER — METOPROLOL SUCCINATE ER 50 MG PO TB24: ORAL_TABLET | ORAL | 1 refills | Status: DC

## 2020-02-02 NOTE — Telephone Encounter (Signed)
PA completed on zolpidem 10mg  Key: [id number] - PA Case ID: [id number] - Rx #: [id number] Need help? Call us at [phone]   Outcome Approved today   PA Case: [id number], Status: Approved, Coverage Starts on: 02/02/2020 12:00:00 AM, Coverage Ends on: 07/31/2020 12:00:00 AM.    Pharmacy aware.
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
## 2020-02-03 ENCOUNTER — Encounter: Payer: Self-pay | Admitting: Family Medicine

## 2020-02-03 DIAGNOSIS — G8929 Other chronic pain: Secondary | ICD-10-CM | POA: Diagnosis not present

## 2020-02-03 LAB — HIV ANTIBODY (ROUTINE TESTING W REFLEX): HIV Screen 4th Generation wRfx: NONREACTIVE

## 2020-02-03 LAB — CBC WITH DIFFERENTIAL/PLATELET
Basophils Absolute: 0.1 10*3/uL (ref 0.0–0.2)
Basos: 1 %
EOS (ABSOLUTE): 0.4 10*3/uL (ref 0.0–0.4)
Eos: 6 %
Hematocrit: 41.4 % (ref 37.5–51.0)
Hemoglobin: 14.1 g/dL (ref 13.0–17.7)
Immature Grans (Abs): 0 10*3/uL (ref 0.0–0.1)
Immature Granulocytes: 0 %
Lymphocytes Absolute: 1.3 10*3/uL (ref 0.7–3.1)
Lymphs: 19 %
MCH: 32.8 pg (ref 26.6–33.0)
MCHC: 34.1 g/dL (ref 31.5–35.7)
MCV: 96 fL (ref 79–97)
Monocytes Absolute: 0.6 10*3/uL (ref 0.1–0.9)
Monocytes: 10 %
Neutrophils Absolute: 4.1 10*3/uL (ref 1.4–7.0)
Neutrophils: 64 %
Platelets: 186 10*3/uL (ref 150–450)
RBC: 4.3 x10E6/uL (ref 4.14–5.80)
RDW: 13.7 % (ref 11.6–15.4)
WBC: 6.5 10*3/uL (ref 3.4–10.8)

## 2020-02-03 LAB — CMP14+EGFR
ALT: 18 IU/L (ref 0–44)
AST: 20 IU/L (ref 0–40)
Albumin/Globulin Ratio: 1.8 (ref 1.2–2.2)
Albumin: 4 g/dL (ref 3.8–4.9)
Alkaline Phosphatase: 59 IU/L (ref 48–121)
BUN/Creatinine Ratio: 9 — ABNORMAL LOW (ref 10–24)
BUN: 12 mg/dL (ref 8–27)
Bilirubin Total: 0.4 mg/dL (ref 0.0–1.2)
CO2: 22 mmol/L (ref 20–29)
Calcium: 9 mg/dL (ref 8.6–10.2)
Chloride: 103 mmol/L (ref 96–106)
Creatinine, Ser: 1.27 mg/dL (ref 0.76–1.27)
GFR calc Af Amer: 71 mL/min/{1.73_m2} (ref >59)
GFR calc non Af Amer: 61 mL/min/{1.73_m2} (ref >59)
Globulin, Total: 2.2 g/dL (ref 1.5–4.5)
Glucose: 133 mg/dL — ABNORMAL HIGH (ref 65–99)
Potassium: 3.8 mmol/L (ref 3.5–5.2)
Sodium: 139 mmol/L (ref 134–144)
Total Protein: 6.2 g/dL (ref 6.0–8.5)

## 2020-02-03 LAB — HEPATITIS C ANTIBODY: Hep C Virus Ab: 0.1 s/co ratio (ref 0.0–0.9)

## 2020-02-03 LAB — LIPID PANEL
Chol/HDL Ratio: 4.6 ratio (ref 0.0–5.0)
Cholesterol, Total: 139 mg/dL (ref 100–199)
HDL: 30 mg/dL — ABNORMAL LOW (ref >39)
LDL Chol Calc (NIH): 67 mg/dL (ref 0–99)
Triglycerides: 257 mg/dL — ABNORMAL HIGH (ref 0–149)
VLDL Cholesterol Cal: 42 mg/dL — ABNORMAL HIGH (ref 5–40)

## 2020-02-03 NOTE — Progress Notes (Signed)
Hello Waldron,  Your lab result is normal and/or stable.Some minor variations that are not significant are commonly marked abnormal, but do not represent any medical problem for you.  Best regards, Claretta Fraise, M.D.

## 2020-02-04 DIAGNOSIS — G8929 Other chronic pain: Secondary | ICD-10-CM | POA: Diagnosis not present

## 2020-02-05 DIAGNOSIS — G8929 Other chronic pain: Secondary | ICD-10-CM | POA: Diagnosis not present

## 2020-02-07 DIAGNOSIS — G8929 Other chronic pain: Secondary | ICD-10-CM | POA: Diagnosis not present

## 2020-02-08 ENCOUNTER — Encounter: Payer: Self-pay | Admitting: Physical Medicine & Rehabilitation

## 2020-02-08 DIAGNOSIS — G8929 Other chronic pain: Secondary | ICD-10-CM | POA: Diagnosis not present

## 2020-02-09 DIAGNOSIS — G8929 Other chronic pain: Secondary | ICD-10-CM | POA: Diagnosis not present

## 2020-02-10 DIAGNOSIS — G8929 Other chronic pain: Secondary | ICD-10-CM | POA: Diagnosis not present

## 2020-02-11 DIAGNOSIS — G8929 Other chronic pain: Secondary | ICD-10-CM | POA: Diagnosis not present

## 2020-02-12 DIAGNOSIS — G8929 Other chronic pain: Secondary | ICD-10-CM | POA: Diagnosis not present

## 2020-02-15 ENCOUNTER — Telehealth: Payer: Self-pay | Admitting: Family Medicine

## 2020-02-15 NOTE — Telephone Encounter (Signed)
Pt called - appt given

## 2020-02-15 NOTE — Telephone Encounter (Signed)
Caryl Pina states that she works with the pt for long term [employer]. She states with his assessment today that he has been having bloody stools for 2-3 months off and on. Pt stated he failed to mention this to Dr. Livia Snellen at his appt last week. Pt also stated to Caryl Pina that he has been having dizziness to almost black out spells daily. May follow up with pt.

## 2020-02-19 ENCOUNTER — Ambulatory Visit: Payer: Medicaid Other | Admitting: Family Medicine

## 2020-02-19 DIAGNOSIS — G8929 Other chronic pain: Secondary | ICD-10-CM | POA: Diagnosis not present

## 2020-02-20 DIAGNOSIS — G8929 Other chronic pain: Secondary | ICD-10-CM | POA: Diagnosis not present

## 2020-02-21 DIAGNOSIS — G8929 Other chronic pain: Secondary | ICD-10-CM | POA: Diagnosis not present

## 2020-02-22 DIAGNOSIS — G8929 Other chronic pain: Secondary | ICD-10-CM | POA: Diagnosis not present

## 2020-02-23 ENCOUNTER — Encounter: Payer: Self-pay | Admitting: Physical Medicine & Rehabilitation

## 2020-02-23 ENCOUNTER — Other Ambulatory Visit: Payer: Self-pay

## 2020-02-23 ENCOUNTER — Encounter: Payer: Medicaid Other | Attending: Physical Medicine & Rehabilitation | Admitting: Physical Medicine & Rehabilitation

## 2020-02-23 VITALS — BP 143/82 | HR 89 | Temp 98.6°F | Ht 75.0 in | Wt 294.0 lb

## 2020-02-23 DIAGNOSIS — Z79891 Long term (current) use of opiate analgesic: Secondary | ICD-10-CM | POA: Insufficient documentation

## 2020-02-23 DIAGNOSIS — M961 Postlaminectomy syndrome, not elsewhere classified: Secondary | ICD-10-CM | POA: Insufficient documentation

## 2020-02-23 DIAGNOSIS — Z5181 Encounter for therapeutic drug level monitoring: Secondary | ICD-10-CM | POA: Insufficient documentation

## 2020-02-23 DIAGNOSIS — G8929 Other chronic pain: Secondary | ICD-10-CM | POA: Diagnosis not present
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
## 2020-02-23 NOTE — Patient Instructions (Addendum)
Need to check urine screen if ok can start tramadol  After meds are started will refer to Home health therapy

## 2020-02-26 DIAGNOSIS — G8929 Other chronic pain: Secondary | ICD-10-CM | POA: Diagnosis not present

## 2020-02-27 ENCOUNTER — Telehealth: Payer: Self-pay

## 2020-02-27 DIAGNOSIS — G8929 Other chronic pain: Secondary | ICD-10-CM | POA: Diagnosis not present

## 2020-02-27 LAB — TOXASSURE SELECT,+ANTIDEPR,UR

## 2020-02-27 NOTE — Telephone Encounter (Signed)
Mr. Thrall would like to know if you will send in the Tramadol for his pain? If so please send to [facility]. Thank you.

## 2020-02-27 NOTE — Telephone Encounter (Signed)
UDS results not yet available please inform patient we must get that result first

## 2020-02-28 DIAGNOSIS — G8929 Other chronic pain: Secondary | ICD-10-CM | POA: Diagnosis not present

## 2020-02-28 MED ORDER — TRAMADOL HCL 50 MG PO TABS: 50.0000 mg | ORAL_TABLET | Freq: Four times a day (QID) | ORAL | 0 refills | Status: DC | PRN

## 2020-02-28 MED ORDER — TRAMADOL HCL 50 MG PO TABS: 50.0000 mg | ORAL_TABLET | Freq: Two times a day (BID) | ORAL | 0 refills | Status: DC

## 2020-02-28 NOTE — Telephone Encounter (Signed)
UDS  results are now available. Please advise. Thank you.

## 2020-02-28 NOTE — Addendum Note (Signed)
Addended by: Charlett Blake on: 02/28/2020 11:17 AM   Modules accepted: Orders

## 2020-02-29 DIAGNOSIS — G8929 Other chronic pain: Secondary | ICD-10-CM | POA: Diagnosis not present

## 2020-03-01 DIAGNOSIS — G8929 Other chronic pain: Secondary | ICD-10-CM | POA: Diagnosis not present

## 2020-03-06 ENCOUNTER — Other Ambulatory Visit: Payer: Self-pay

## 2020-03-06 ENCOUNTER — Encounter: Payer: Medicaid Other | Attending: Registered Nurse | Admitting: Registered Nurse

## 2020-03-06 ENCOUNTER — Encounter: Payer: Self-pay | Admitting: Registered Nurse

## 2020-03-06 VITALS — BP 137/89 | HR 70 | Temp 98.4°F | Ht 75.0 in | Wt 286.6 lb

## 2020-03-06 DIAGNOSIS — M25562 Pain in left knee: Secondary | ICD-10-CM | POA: Insufficient documentation

## 2020-03-06 DIAGNOSIS — Z79891 Long term (current) use of opiate analgesic: Secondary | ICD-10-CM | POA: Diagnosis not present

## 2020-03-06 DIAGNOSIS — M961 Postlaminectomy syndrome, not elsewhere classified: Secondary | ICD-10-CM | POA: Insufficient documentation

## 2020-03-06 DIAGNOSIS — M5416 Radiculopathy, lumbar region: Secondary | ICD-10-CM

## 2020-03-06 DIAGNOSIS — Z79899 Other long term (current) drug therapy: Secondary | ICD-10-CM

## 2020-03-06 DIAGNOSIS — Z5181 Encounter for therapeutic drug level monitoring: Secondary | ICD-10-CM | POA: Diagnosis not present

## 2020-03-06 DIAGNOSIS — G8929 Other chronic pain: Secondary | ICD-10-CM | POA: Diagnosis not present

## 2020-03-06 DIAGNOSIS — M25561 Pain in right knee: Secondary | ICD-10-CM | POA: Insufficient documentation

## 2020-03-06 MED ORDER — TRAMADOL HCL 50 MG PO TABS: 50.0000 mg | ORAL_TABLET | Freq: Four times a day (QID) | ORAL | 0 refills | Status: AC | PRN
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
## 2020-03-07 ENCOUNTER — Telehealth: Payer: Self-pay | Admitting: *Deleted

## 2020-03-07 NOTE — Telephone Encounter (Signed)
Urine drug screen for this encounter is consistent for having no controlled medication. 

## 2020-03-08 ENCOUNTER — Encounter: Payer: Self-pay | Admitting: Registered Nurse

## 2020-03-08 MED ORDER — BUPRENORPHINE 7.5 MCG/HR TD PTWK: 1.0000 | MEDICATED_PATCH | TRANSDERMAL | 0 refills | Status: DC

## 2020-03-11 DIAGNOSIS — G8929 Other chronic pain: Secondary | ICD-10-CM | POA: Diagnosis not present

## 2020-03-12 ENCOUNTER — Other Ambulatory Visit: Payer: Self-pay

## 2020-03-12 ENCOUNTER — Ambulatory Visit: Payer: Medicaid Other | Admitting: Family Medicine

## 2020-03-12 ENCOUNTER — Encounter: Payer: Self-pay | Admitting: Family Medicine

## 2020-03-12 VITALS — BP 140/80 | HR 65 | Temp 98.1°F | Resp 20 | Ht 75.0 in | Wt 293.1 lb

## 2020-03-12 DIAGNOSIS — R569 Unspecified convulsions: Secondary | ICD-10-CM

## 2020-03-12 DIAGNOSIS — I10 Essential (primary) hypertension: Secondary | ICD-10-CM

## 2020-03-12 DIAGNOSIS — G8929 Other chronic pain: Secondary | ICD-10-CM | POA: Diagnosis not present
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
## 2020-03-13 ENCOUNTER — Telehealth: Payer: Self-pay | Admitting: *Deleted

## 2020-03-13 DIAGNOSIS — G8929 Other chronic pain: Secondary | ICD-10-CM | POA: Diagnosis not present

## 2020-03-13 NOTE — Telephone Encounter (Signed)
Prior authorization submitted for Butrans patch.  PA Case: [id number] Status: Approved Coverage Starts on: 03/08/2020, Coverage Ends on: 06/06/2020

## 2020-03-14 DIAGNOSIS — G8929 Other chronic pain: Secondary | ICD-10-CM | POA: Diagnosis not present

## 2020-03-15 DIAGNOSIS — G8929 Other chronic pain: Secondary | ICD-10-CM | POA: Diagnosis not present

## 2020-03-18 DIAGNOSIS — G8929 Other chronic pain: Secondary | ICD-10-CM | POA: Diagnosis not present

## 2020-03-19 DIAGNOSIS — G8929 Other chronic pain: Secondary | ICD-10-CM | POA: Diagnosis not present

## 2020-03-20 DIAGNOSIS — G8929 Other chronic pain: Secondary | ICD-10-CM | POA: Diagnosis not present

## 2020-03-21 DIAGNOSIS — G8929 Other chronic pain: Secondary | ICD-10-CM | POA: Diagnosis not present

## 2020-03-22 DIAGNOSIS — G8929 Other chronic pain: Secondary | ICD-10-CM | POA: Diagnosis not present

## 2020-03-22 DIAGNOSIS — E230 Hypopituitarism: Secondary | ICD-10-CM | POA: Diagnosis not present

## 2020-03-22 DIAGNOSIS — Z8546 Personal history of malignant neoplasm of prostate: Secondary | ICD-10-CM | POA: Diagnosis not present

## 2020-03-22 DIAGNOSIS — R310 Gross hematuria: Secondary | ICD-10-CM | POA: Diagnosis not present

## 2020-03-25 DIAGNOSIS — G8929 Other chronic pain: Secondary | ICD-10-CM | POA: Diagnosis not present

## 2020-03-26 DIAGNOSIS — G8929 Other chronic pain: Secondary | ICD-10-CM | POA: Diagnosis not present

## 2020-03-27 DIAGNOSIS — G8929 Other chronic pain: Secondary | ICD-10-CM | POA: Diagnosis not present

## 2020-03-28 DIAGNOSIS — G8929 Other chronic pain: Secondary | ICD-10-CM | POA: Diagnosis not present

## 2020-03-29 ENCOUNTER — Ambulatory Visit: Payer: Medicaid Other | Admitting: Cardiology

## 2020-03-29 DIAGNOSIS — G8929 Other chronic pain: Secondary | ICD-10-CM | POA: Diagnosis not present
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
## 2020-04-01 DIAGNOSIS — G8929 Other chronic pain: Secondary | ICD-10-CM | POA: Diagnosis not present

## 2020-04-02 DIAGNOSIS — G8929 Other chronic pain: Secondary | ICD-10-CM | POA: Diagnosis not present

## 2020-04-03 ENCOUNTER — Encounter: Payer: Self-pay | Admitting: Registered Nurse

## 2020-04-03 ENCOUNTER — Other Ambulatory Visit: Payer: Self-pay

## 2020-04-03 ENCOUNTER — Encounter: Payer: Medicaid Other | Attending: Registered Nurse | Admitting: Registered Nurse

## 2020-04-03 VITALS — BP 122/81 | HR 63 | Temp 98.4°F | Ht 75.0 in | Wt 295.0 lb

## 2020-04-03 DIAGNOSIS — M25561 Pain in right knee: Secondary | ICD-10-CM | POA: Diagnosis not present

## 2020-04-03 DIAGNOSIS — Z5181 Encounter for therapeutic drug level monitoring: Secondary | ICD-10-CM | POA: Diagnosis not present

## 2020-04-03 DIAGNOSIS — G894 Chronic pain syndrome: Secondary | ICD-10-CM | POA: Diagnosis not present

## 2020-04-03 DIAGNOSIS — Z79899 Other long term (current) drug therapy: Secondary | ICD-10-CM | POA: Diagnosis not present

## 2020-04-03 DIAGNOSIS — M25562 Pain in left knee: Secondary | ICD-10-CM | POA: Insufficient documentation

## 2020-04-03 DIAGNOSIS — Z79891 Long term (current) use of opiate analgesic: Secondary | ICD-10-CM | POA: Diagnosis not present

## 2020-04-03 DIAGNOSIS — M5416 Radiculopathy, lumbar region: Secondary | ICD-10-CM | POA: Diagnosis not present

## 2020-04-03 DIAGNOSIS — M961 Postlaminectomy syndrome, not elsewhere classified: Secondary | ICD-10-CM | POA: Insufficient documentation

## 2020-04-03 DIAGNOSIS — G8929 Other chronic pain: Secondary | ICD-10-CM | POA: Insufficient documentation
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
## 2020-04-04 DIAGNOSIS — G8929 Other chronic pain: Secondary | ICD-10-CM | POA: Diagnosis not present

## 2020-04-05 DIAGNOSIS — G8929 Other chronic pain: Secondary | ICD-10-CM | POA: Diagnosis not present

## 2020-04-08 DIAGNOSIS — G8929 Other chronic pain: Secondary | ICD-10-CM | POA: Diagnosis not present

## 2020-04-10 DIAGNOSIS — G8929 Other chronic pain: Secondary | ICD-10-CM | POA: Diagnosis not present

## 2020-04-11 DIAGNOSIS — G8929 Other chronic pain: Secondary | ICD-10-CM | POA: Diagnosis not present

## 2020-04-12 DIAGNOSIS — G8929 Other chronic pain: Secondary | ICD-10-CM | POA: Diagnosis not present

## 2020-04-15 DIAGNOSIS — G8929 Other chronic pain: Secondary | ICD-10-CM | POA: Diagnosis not present

## 2020-04-16 DIAGNOSIS — G8929 Other chronic pain: Secondary | ICD-10-CM | POA: Diagnosis not present

## 2020-04-17 DIAGNOSIS — G8929 Other chronic pain: Secondary | ICD-10-CM | POA: Diagnosis not present

## 2020-04-18 DIAGNOSIS — G8929 Other chronic pain: Secondary | ICD-10-CM | POA: Diagnosis not present

## 2020-04-19 DIAGNOSIS — G8929 Other chronic pain: Secondary | ICD-10-CM | POA: Diagnosis not present

## 2020-04-22 DIAGNOSIS — G8929 Other chronic pain: Secondary | ICD-10-CM | POA: Diagnosis not present

## 2020-04-23 DIAGNOSIS — G8929 Other chronic pain: Secondary | ICD-10-CM | POA: Diagnosis not present

## 2020-04-25 DIAGNOSIS — G8929 Other chronic pain: Secondary | ICD-10-CM | POA: Diagnosis not present

## 2020-04-26 DIAGNOSIS — G8929 Other chronic pain: Secondary | ICD-10-CM | POA: Diagnosis not present

## 2020-04-29 ENCOUNTER — Other Ambulatory Visit: Payer: Self-pay | Admitting: Family Medicine

## 2020-04-29 DIAGNOSIS — G8929 Other chronic pain: Secondary | ICD-10-CM

## 2020-04-29 DIAGNOSIS — M545 Low back pain, unspecified: Secondary | ICD-10-CM

## 2020-04-30 DIAGNOSIS — G8929 Other chronic pain: Secondary | ICD-10-CM | POA: Diagnosis not present

## 2020-05-01 DIAGNOSIS — G8929 Other chronic pain: Secondary | ICD-10-CM | POA: Diagnosis not present

## 2020-05-02 DIAGNOSIS — G8929 Other chronic pain: Secondary | ICD-10-CM | POA: Diagnosis not present

## 2020-05-03 DIAGNOSIS — G8929 Other chronic pain: Secondary | ICD-10-CM | POA: Diagnosis not present

## 2020-05-06 ENCOUNTER — Ambulatory Visit: Payer: Medicaid Other | Admitting: Cardiology

## 2020-05-06 ENCOUNTER — Institutional Professional Consult (permissible substitution): Payer: Medicaid Other | Admitting: Diagnostic Neuroimaging

## 2020-05-06 ENCOUNTER — Telehealth: Payer: Self-pay | Admitting: *Deleted

## 2020-05-06 ENCOUNTER — Encounter: Payer: Self-pay | Admitting: Diagnostic Neuroimaging

## 2020-05-06 DIAGNOSIS — G8929 Other chronic pain: Secondary | ICD-10-CM | POA: Diagnosis not present

## 2020-05-06 NOTE — Telephone Encounter (Signed)
Patient was no show for new patient appointment today. 
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
## 2020-05-07 DIAGNOSIS — G8929 Other chronic pain: Secondary | ICD-10-CM | POA: Diagnosis not present

## 2020-05-08 DIAGNOSIS — G8929 Other chronic pain: Secondary | ICD-10-CM | POA: Diagnosis not present

## 2020-05-09 DIAGNOSIS — G8929 Other chronic pain: Secondary | ICD-10-CM | POA: Diagnosis not present

## 2020-05-21 ENCOUNTER — Other Ambulatory Visit: Payer: Self-pay | Admitting: Family Medicine

## 2020-05-21 DIAGNOSIS — I6522 Occlusion and stenosis of left carotid artery: Secondary | ICD-10-CM

## 2020-05-23 DIAGNOSIS — G8929 Other chronic pain: Secondary | ICD-10-CM | POA: Diagnosis not present

## 2020-05-24 DIAGNOSIS — G8929 Other chronic pain: Secondary | ICD-10-CM | POA: Diagnosis not present

## 2020-05-28 DIAGNOSIS — G8929 Other chronic pain: Secondary | ICD-10-CM | POA: Diagnosis not present

## 2020-05-30 DIAGNOSIS — G8929 Other chronic pain: Secondary | ICD-10-CM | POA: Diagnosis not present

## 2020-06-03 DIAGNOSIS — G8929 Other chronic pain: Secondary | ICD-10-CM | POA: Diagnosis not present

## 2020-06-04 DIAGNOSIS — G8929 Other chronic pain: Secondary | ICD-10-CM | POA: Diagnosis not present

## 2020-06-06 ENCOUNTER — Other Ambulatory Visit: Payer: Self-pay | Admitting: Family Medicine

## 2020-06-06 DIAGNOSIS — G8929 Other chronic pain: Secondary | ICD-10-CM

## 2020-06-06 DIAGNOSIS — M545 Low back pain, unspecified: Secondary | ICD-10-CM

## 2020-06-07 DIAGNOSIS — G8929 Other chronic pain: Secondary | ICD-10-CM | POA: Diagnosis not present

## 2020-06-19 DIAGNOSIS — G8929 Other chronic pain: Secondary | ICD-10-CM | POA: Diagnosis not present

## 2020-07-03 DIAGNOSIS — G8929 Other chronic pain: Secondary | ICD-10-CM | POA: Diagnosis not present

## 2020-07-04 DIAGNOSIS — G8929 Other chronic pain: Secondary | ICD-10-CM | POA: Diagnosis not present

## 2020-07-05 DIAGNOSIS — G8929 Other chronic pain: Secondary | ICD-10-CM | POA: Diagnosis not present

## 2020-07-21 ENCOUNTER — Other Ambulatory Visit: Payer: Self-pay | Admitting: Family Medicine

## 2020-07-21 DIAGNOSIS — R457 State of emotional shock and stress, unspecified: Secondary | ICD-10-CM | POA: Diagnosis not present

## 2020-07-21 DIAGNOSIS — R569 Unspecified convulsions: Secondary | ICD-10-CM | POA: Diagnosis not present

## 2020-07-21 DIAGNOSIS — G4700 Insomnia, unspecified: Secondary | ICD-10-CM

## 2020-07-21 DIAGNOSIS — R21 Rash and other nonspecific skin eruption: Secondary | ICD-10-CM | POA: Diagnosis not present

## 2020-07-21 DIAGNOSIS — R4182 Altered mental status, unspecified: Secondary | ICD-10-CM | POA: Diagnosis not present

## 2020-07-21 DIAGNOSIS — E1165 Type 2 diabetes mellitus with hyperglycemia: Secondary | ICD-10-CM | POA: Diagnosis not present

## 2020-07-24 ENCOUNTER — Telehealth: Payer: Self-pay

## 2020-07-24 NOTE — Telephone Encounter (Signed)
Appointment scheduled.

## 2020-08-02 ENCOUNTER — Ambulatory Visit: Payer: No Typology Code available for payment source | Admitting: Family Medicine

## 2020-08-05 ENCOUNTER — Encounter: Payer: Self-pay | Admitting: Family Medicine

## 2020-08-05 ENCOUNTER — Ambulatory Visit: Payer: Medicaid Other | Admitting: Family Medicine

## 2020-08-20 ENCOUNTER — Encounter: Payer: Self-pay | Admitting: Family Medicine

## 2020-08-20 ENCOUNTER — Telehealth: Payer: Self-pay

## 2020-08-20 ENCOUNTER — Ambulatory Visit: Payer: No Typology Code available for payment source | Admitting: Family Medicine

## 2020-08-20 ENCOUNTER — Other Ambulatory Visit: Payer: Self-pay

## 2020-08-20 VITALS — BP 136/82 | HR 60 | Temp 97.6°F | Ht 75.0 in | Wt 273.6 lb

## 2020-08-20 DIAGNOSIS — M549 Dorsalgia, unspecified: Secondary | ICD-10-CM | POA: Diagnosis not present

## 2020-08-20 DIAGNOSIS — E785 Hyperlipidemia, unspecified: Secondary | ICD-10-CM

## 2020-08-20 DIAGNOSIS — L309 Dermatitis, unspecified: Secondary | ICD-10-CM | POA: Diagnosis not present

## 2020-08-20 DIAGNOSIS — E119 Type 2 diabetes mellitus without complications: Secondary | ICD-10-CM

## 2020-08-20 DIAGNOSIS — I6522 Occlusion and stenosis of left carotid artery: Secondary | ICD-10-CM | POA: Diagnosis not present

## 2020-08-20 DIAGNOSIS — M545 Low back pain, unspecified: Secondary | ICD-10-CM | POA: Diagnosis not present

## 2020-08-20 DIAGNOSIS — I10 Essential (primary) hypertension: Secondary | ICD-10-CM

## 2020-08-20 DIAGNOSIS — G4700 Insomnia, unspecified: Secondary | ICD-10-CM

## 2020-08-20 DIAGNOSIS — I480 Paroxysmal atrial fibrillation: Secondary | ICD-10-CM | POA: Diagnosis not present

## 2020-08-20 DIAGNOSIS — K219 Gastro-esophageal reflux disease without esophagitis: Secondary | ICD-10-CM

## 2020-08-20 DIAGNOSIS — G8929 Other chronic pain: Secondary | ICD-10-CM

## 2020-08-20 LAB — BAYER DCA HB A1C WAIVED: HB A1C (BAYER DCA - WAIVED): 10.5 % — ABNORMAL HIGH (ref <7.0)

## 2020-08-20 MED ORDER — APIXABAN 5 MG PO TABS: 5.0000 mg | ORAL_TABLET | Freq: Two times a day (BID) | ORAL | 1 refills | Status: DC

## 2020-08-20 MED ORDER — OMEPRAZOLE 20 MG PO CPDR: DELAYED_RELEASE_CAPSULE | ORAL | 1 refills | Status: DC

## 2020-08-20 MED ORDER — BLOOD GLUCOSE MONITOR KIT: PACK | 11 refills | Status: DC

## 2020-08-20 MED ORDER — GABAPENTIN 300 MG PO CAPS: 300.0000 mg | ORAL_CAPSULE | Freq: Three times a day (TID) | ORAL | 2 refills | Status: DC

## 2020-08-20 MED ORDER — ZOLPIDEM TARTRATE 10 MG PO TABS: 10.0000 mg | ORAL_TABLET | Freq: Every day | ORAL | 1 refills | Status: DC

## 2020-08-20 MED ORDER — MOMETASONE FUROATE 0.1 % EX CREA: 1.0000 "application " | TOPICAL_CREAM | Freq: Every day | CUTANEOUS | 5 refills | Status: DC

## 2020-08-20 MED ORDER — METOPROLOL SUCCINATE ER 50 MG PO TB24
ORAL_TABLET | ORAL | 1 refills | Status: DC
Start: 2020-08-20 — End: 2021-03-13

## 2020-08-20 MED ORDER — ALBUTEROL SULFATE HFA 108 (90 BASE) MCG/ACT IN AERS: INHALATION_SPRAY | RESPIRATORY_TRACT | 5 refills | Status: DC

## 2020-08-20 MED ORDER — METFORMIN HCL ER 750 MG PO TB24: 750.0000 mg | ORAL_TABLET | Freq: Every day | ORAL | 2 refills | Status: DC

## 2020-08-20 MED ORDER — ATORVASTATIN CALCIUM 40 MG PO TABS: 40.0000 mg | ORAL_TABLET | Freq: Every day | ORAL | 1 refills | Status: DC

## 2020-08-20 MED ORDER — TRAZODONE HCL 150 MG PO TABS
300.0000 mg | ORAL_TABLET | Freq: Every day | ORAL | 3 refills | Status: DC
Start: 2020-08-20 — End: 2021-03-14

## 2020-08-20 NOTE — Addendum Note (Signed)
Addended by: Claretta Fraise on: 08/20/2020 10:39 PM   Modules accepted: Orders, Level of Service

## 2020-08-20 NOTE — Telephone Encounter (Signed)
Zolpidem sent through Covermymeds today.  Insomnia  (Key: [id number])  This request has received a Favorable outcome.  Please note any additional information provided by [employer] [facility] at the bottom of this request.
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
## 2020-08-21 ENCOUNTER — Other Ambulatory Visit: Payer: Self-pay | Admitting: Family Medicine

## 2020-08-21 LAB — MICROALBUMIN / CREATININE URINE RATIO
Creatinine, Urine: 313.4 mg/dL
Microalb/Creat Ratio: 4 mg/g creat (ref 0–29)
Microalbumin, Urine: 12.6 ug/mL

## 2020-08-21 LAB — LIPID PANEL
Chol/HDL Ratio: 5.6 ratio — ABNORMAL HIGH (ref 0.0–5.0)
Cholesterol, Total: 141 mg/dL (ref 100–199)
HDL: 25 mg/dL — ABNORMAL LOW (ref >39)
LDL Chol Calc (NIH): 71 mg/dL (ref 0–99)
Triglycerides: 276 mg/dL — ABNORMAL HIGH (ref 0–149)
VLDL Cholesterol Cal: 45 mg/dL — ABNORMAL HIGH (ref 5–40)

## 2020-08-21 LAB — CBC WITH DIFFERENTIAL/PLATELET
Basophils Absolute: 0.1 10*3/uL (ref 0.0–0.2)
Basos: 2 %
EOS (ABSOLUTE): 0.4 10*3/uL (ref 0.0–0.4)
Eos: 6 %
Hematocrit: 40.8 % (ref 37.5–51.0)
Hemoglobin: 13.8 g/dL (ref 13.0–17.7)
Immature Grans (Abs): 0 10*3/uL (ref 0.0–0.1)
Immature Granulocytes: 0 %
Lymphocytes Absolute: 1.7 10*3/uL (ref 0.7–3.1)
Lymphs: 28 %
MCH: 31.2 pg (ref 26.6–33.0)
MCHC: 33.8 g/dL (ref 31.5–35.7)
MCV: 92 fL (ref 79–97)
Monocytes Absolute: 0.5 10*3/uL (ref 0.1–0.9)
Monocytes: 8 %
Neutrophils Absolute: 3.4 10*3/uL (ref 1.4–7.0)
Neutrophils: 56 %
Platelets: 239 10*3/uL (ref 150–450)
RBC: 4.43 x10E6/uL (ref 4.14–5.80)
RDW: 13.1 % (ref 11.6–15.4)
WBC: 6 10*3/uL (ref 3.4–10.8)

## 2020-08-21 LAB — CMP14+EGFR
ALT: 23 IU/L (ref 0–44)
AST: 16 IU/L (ref 0–40)
Albumin/Globulin Ratio: 1.5 (ref 1.2–2.2)
Albumin: 3.8 g/dL (ref 3.8–4.9)
Alkaline Phosphatase: 70 IU/L (ref 44–121)
BUN/Creatinine Ratio: 16 (ref 10–24)
BUN: 17 mg/dL (ref 8–27)
Bilirubin Total: 0.2 mg/dL (ref 0.0–1.2)
CO2: 24 mmol/L (ref 20–29)
Calcium: 9 mg/dL (ref 8.6–10.2)
Chloride: 103 mmol/L (ref 96–106)
Creatinine, Ser: 1.08 mg/dL (ref 0.76–1.27)
GFR calc Af Amer: 86 mL/min/{1.73_m2} (ref >59)
GFR calc non Af Amer: 74 mL/min/{1.73_m2} (ref >59)
Globulin, Total: 2.5 g/dL (ref 1.5–4.5)
Glucose: 152 mg/dL — ABNORMAL HIGH (ref 65–99)
Potassium: 4 mmol/L (ref 3.5–5.2)
Sodium: 140 mmol/L (ref 134–144)
Total Protein: 6.3 g/dL (ref 6.0–8.5)

## 2020-08-21 MED ORDER — LISINOPRIL 20 MG PO TABS: 20.0000 mg | ORAL_TABLET | Freq: Every day | ORAL | 3 refills | Status: DC

## 2020-08-21 NOTE — Telephone Encounter (Signed)
Approved on January 25 PA Case: [id number], Status: Approved, Coverage Starts on: 08/20/2020 12:00:00 AM, Coverage Ends on: 02/16/2021 12:00:00 AM.  Pharmacy aware.

## 2020-08-23 DIAGNOSIS — G8929 Other chronic pain: Secondary | ICD-10-CM | POA: Diagnosis not present

## 2020-08-26 DIAGNOSIS — G8929 Other chronic pain: Secondary | ICD-10-CM | POA: Diagnosis not present

## 2020-08-27 DIAGNOSIS — G8929 Other chronic pain: Secondary | ICD-10-CM | POA: Diagnosis not present

## 2020-08-28 ENCOUNTER — Telehealth: Payer: Self-pay

## 2020-08-28 DIAGNOSIS — G8929 Other chronic pain: Secondary | ICD-10-CM | POA: Diagnosis not present

## 2020-08-29 DIAGNOSIS — G8929 Other chronic pain: Secondary | ICD-10-CM | POA: Diagnosis not present

## 2020-08-30 DIAGNOSIS — G8929 Other chronic pain: Secondary | ICD-10-CM | POA: Diagnosis not present

## 2020-09-02 DIAGNOSIS — G8929 Other chronic pain: Secondary | ICD-10-CM | POA: Diagnosis not present

## 2020-09-03 DIAGNOSIS — G8929 Other chronic pain: Secondary | ICD-10-CM | POA: Diagnosis not present

## 2020-09-04 DIAGNOSIS — G8929 Other chronic pain: Secondary | ICD-10-CM | POA: Diagnosis not present

## 2020-09-04 NOTE — Telephone Encounter (Signed)
Form looked at this morning, I talked with Calvin Aguirre last month and was informed that we did not do these. The manager at [facility] care said that these came from the PCP and that the patient was enrolled by Dr. Livia Snellen on 06/04/2019.  TC to Calvin Aguirre to get clarification again, pt now has [facility] managed care and [facility] does not manage these services any longer.

## 2020-09-05 DIAGNOSIS — G8929 Other chronic pain: Secondary | ICD-10-CM | POA: Diagnosis not present

## 2020-09-06 ENCOUNTER — Ambulatory Visit: Payer: Medicaid Other | Admitting: Dietician

## 2020-09-06 DIAGNOSIS — G8929 Other chronic pain: Secondary | ICD-10-CM | POA: Diagnosis not present

## 2020-09-09 DIAGNOSIS — G8929 Other chronic pain: Secondary | ICD-10-CM | POA: Diagnosis not present

## 2020-09-10 DIAGNOSIS — G8929 Other chronic pain: Secondary | ICD-10-CM | POA: Diagnosis not present

## 2020-09-10 NOTE — Telephone Encounter (Signed)
Form placed on providers desk 

## 2020-09-11 DIAGNOSIS — G8929 Other chronic pain: Secondary | ICD-10-CM | POA: Diagnosis not present

## 2020-09-12 DIAGNOSIS — G8929 Other chronic pain: Secondary | ICD-10-CM | POA: Diagnosis not present

## 2020-09-13 DIAGNOSIS — G8929 Other chronic pain: Secondary | ICD-10-CM | POA: Diagnosis not present

## 2020-09-13 NOTE — Telephone Encounter (Signed)
Form faxed

## 2020-09-16 DIAGNOSIS — G8929 Other chronic pain: Secondary | ICD-10-CM | POA: Diagnosis not present

## 2020-09-17 DIAGNOSIS — G8929 Other chronic pain: Secondary | ICD-10-CM | POA: Diagnosis not present

## 2020-09-18 DIAGNOSIS — G8929 Other chronic pain: Secondary | ICD-10-CM | POA: Diagnosis not present

## 2020-09-19 ENCOUNTER — Encounter: Payer: Self-pay | Admitting: Family Medicine

## 2020-09-19 ENCOUNTER — Ambulatory Visit: Payer: No Typology Code available for payment source | Admitting: Family Medicine

## 2020-09-19 ENCOUNTER — Other Ambulatory Visit: Payer: Self-pay

## 2020-09-19 VITALS — BP 119/77 | HR 89 | Temp 97.5°F | Resp 20 | Ht 75.0 in | Wt 267.4 lb

## 2020-09-19 DIAGNOSIS — F411 Generalized anxiety disorder: Secondary | ICD-10-CM

## 2020-09-19 DIAGNOSIS — G8929 Other chronic pain: Secondary | ICD-10-CM | POA: Diagnosis not present

## 2020-09-19 DIAGNOSIS — R569 Unspecified convulsions: Secondary | ICD-10-CM | POA: Diagnosis not present
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
## 2020-09-20 DIAGNOSIS — G8929 Other chronic pain: Secondary | ICD-10-CM | POA: Diagnosis not present

## 2020-09-22 ENCOUNTER — Encounter: Payer: Self-pay | Admitting: Family Medicine

## 2020-09-22 MED ORDER — DULOXETINE HCL 60 MG PO CPEP
60.0000 mg | ORAL_CAPSULE | Freq: Every day | ORAL | 1 refills | Status: DC
Start: 2020-09-22 — End: 2021-03-14

## 2020-09-22 MED ORDER — CYCLOBENZAPRINE HCL 10 MG PO TABS: 10.0000 mg | ORAL_TABLET | Freq: Three times a day (TID) | ORAL | 0 refills | Status: DC

## 2020-09-23 DIAGNOSIS — G8929 Other chronic pain: Secondary | ICD-10-CM | POA: Diagnosis not present

## 2020-09-24 DIAGNOSIS — G8929 Other chronic pain: Secondary | ICD-10-CM | POA: Diagnosis not present

## 2020-09-25 DIAGNOSIS — G8929 Other chronic pain: Secondary | ICD-10-CM | POA: Diagnosis not present

## 2020-09-26 DIAGNOSIS — G8929 Other chronic pain: Secondary | ICD-10-CM | POA: Diagnosis not present

## 2020-09-27 DIAGNOSIS — G8929 Other chronic pain: Secondary | ICD-10-CM | POA: Diagnosis not present

## 2020-09-30 DIAGNOSIS — G8929 Other chronic pain: Secondary | ICD-10-CM | POA: Diagnosis not present

## 2020-10-01 DIAGNOSIS — G8929 Other chronic pain: Secondary | ICD-10-CM | POA: Diagnosis not present

## 2020-10-02 DIAGNOSIS — G8929 Other chronic pain: Secondary | ICD-10-CM | POA: Diagnosis not present

## 2020-10-03 ENCOUNTER — Encounter (HOSPITAL_COMMUNITY): Payer: Self-pay | Admitting: *Deleted

## 2020-10-03 ENCOUNTER — Emergency Department (HOSPITAL_COMMUNITY)
Admission: EM | Admit: 2020-10-03 | Discharge: 2020-10-03 | Disposition: A | Discharge status: Home / Self Care | Payer: Medicaid Other | Attending: Emergency Medicine | Admitting: Emergency Medicine

## 2020-10-03 ENCOUNTER — Emergency Department (HOSPITAL_COMMUNITY): Payer: Medicaid Other

## 2020-10-03 DIAGNOSIS — F17210 Nicotine dependence, cigarettes, uncomplicated: Secondary | ICD-10-CM | POA: Insufficient documentation

## 2020-10-03 DIAGNOSIS — I10 Essential (primary) hypertension: Secondary | ICD-10-CM | POA: Insufficient documentation

## 2020-10-03 DIAGNOSIS — S82831A Other fracture of upper and lower end of right fibula, initial encounter for closed fracture: Secondary | ICD-10-CM | POA: Diagnosis not present

## 2020-10-03 DIAGNOSIS — Z85828 Personal history of other malignant neoplasm of skin: Secondary | ICD-10-CM | POA: Insufficient documentation

## 2020-10-03 DIAGNOSIS — G8929 Other chronic pain: Secondary | ICD-10-CM | POA: Diagnosis not present

## 2020-10-03 DIAGNOSIS — I4510 Unspecified right bundle-branch block: Secondary | ICD-10-CM | POA: Diagnosis not present

## 2020-10-03 DIAGNOSIS — Z79899 Other long term (current) drug therapy: Secondary | ICD-10-CM | POA: Diagnosis not present

## 2020-10-03 DIAGNOSIS — S82891A Other fracture of right lower leg, initial encounter for closed fracture: Secondary | ICD-10-CM

## 2020-10-03 DIAGNOSIS — Z8546 Personal history of malignant neoplasm of prostate: Secondary | ICD-10-CM | POA: Diagnosis not present

## 2020-10-03 DIAGNOSIS — W19XXXA Unspecified fall, initial encounter: Secondary | ICD-10-CM | POA: Insufficient documentation

## 2020-10-03 DIAGNOSIS — S0990XA Unspecified injury of head, initial encounter: Secondary | ICD-10-CM | POA: Diagnosis not present

## 2020-10-03 DIAGNOSIS — I4891 Unspecified atrial fibrillation: Secondary | ICD-10-CM | POA: Insufficient documentation

## 2020-10-03 DIAGNOSIS — S82391A Other fracture of lower end of right tibia, initial encounter for closed fracture: Secondary | ICD-10-CM | POA: Diagnosis not present

## 2020-10-03 DIAGNOSIS — R55 Syncope and collapse: Secondary | ICD-10-CM | POA: Diagnosis not present

## 2020-10-03 DIAGNOSIS — Z8673 Personal history of transient ischemic attack (TIA), and cerebral infarction without residual deficits: Secondary | ICD-10-CM | POA: Insufficient documentation

## 2020-10-03 DIAGNOSIS — M7989 Other specified soft tissue disorders: Secondary | ICD-10-CM | POA: Diagnosis not present

## 2020-10-03 DIAGNOSIS — Z7901 Long term (current) use of anticoagulants: Secondary | ICD-10-CM | POA: Insufficient documentation

## 2020-10-03 DIAGNOSIS — S199XXA Unspecified injury of neck, initial encounter: Secondary | ICD-10-CM | POA: Diagnosis not present

## 2020-10-03 DIAGNOSIS — S99911A Unspecified injury of right ankle, initial encounter: Secondary | ICD-10-CM | POA: Diagnosis present

## 2020-10-03 LAB — BASIC METABOLIC PANEL
Anion gap: 8 (ref 5–15)
BUN: 13 mg/dL (ref 8–23)
CO2: 28 mmol/L (ref 22–32)
Calcium: 9.3 mg/dL (ref 8.9–10.3)
Chloride: 104 mmol/L (ref 98–111)
Creatinine, Ser: 1.06 mg/dL (ref 0.61–1.24)
GFR, Estimated: >60 mL/min (ref >60)
Glucose, Bld: 112 mg/dL — ABNORMAL HIGH (ref 70–99)
Potassium: 3.8 mmol/L (ref 3.5–5.1)
Sodium: 140 mmol/L (ref 135–145)

## 2020-10-03 LAB — CBC WITH DIFFERENTIAL/PLATELET
Abs Immature Granulocytes: 0.01 10*3/uL (ref 0.00–0.07)
Basophils Absolute: 0.1 10*3/uL (ref 0.0–0.1)
Basophils Relative: 2 %
Eosinophils Absolute: 0.6 10*3/uL — ABNORMAL HIGH (ref 0.0–0.5)
Eosinophils Relative: 9 %
HCT: 37.8 % — ABNORMAL LOW (ref 39.0–52.0)
Hemoglobin: 13 g/dL (ref 13.0–17.0)
Immature Granulocytes: 0 %
Lymphocytes Relative: 22 %
Lymphs Abs: 1.5 10*3/uL (ref 0.7–4.0)
MCH: 32.6 pg (ref 26.0–34.0)
MCHC: 34.4 g/dL (ref 30.0–36.0)
MCV: 94.7 fL (ref 80.0–100.0)
Monocytes Absolute: 0.9 10*3/uL (ref 0.1–1.0)
Monocytes Relative: 13 %
Neutro Abs: 3.6 10*3/uL (ref 1.7–7.7)
Neutrophils Relative %: 54 %
Platelets: 191 10*3/uL (ref 150–400)
RBC: 3.99 MIL/uL — ABNORMAL LOW (ref 4.22–5.81)
RDW: 14.2 % (ref 11.5–15.5)
WBC: 6.6 10*3/uL (ref 4.0–10.5)
nRBC: 0 % (ref 0.0–0.2)

## 2020-10-03 LAB — TROPONIN I (HIGH SENSITIVITY)
Troponin I (High Sensitivity): 5 ng/L (ref <18)
Troponin I (High Sensitivity): 5 ng/L (ref <18)
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
## 2020-10-03 NOTE — Discharge Instructions (Addendum)
You have a fracture noted at the end of your fibula.  I placed you in a cam boot please wear during the day and you may take off at nighttime.  I recommend over-the-counter pain medications like ibuprofen and or Tylenol every 6 hours as needed please follow dosing on the back of bottle.  When at rest please keep the foot elevated and apply ice to the area as this will decrease inflammation and swelling.  I would like you to follow-up with orthopedic surgery for further evaluation.  Please call the number above.  Come back to the emergency department if you develop chest pain, shortness of breath, severe abdominal pain, uncontrolled nausea, vomiting, diarrhea.

## 2020-10-03 NOTE — ED Triage Notes (Signed)
Fell 3 days ago, states he had a syncopal episode prior to fall, states he has had black out spells for over 6 months.

## 2020-10-04 ENCOUNTER — Telehealth: Payer: Self-pay

## 2020-10-04 DIAGNOSIS — G8929 Other chronic pain: Secondary | ICD-10-CM | POA: Diagnosis not present
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
## 2020-10-07 ENCOUNTER — Telehealth: Payer: Self-pay

## 2020-10-07 NOTE — Telephone Encounter (Signed)
Caryl Pina, RN with [facility], called stating that they are still waiting to receive forms for pt regarding PA for personal care services.  Can fax to [phone]

## 2020-10-08 ENCOUNTER — Ambulatory Visit: Payer: Medicaid Other | Admitting: Orthopedic Surgery

## 2020-10-09 DIAGNOSIS — G8929 Other chronic pain: Secondary | ICD-10-CM | POA: Diagnosis not present

## 2020-10-10 ENCOUNTER — Encounter: Payer: Self-pay | Admitting: Family Medicine

## 2020-10-10 DIAGNOSIS — G8929 Other chronic pain: Secondary | ICD-10-CM | POA: Diagnosis not present

## 2020-10-10 DIAGNOSIS — I69319 Unspecified symptoms and signs involving cognitive functions following cerebral infarction: Secondary | ICD-10-CM | POA: Insufficient documentation

## 2020-10-10 NOTE — Telephone Encounter (Signed)
Form is in process

## 2020-10-10 NOTE — Telephone Encounter (Signed)
Caryl Pina, RN with [facility], called stating that they are still waiting to receive forms for pt regarding PA for personal care services.  Can call Caryl Pina at [phone] Can fax forms to [phone]

## 2020-10-11 DIAGNOSIS — G8929 Other chronic pain: Secondary | ICD-10-CM | POA: Diagnosis not present

## 2020-10-14 DIAGNOSIS — G8929 Other chronic pain: Secondary | ICD-10-CM | POA: Diagnosis not present

## 2020-10-14 NOTE — Telephone Encounter (Signed)
Form faxed back to [facility] line [phone]

## 2020-10-15 ENCOUNTER — Encounter: Payer: Self-pay | Admitting: Orthopedic Surgery

## 2020-10-15 ENCOUNTER — Ambulatory Visit: Payer: Medicaid Other | Admitting: Orthopedic Surgery

## 2020-10-15 ENCOUNTER — Other Ambulatory Visit: Payer: Self-pay

## 2020-10-15 ENCOUNTER — Ambulatory Visit: Payer: Medicaid Other

## 2020-10-15 VITALS — BP 116/75 | HR 67 | Ht 75.0 in

## 2020-10-15 DIAGNOSIS — S82831A Other fracture of upper and lower end of right fibula, initial encounter for closed fracture: Secondary | ICD-10-CM

## 2020-10-15 DIAGNOSIS — M25571 Pain in right ankle and joints of right foot: Secondary | ICD-10-CM

## 2020-10-15 DIAGNOSIS — G8929 Other chronic pain: Secondary | ICD-10-CM | POA: Diagnosis not present

## 2020-10-15 NOTE — Patient Instructions (Signed)
Instructions  1.  You have sustained an ankle fracture that we can treat similar to an ankle sprain  2.  I encourage you to stay on your feet and gradually remove your walking boot.   3.  Below are some exercises that you can complete on your own to improve your symptoms.  4.  As an alternative, you can search for ankle sprain exercises online, and can see some demonstrations on YouTube  5.  If you are having difficulty with these exercises, we can also prescribe formal physical therapy  Ankle Exercises Ask your health care provider which exercises are safe for you. Do exercises exactly as told by your health care provider and adjust them as directed. It is normal to feel mild stretching, pulling, tightness, or mild discomfort as you do these exercises. Stop right away if you feel sudden pain or your pain gets worse. Do not begin these exercises until told by your health care provider.  Stretching and range-of-motion exercises These exercises warm up your muscles and joints and improve the movement and flexibility of your ankle. These exercises may also help to relieve pain.  Dorsiflexion/plantar flexion  1. Sit with your R knee straight or bent. Do not rest your foot on anything. 2. Flex your left ankle to tilt the top of your foot toward your shin. This is called dorsiflexion. 3. Hold this position for 5 seconds. 4. Point your toes downward to tilt the top of your foot away from your shin. This is called plantar flexion. 5. Hold this position for 5 seconds. Repeat 10 times. Complete this exercise 2-3 times a day.  As tolerated  Ankle alphabet  1. Sit with your R foot supported at your lower leg. ? Do not rest your foot on anything. ? Make sure your foot has room to move freely. 2. Think of your R foot as a paintbrush: ? Move your foot to trace each letter of the alphabet in the air. Keep your hip and knee still while you trace the letters. Trace every letter from A to Z. ? Make the  letters as large as you can without causing or increasing any discomfort.  Repeat 2-3 times. Complete this exercise 2-3 times a day.   Strengthening exercises These exercises build strength and endurance in your ankle. Endurance is the ability to use your muscles for a long time, even after they get tired. Dorsiflexors These are muscles that lift your foot up. 1. Secure a rubber exercise band or tube to an object, such as a table leg, that will stay still when the band is pulled. Secure the other end around your R foot. 2. Sit on the floor, facing the object with your R leg extended. The band or tube should be slightly tense when your foot is relaxed. 3. Slowly flex your R ankle and toes to bring your foot toward your shin. 4. Hold this position for 5 seconds. 5. Slowly return your foot to the starting position, controlling the band as you do that. Repeat 10 times. Complete this exercise 2-3 times a day.  Plantar flexors These are muscles that push your foot down. 1. Sit on the floor with your R leg extended. 2. Loop a rubber exercise band or tube around the ball of your R foot. The ball of your foot is on the walking surface, right under your toes. The band or tube should be slightly tense when your foot is relaxed. 3. Slowly point your toes downward, pushing them  away from you. 4. Hold this position for 5 seconds. 5. Slowly release the tension in the band or tube, controlling smoothly until your foot is back in the starting position. Repeat 10 times. Complete this exercise 2-3 times a day.  Towel curls  1. Sit in a chair on a non-carpeted surface, and put your feet on the floor. 2. Place a towel in front of your feet. 3. Keeping your heel on the floor, put your R foot on the towel. 4. Pull the towel toward you by grabbing the towel with your toes and curling them under. Keep your heel on the floor. 5. Let your toes relax. 6. Grab the towel again. Keep pulling the towel until it is  completely underneath your foot. Repeat 10 times. Complete this exercise 2-3 times a day.  Standing plantar flexion This is an exercise in which you use your toes to lift your body's weight while standing. 1. Stand with your feet shoulder-width apart. 2. Keep your weight spread evenly over the width of your feet while you rise up on your toes. Use a wall or table to steady yourself if needed, but try not to use it for support. 3. If this exercise is too easy, try these options: ? Shift your weight toward your R leg until you feel challenged. ? If told by your health care provider, lift your uninjured leg off the floor. 4. Hold this position for 5 seconds. Repeat 10 times. Complete this exercise 2-3 times a day.  Tandem walking 1. Stand with one foot directly in front of the other. 2. Slowly raise your back foot up, lifting your heel before your toes, and place it directly in front of your other foot. 3. Continue to walk in this heel-to-toe way. Have a countertop or wall nearby to use if needed to keep your balance, but try not to hold onto anything for support.  Repeat 10 times. Complete this exercise 2-3 times a day.   Document Revised: 04/09/2018 Document Reviewed: 04/11/2018 Elsevier Patient Education  [place].
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
## 2020-10-16 DIAGNOSIS — G8929 Other chronic pain: Secondary | ICD-10-CM | POA: Diagnosis not present

## 2020-10-17 DIAGNOSIS — G8929 Other chronic pain: Secondary | ICD-10-CM | POA: Diagnosis not present

## 2020-10-18 DIAGNOSIS — G8929 Other chronic pain: Secondary | ICD-10-CM | POA: Diagnosis not present

## 2020-10-21 DIAGNOSIS — G8929 Other chronic pain: Secondary | ICD-10-CM | POA: Diagnosis not present

## 2020-10-23 ENCOUNTER — Institutional Professional Consult (permissible substitution): Payer: Medicaid Other | Admitting: Diagnostic Neuroimaging

## 2020-10-23 DIAGNOSIS — G8929 Other chronic pain: Secondary | ICD-10-CM | POA: Diagnosis not present

## 2020-10-24 DIAGNOSIS — G8929 Other chronic pain: Secondary | ICD-10-CM | POA: Diagnosis not present

## 2020-10-25 DIAGNOSIS — G8929 Other chronic pain: Secondary | ICD-10-CM | POA: Diagnosis not present

## 2020-10-28 DIAGNOSIS — G8929 Other chronic pain: Secondary | ICD-10-CM | POA: Diagnosis not present

## 2020-10-29 DIAGNOSIS — G8929 Other chronic pain: Secondary | ICD-10-CM | POA: Diagnosis not present

## 2020-10-30 DIAGNOSIS — G8929 Other chronic pain: Secondary | ICD-10-CM | POA: Diagnosis not present

## 2020-10-31 DIAGNOSIS — G8929 Other chronic pain: Secondary | ICD-10-CM | POA: Diagnosis not present

## 2020-11-04 ENCOUNTER — Institutional Professional Consult (permissible substitution): Payer: Medicaid Other | Admitting: Diagnostic Neuroimaging

## 2020-11-04 ENCOUNTER — Encounter: Payer: Self-pay | Admitting: Diagnostic Neuroimaging

## 2020-11-12 ENCOUNTER — Ambulatory Visit: Payer: Medicaid Other | Admitting: Orthopedic Surgery

## 2020-11-13 ENCOUNTER — Other Ambulatory Visit: Payer: Self-pay | Admitting: Family Medicine

## 2020-11-13 DIAGNOSIS — G8929 Other chronic pain: Secondary | ICD-10-CM | POA: Diagnosis not present

## 2020-11-14 DIAGNOSIS — G8929 Other chronic pain: Secondary | ICD-10-CM | POA: Diagnosis not present

## 2020-11-15 DIAGNOSIS — G8929 Other chronic pain: Secondary | ICD-10-CM | POA: Diagnosis not present

## 2020-11-18 DIAGNOSIS — G8929 Other chronic pain: Secondary | ICD-10-CM | POA: Diagnosis not present

## 2020-11-19 ENCOUNTER — Telehealth: Payer: Self-pay | Admitting: Orthopedic Surgery

## 2020-11-19 ENCOUNTER — Ambulatory Visit: Payer: Medicaid Other | Admitting: Orthopedic Surgery

## 2020-11-19 NOTE — Telephone Encounter (Signed)
Spoke with patient regarding today's scheduled appointment with Dr Amedeo Kinsman, which he was unable to attend. Patient again relayed

## 2020-11-27 DIAGNOSIS — G8929 Other chronic pain: Secondary | ICD-10-CM | POA: Diagnosis not present

## 2020-11-28 DIAGNOSIS — G8929 Other chronic pain: Secondary | ICD-10-CM | POA: Diagnosis not present

## 2020-11-29 DIAGNOSIS — G8929 Other chronic pain: Secondary | ICD-10-CM | POA: Diagnosis not present

## 2020-12-02 DIAGNOSIS — G8929 Other chronic pain: Secondary | ICD-10-CM | POA: Diagnosis not present

## 2020-12-03 DIAGNOSIS — G8929 Other chronic pain: Secondary | ICD-10-CM | POA: Diagnosis not present

## 2020-12-04 DIAGNOSIS — G8929 Other chronic pain: Secondary | ICD-10-CM | POA: Diagnosis not present

## 2020-12-05 DIAGNOSIS — G8929 Other chronic pain: Secondary | ICD-10-CM | POA: Diagnosis not present

## 2020-12-06 DIAGNOSIS — G8929 Other chronic pain: Secondary | ICD-10-CM | POA: Diagnosis not present

## 2020-12-09 DIAGNOSIS — G8929 Other chronic pain: Secondary | ICD-10-CM | POA: Diagnosis not present

## 2020-12-10 DIAGNOSIS — G8929 Other chronic pain: Secondary | ICD-10-CM | POA: Diagnosis not present

## 2020-12-11 ENCOUNTER — Other Ambulatory Visit: Payer: Self-pay | Admitting: Family Medicine

## 2020-12-11 DIAGNOSIS — G8929 Other chronic pain: Secondary | ICD-10-CM | POA: Diagnosis not present

## 2020-12-11 DIAGNOSIS — M545 Low back pain, unspecified: Secondary | ICD-10-CM

## 2020-12-11 DIAGNOSIS — M549 Dorsalgia, unspecified: Secondary | ICD-10-CM

## 2020-12-12 DIAGNOSIS — G8929 Other chronic pain: Secondary | ICD-10-CM | POA: Diagnosis not present

## 2020-12-13 DIAGNOSIS — G8929 Other chronic pain: Secondary | ICD-10-CM | POA: Diagnosis not present

## 2020-12-16 DIAGNOSIS — G8929 Other chronic pain: Secondary | ICD-10-CM | POA: Diagnosis not present

## 2020-12-17 DIAGNOSIS — G8929 Other chronic pain: Secondary | ICD-10-CM | POA: Diagnosis not present

## 2020-12-24 ENCOUNTER — Other Ambulatory Visit: Payer: Self-pay | Admitting: Family Medicine

## 2021-01-06 DIAGNOSIS — G8929 Other chronic pain: Secondary | ICD-10-CM | POA: Diagnosis not present

## 2021-01-07 DIAGNOSIS — G8929 Other chronic pain: Secondary | ICD-10-CM | POA: Diagnosis not present

## 2021-01-08 DIAGNOSIS — G8929 Other chronic pain: Secondary | ICD-10-CM | POA: Diagnosis not present

## 2021-01-13 DIAGNOSIS — G8929 Other chronic pain: Secondary | ICD-10-CM | POA: Diagnosis not present

## 2021-01-14 DIAGNOSIS — G8929 Other chronic pain: Secondary | ICD-10-CM | POA: Diagnosis not present

## 2021-01-20 DIAGNOSIS — G8929 Other chronic pain: Secondary | ICD-10-CM | POA: Diagnosis not present

## 2021-01-21 DIAGNOSIS — G8929 Other chronic pain: Secondary | ICD-10-CM | POA: Diagnosis not present

## 2021-01-22 DIAGNOSIS — G8929 Other chronic pain: Secondary | ICD-10-CM | POA: Diagnosis not present

## 2021-01-23 DIAGNOSIS — G8929 Other chronic pain: Secondary | ICD-10-CM | POA: Diagnosis not present

## 2021-01-24 DIAGNOSIS — G8929 Other chronic pain: Secondary | ICD-10-CM | POA: Diagnosis not present

## 2021-01-27 DIAGNOSIS — G8929 Other chronic pain: Secondary | ICD-10-CM | POA: Diagnosis not present

## 2021-01-28 DIAGNOSIS — G8929 Other chronic pain: Secondary | ICD-10-CM | POA: Diagnosis not present

## 2021-01-29 ENCOUNTER — Telehealth: Payer: Self-pay | Admitting: Family Medicine

## 2021-01-30 NOTE — Telephone Encounter (Signed)
Done and faxed

## 2021-02-11 ENCOUNTER — Other Ambulatory Visit: Payer: Self-pay | Admitting: Family Medicine

## 2021-02-11 DIAGNOSIS — G4700 Insomnia, unspecified: Secondary | ICD-10-CM

## 2021-02-12 DIAGNOSIS — G8929 Other chronic pain: Secondary | ICD-10-CM | POA: Diagnosis not present

## 2021-02-13 DIAGNOSIS — G8929 Other chronic pain: Secondary | ICD-10-CM | POA: Diagnosis not present

## 2021-02-14 DIAGNOSIS — G8929 Other chronic pain: Secondary | ICD-10-CM | POA: Diagnosis not present

## 2021-02-21 ENCOUNTER — Other Ambulatory Visit: Payer: Self-pay | Admitting: Family Medicine

## 2021-02-21 DIAGNOSIS — G8929 Other chronic pain: Secondary | ICD-10-CM

## 2021-02-21 DIAGNOSIS — M545 Low back pain, unspecified: Secondary | ICD-10-CM

## 2021-02-21 NOTE — Telephone Encounter (Signed)
Stacks NTBS 30 days given in May

## 2021-02-28 ENCOUNTER — Ambulatory Visit: Payer: Medicaid Other | Admitting: Family Medicine

## 2021-02-28 ENCOUNTER — Other Ambulatory Visit: Payer: Self-pay | Admitting: Family Medicine

## 2021-02-28 DIAGNOSIS — I6522 Occlusion and stenosis of left carotid artery: Secondary | ICD-10-CM

## 2021-03-03 ENCOUNTER — Ambulatory Visit: Payer: Medicaid Other | Admitting: Family Medicine

## 2021-03-13 ENCOUNTER — Other Ambulatory Visit: Payer: Self-pay | Admitting: Family Medicine

## 2021-03-13 DIAGNOSIS — I10 Essential (primary) hypertension: Secondary | ICD-10-CM

## 2021-03-14 ENCOUNTER — Encounter: Payer: Self-pay | Admitting: Family Medicine

## 2021-03-14 ENCOUNTER — Ambulatory Visit: Payer: Medicaid Other | Admitting: Family Medicine

## 2021-03-14 ENCOUNTER — Other Ambulatory Visit: Payer: Self-pay

## 2021-03-14 ENCOUNTER — Other Ambulatory Visit: Payer: Self-pay | Admitting: Family Medicine

## 2021-03-14 VITALS — BP 114/61 | HR 68 | Temp 98.0°F | Ht 75.0 in | Wt 266.2 lb

## 2021-03-14 DIAGNOSIS — G8929 Other chronic pain: Secondary | ICD-10-CM | POA: Diagnosis not present

## 2021-03-14 DIAGNOSIS — I480 Paroxysmal atrial fibrillation: Secondary | ICD-10-CM

## 2021-03-14 DIAGNOSIS — F411 Generalized anxiety disorder: Secondary | ICD-10-CM

## 2021-03-14 DIAGNOSIS — G4700 Insomnia, unspecified: Secondary | ICD-10-CM

## 2021-03-14 DIAGNOSIS — M549 Dorsalgia, unspecified: Secondary | ICD-10-CM | POA: Diagnosis not present

## 2021-03-14 DIAGNOSIS — M545 Low back pain, unspecified: Secondary | ICD-10-CM | POA: Diagnosis not present

## 2021-03-14 DIAGNOSIS — E119 Type 2 diabetes mellitus without complications: Secondary | ICD-10-CM

## 2021-03-14 DIAGNOSIS — I10 Essential (primary) hypertension: Secondary | ICD-10-CM

## 2021-03-14 DIAGNOSIS — E785 Hyperlipidemia, unspecified: Secondary | ICD-10-CM | POA: Diagnosis not present

## 2021-03-14 LAB — BAYER DCA HB A1C WAIVED: HB A1C (BAYER DCA - WAIVED): 6.1 % (ref <7.0)

## 2021-03-14 MED ORDER — METOPROLOL SUCCINATE ER 50 MG PO TB24: ORAL_TABLET | ORAL | 2 refills | Status: DC

## 2021-03-14 MED ORDER — APIXABAN 5 MG PO TABS: 5.0000 mg | ORAL_TABLET | Freq: Two times a day (BID) | ORAL | 3 refills | Status: DC

## 2021-03-14 MED ORDER — METFORMIN HCL ER 750 MG PO TB24: 750.0000 mg | ORAL_TABLET | Freq: Every day | ORAL | 3 refills | Status: DC

## 2021-03-14 MED ORDER — TRAZODONE HCL 150 MG PO TABS: 300.0000 mg | ORAL_TABLET | Freq: Every day | ORAL | 3 refills | Status: DC

## 2021-03-14 MED ORDER — GABAPENTIN 300 MG PO CAPS: 300.0000 mg | ORAL_CAPSULE | Freq: Three times a day (TID) | ORAL | 3 refills | Status: DC

## 2021-03-14 MED ORDER — ZOLPIDEM TARTRATE 10 MG PO TABS: 10.0000 mg | ORAL_TABLET | Freq: Every day | ORAL | 1 refills | Status: DC

## 2021-03-14 MED ORDER — DULOXETINE HCL 60 MG PO CPEP: 60.0000 mg | ORAL_CAPSULE | Freq: Every day | ORAL | 2 refills | Status: DC

## 2021-03-14 MED ORDER — OMEPRAZOLE 20 MG PO CPDR: DELAYED_RELEASE_CAPSULE | ORAL | 2 refills | Status: DC

## 2021-03-15 LAB — CBC WITH DIFFERENTIAL/PLATELET
Basophils Absolute: 0.1 10*3/uL (ref 0.0–0.2)
Basos: 1 %
EOS (ABSOLUTE): 0.3 10*3/uL (ref 0.0–0.4)
Eos: 4 %
Hematocrit: 39.9 % (ref 37.5–51.0)
Hemoglobin: 13.8 g/dL (ref 13.0–17.7)
Immature Grans (Abs): 0 10*3/uL (ref 0.0–0.1)
Immature Granulocytes: 0 %
Lymphocytes Absolute: 1.6 10*3/uL (ref 0.7–3.1)
Lymphs: 23 %
MCH: 31.4 pg (ref 26.6–33.0)
MCHC: 34.6 g/dL (ref 31.5–35.7)
MCV: 91 fL (ref 79–97)
Monocytes Absolute: 0.7 10*3/uL (ref 0.1–0.9)
Monocytes: 10 %
Neutrophils Absolute: 4.5 10*3/uL (ref 1.4–7.0)
Neutrophils: 62 %
Platelets: 188 10*3/uL (ref 150–450)
RBC: 4.39 x10E6/uL (ref 4.14–5.80)
RDW: 14.2 % (ref 11.6–15.4)
WBC: 7.2 10*3/uL (ref 3.4–10.8)

## 2021-03-15 LAB — LIPID PANEL
Chol/HDL Ratio: 4.1 ratio (ref 0.0–5.0)
Cholesterol, Total: 119 mg/dL (ref 100–199)
HDL: 29 mg/dL — ABNORMAL LOW (ref >39)
LDL Chol Calc (NIH): 46 mg/dL (ref 0–99)
Triglycerides: 288 mg/dL — ABNORMAL HIGH (ref 0–149)
VLDL Cholesterol Cal: 44 mg/dL — ABNORMAL HIGH (ref 5–40)

## 2021-03-15 LAB — CMP14+EGFR
ALT: 13 IU/L (ref 0–44)
AST: 14 IU/L (ref 0–40)
Albumin/Globulin Ratio: 1.5 (ref 1.2–2.2)
Albumin: 3.8 g/dL (ref 3.8–4.8)
Alkaline Phosphatase: 65 IU/L (ref 44–121)
BUN/Creatinine Ratio: 4 — ABNORMAL LOW (ref 10–24)
BUN: 5 mg/dL — ABNORMAL LOW (ref 8–27)
Bilirubin Total: 0.4 mg/dL (ref 0.0–1.2)
CO2: 27 mmol/L (ref 20–29)
Calcium: 8.7 mg/dL (ref 8.6–10.2)
Chloride: 100 mmol/L (ref 96–106)
Creatinine, Ser: 1.13 mg/dL (ref 0.76–1.27)
Globulin, Total: 2.5 g/dL (ref 1.5–4.5)
Glucose: 117 mg/dL — ABNORMAL HIGH (ref 65–99)
Potassium: 3.6 mmol/L (ref 3.5–5.2)
Sodium: 139 mmol/L (ref 134–144)
Total Protein: 6.3 g/dL (ref 6.0–8.5)
eGFR: 74 mL/min/{1.73_m2} (ref >59)

## 2021-03-17 ENCOUNTER — Encounter: Payer: Self-pay | Admitting: Family Medicine

## 2021-03-17 ENCOUNTER — Telehealth: Payer: Self-pay | Admitting: *Deleted

## 2021-03-17 DIAGNOSIS — G4700 Insomnia, unspecified: Secondary | ICD-10-CM

## 2021-03-17 NOTE — Telephone Encounter (Signed)
Zolpidem Tartrate '10MG'$  tablets PA started  Key: [id number]  Sent to plan

## 2021-03-17 NOTE — Telephone Encounter (Signed)
Approved today PA Case: [id number], Status: Approved, Coverage Starts on: 03/17/2021 12:00:00 AM, Coverage Ends on: 09/13/2021 12:00:00 AM [facility] drug aware
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
## 2021-03-17 NOTE — Progress Notes (Signed)
Hello Waldron,  Your lab result is normal and/or stable.Some minor variations that are not significant are commonly marked abnormal, but do not represent any medical problem for you.  Best regards, Claretta Fraise, M.D.

## 2021-05-23 DIAGNOSIS — M5400 Panniculitis affecting regions of neck and back, site unspecified: Secondary | ICD-10-CM | POA: Diagnosis not present

## 2021-05-26 DIAGNOSIS — M5400 Panniculitis affecting regions of neck and back, site unspecified: Secondary | ICD-10-CM | POA: Diagnosis not present

## 2021-05-27 DIAGNOSIS — M5400 Panniculitis affecting regions of neck and back, site unspecified: Secondary | ICD-10-CM | POA: Diagnosis not present

## 2021-05-28 DIAGNOSIS — M5400 Panniculitis affecting regions of neck and back, site unspecified: Secondary | ICD-10-CM | POA: Diagnosis not present

## 2021-05-30 DIAGNOSIS — M5400 Panniculitis affecting regions of neck and back, site unspecified: Secondary | ICD-10-CM | POA: Diagnosis not present

## 2021-06-02 DIAGNOSIS — M5400 Panniculitis affecting regions of neck and back, site unspecified: Secondary | ICD-10-CM | POA: Diagnosis not present

## 2021-06-03 DIAGNOSIS — M5400 Panniculitis affecting regions of neck and back, site unspecified: Secondary | ICD-10-CM | POA: Diagnosis not present

## 2021-06-04 ENCOUNTER — Ambulatory Visit: Payer: Medicaid Other | Admitting: Family Medicine

## 2021-06-04 DIAGNOSIS — M5400 Panniculitis affecting regions of neck and back, site unspecified: Secondary | ICD-10-CM | POA: Diagnosis not present

## 2021-06-05 ENCOUNTER — Other Ambulatory Visit: Payer: Self-pay

## 2021-06-05 DIAGNOSIS — M5400 Panniculitis affecting regions of neck and back, site unspecified: Secondary | ICD-10-CM | POA: Diagnosis not present

## 2021-06-05 DIAGNOSIS — I6521 Occlusion and stenosis of right carotid artery: Secondary | ICD-10-CM

## 2021-06-05 DIAGNOSIS — I6522 Occlusion and stenosis of left carotid artery: Secondary | ICD-10-CM

## 2021-06-06 ENCOUNTER — Encounter: Payer: Self-pay | Admitting: Family Medicine

## 2021-06-09 DIAGNOSIS — M5400 Panniculitis affecting regions of neck and back, site unspecified: Secondary | ICD-10-CM | POA: Diagnosis not present

## 2021-06-10 DIAGNOSIS — M5400 Panniculitis affecting regions of neck and back, site unspecified: Secondary | ICD-10-CM | POA: Diagnosis not present

## 2021-06-11 DIAGNOSIS — M5400 Panniculitis affecting regions of neck and back, site unspecified: Secondary | ICD-10-CM | POA: Diagnosis not present

## 2021-06-12 DIAGNOSIS — M5400 Panniculitis affecting regions of neck and back, site unspecified: Secondary | ICD-10-CM | POA: Diagnosis not present

## 2021-06-13 DIAGNOSIS — M5400 Panniculitis affecting regions of neck and back, site unspecified: Secondary | ICD-10-CM | POA: Diagnosis not present

## 2021-06-16 DIAGNOSIS — M5400 Panniculitis affecting regions of neck and back, site unspecified: Secondary | ICD-10-CM | POA: Diagnosis not present

## 2021-06-17 DIAGNOSIS — M5400 Panniculitis affecting regions of neck and back, site unspecified: Secondary | ICD-10-CM | POA: Diagnosis not present

## 2021-06-18 DIAGNOSIS — M5400 Panniculitis affecting regions of neck and back, site unspecified: Secondary | ICD-10-CM | POA: Diagnosis not present

## 2021-06-19 DIAGNOSIS — M5400 Panniculitis affecting regions of neck and back, site unspecified: Secondary | ICD-10-CM | POA: Diagnosis not present

## 2021-06-20 DIAGNOSIS — M5400 Panniculitis affecting regions of neck and back, site unspecified: Secondary | ICD-10-CM | POA: Diagnosis not present

## 2021-06-23 DIAGNOSIS — M5400 Panniculitis affecting regions of neck and back, site unspecified: Secondary | ICD-10-CM | POA: Diagnosis not present

## 2021-06-24 DIAGNOSIS — M5400 Panniculitis affecting regions of neck and back, site unspecified: Secondary | ICD-10-CM | POA: Diagnosis not present

## 2021-06-25 DIAGNOSIS — M5400 Panniculitis affecting regions of neck and back, site unspecified: Secondary | ICD-10-CM | POA: Diagnosis not present

## 2021-06-26 DIAGNOSIS — M5400 Panniculitis affecting regions of neck and back, site unspecified: Secondary | ICD-10-CM | POA: Diagnosis not present

## 2021-06-27 DIAGNOSIS — M5400 Panniculitis affecting regions of neck and back, site unspecified: Secondary | ICD-10-CM | POA: Diagnosis not present

## 2021-06-30 DIAGNOSIS — M5400 Panniculitis affecting regions of neck and back, site unspecified: Secondary | ICD-10-CM | POA: Diagnosis not present
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
## 2021-07-01 ENCOUNTER — Encounter (HOSPITAL_COMMUNITY): Payer: Medicaid Other

## 2021-07-01 ENCOUNTER — Ambulatory Visit: Payer: Medicaid Other | Admitting: Vascular Surgery

## 2021-07-01 DIAGNOSIS — M5400 Panniculitis affecting regions of neck and back, site unspecified: Secondary | ICD-10-CM | POA: Diagnosis not present

## 2021-07-02 DIAGNOSIS — M5400 Panniculitis affecting regions of neck and back, site unspecified: Secondary | ICD-10-CM | POA: Diagnosis not present

## 2021-07-03 DIAGNOSIS — M5400 Panniculitis affecting regions of neck and back, site unspecified: Secondary | ICD-10-CM | POA: Diagnosis not present

## 2021-07-07 DIAGNOSIS — M5400 Panniculitis affecting regions of neck and back, site unspecified: Secondary | ICD-10-CM | POA: Diagnosis not present

## 2021-07-08 DIAGNOSIS — M5400 Panniculitis affecting regions of neck and back, site unspecified: Secondary | ICD-10-CM | POA: Diagnosis not present

## 2021-07-09 DIAGNOSIS — M5400 Panniculitis affecting regions of neck and back, site unspecified: Secondary | ICD-10-CM | POA: Diagnosis not present

## 2021-07-10 DIAGNOSIS — M5400 Panniculitis affecting regions of neck and back, site unspecified: Secondary | ICD-10-CM | POA: Diagnosis not present

## 2021-07-11 DIAGNOSIS — M5400 Panniculitis affecting regions of neck and back, site unspecified: Secondary | ICD-10-CM | POA: Diagnosis not present

## 2021-07-14 DIAGNOSIS — M5400 Panniculitis affecting regions of neck and back, site unspecified: Secondary | ICD-10-CM | POA: Diagnosis not present

## 2021-07-15 ENCOUNTER — Other Ambulatory Visit: Payer: Self-pay | Admitting: Family Medicine

## 2021-07-15 DIAGNOSIS — M545 Low back pain, unspecified: Secondary | ICD-10-CM

## 2021-07-15 DIAGNOSIS — M549 Dorsalgia, unspecified: Secondary | ICD-10-CM

## 2021-07-15 DIAGNOSIS — M5400 Panniculitis affecting regions of neck and back, site unspecified: Secondary | ICD-10-CM | POA: Diagnosis not present

## 2021-07-16 DIAGNOSIS — M5400 Panniculitis affecting regions of neck and back, site unspecified: Secondary | ICD-10-CM | POA: Diagnosis not present

## 2021-07-17 DIAGNOSIS — M5400 Panniculitis affecting regions of neck and back, site unspecified: Secondary | ICD-10-CM | POA: Diagnosis not present

## 2021-07-18 DIAGNOSIS — M5400 Panniculitis affecting regions of neck and back, site unspecified: Secondary | ICD-10-CM | POA: Diagnosis not present

## 2021-07-21 DIAGNOSIS — M5400 Panniculitis affecting regions of neck and back, site unspecified: Secondary | ICD-10-CM | POA: Diagnosis not present

## 2021-07-22 DIAGNOSIS — M5400 Panniculitis affecting regions of neck and back, site unspecified: Secondary | ICD-10-CM | POA: Diagnosis not present

## 2021-07-23 DIAGNOSIS — M5400 Panniculitis affecting regions of neck and back, site unspecified: Secondary | ICD-10-CM | POA: Diagnosis not present

## 2021-07-24 DIAGNOSIS — M5400 Panniculitis affecting regions of neck and back, site unspecified: Secondary | ICD-10-CM | POA: Diagnosis not present

## 2021-07-28 DIAGNOSIS — M5400 Panniculitis affecting regions of neck and back, site unspecified: Secondary | ICD-10-CM | POA: Diagnosis not present

## 2021-07-30 DIAGNOSIS — M5400 Panniculitis affecting regions of neck and back, site unspecified: Secondary | ICD-10-CM | POA: Diagnosis not present

## 2021-07-31 DIAGNOSIS — M5400 Panniculitis affecting regions of neck and back, site unspecified: Secondary | ICD-10-CM | POA: Diagnosis not present

## 2021-08-04 DIAGNOSIS — M5400 Panniculitis affecting regions of neck and back, site unspecified: Secondary | ICD-10-CM | POA: Diagnosis not present

## 2021-08-05 DIAGNOSIS — M5400 Panniculitis affecting regions of neck and back, site unspecified: Secondary | ICD-10-CM | POA: Diagnosis not present

## 2021-08-06 DIAGNOSIS — M5400 Panniculitis affecting regions of neck and back, site unspecified: Secondary | ICD-10-CM | POA: Diagnosis not present

## 2021-08-07 DIAGNOSIS — M5400 Panniculitis affecting regions of neck and back, site unspecified: Secondary | ICD-10-CM | POA: Diagnosis not present

## 2021-08-08 DIAGNOSIS — M5400 Panniculitis affecting regions of neck and back, site unspecified: Secondary | ICD-10-CM | POA: Diagnosis not present

## 2021-08-11 DIAGNOSIS — M5400 Panniculitis affecting regions of neck and back, site unspecified: Secondary | ICD-10-CM | POA: Diagnosis not present

## 2021-08-12 DIAGNOSIS — M5400 Panniculitis affecting regions of neck and back, site unspecified: Secondary | ICD-10-CM | POA: Diagnosis not present

## 2021-08-13 DIAGNOSIS — M5400 Panniculitis affecting regions of neck and back, site unspecified: Secondary | ICD-10-CM | POA: Diagnosis not present

## 2021-08-14 DIAGNOSIS — M5400 Panniculitis affecting regions of neck and back, site unspecified: Secondary | ICD-10-CM | POA: Diagnosis not present

## 2021-08-15 DIAGNOSIS — M5400 Panniculitis affecting regions of neck and back, site unspecified: Secondary | ICD-10-CM | POA: Diagnosis not present

## 2021-08-18 DIAGNOSIS — M25511 Pain in right shoulder: Secondary | ICD-10-CM | POA: Diagnosis not present

## 2021-08-18 DIAGNOSIS — M5416 Radiculopathy, lumbar region: Secondary | ICD-10-CM | POA: Diagnosis not present

## 2021-08-18 DIAGNOSIS — I6522 Occlusion and stenosis of left carotid artery: Secondary | ICD-10-CM | POA: Diagnosis not present

## 2021-08-18 DIAGNOSIS — M25512 Pain in left shoulder: Secondary | ICD-10-CM | POA: Diagnosis not present

## 2021-08-18 DIAGNOSIS — I4891 Unspecified atrial fibrillation: Secondary | ICD-10-CM | POA: Diagnosis not present

## 2021-08-19 DIAGNOSIS — M5400 Panniculitis affecting regions of neck and back, site unspecified: Secondary | ICD-10-CM | POA: Diagnosis not present

## 2021-08-20 DIAGNOSIS — M5400 Panniculitis affecting regions of neck and back, site unspecified: Secondary | ICD-10-CM | POA: Diagnosis not present

## 2021-08-21 DIAGNOSIS — M5400 Panniculitis affecting regions of neck and back, site unspecified: Secondary | ICD-10-CM | POA: Diagnosis not present

## 2021-08-22 DIAGNOSIS — M5400 Panniculitis affecting regions of neck and back, site unspecified: Secondary | ICD-10-CM | POA: Diagnosis not present

## 2021-08-24 ENCOUNTER — Other Ambulatory Visit: Payer: Self-pay | Admitting: Family Medicine

## 2021-08-24 DIAGNOSIS — G4700 Insomnia, unspecified: Secondary | ICD-10-CM

## 2021-08-25 DIAGNOSIS — M5400 Panniculitis affecting regions of neck and back, site unspecified: Secondary | ICD-10-CM | POA: Diagnosis not present

## 2021-08-26 DIAGNOSIS — M5400 Panniculitis affecting regions of neck and back, site unspecified: Secondary | ICD-10-CM | POA: Diagnosis not present

## 2021-08-27 DIAGNOSIS — M5400 Panniculitis affecting regions of neck and back, site unspecified: Secondary | ICD-10-CM | POA: Diagnosis not present

## 2021-08-28 DIAGNOSIS — M5400 Panniculitis affecting regions of neck and back, site unspecified: Secondary | ICD-10-CM | POA: Diagnosis not present

## 2021-08-29 DIAGNOSIS — M5400 Panniculitis affecting regions of neck and back, site unspecified: Secondary | ICD-10-CM | POA: Diagnosis not present

## 2021-09-01 DIAGNOSIS — M5400 Panniculitis affecting regions of neck and back, site unspecified: Secondary | ICD-10-CM | POA: Diagnosis not present

## 2021-09-02 DIAGNOSIS — M5400 Panniculitis affecting regions of neck and back, site unspecified: Secondary | ICD-10-CM | POA: Diagnosis not present

## 2021-09-03 DIAGNOSIS — M5400 Panniculitis affecting regions of neck and back, site unspecified: Secondary | ICD-10-CM | POA: Diagnosis not present

## 2021-09-04 DIAGNOSIS — M5400 Panniculitis affecting regions of neck and back, site unspecified: Secondary | ICD-10-CM | POA: Diagnosis not present

## 2021-09-05 DIAGNOSIS — M5400 Panniculitis affecting regions of neck and back, site unspecified: Secondary | ICD-10-CM | POA: Diagnosis not present

## 2021-09-06 ENCOUNTER — Other Ambulatory Visit: Payer: Self-pay | Admitting: Family Medicine

## 2021-09-06 DIAGNOSIS — G4700 Insomnia, unspecified: Secondary | ICD-10-CM

## 2021-09-08 DIAGNOSIS — M5400 Panniculitis affecting regions of neck and back, site unspecified: Secondary | ICD-10-CM | POA: Diagnosis not present

## 2021-09-09 ENCOUNTER — Other Ambulatory Visit: Payer: Self-pay | Admitting: Family Medicine

## 2021-09-09 DIAGNOSIS — M5400 Panniculitis affecting regions of neck and back, site unspecified: Secondary | ICD-10-CM | POA: Diagnosis not present

## 2021-09-09 DIAGNOSIS — I10 Essential (primary) hypertension: Secondary | ICD-10-CM

## 2021-09-09 DIAGNOSIS — G4700 Insomnia, unspecified: Secondary | ICD-10-CM

## 2021-09-09 LAB — GLUCOSE, POCT (MANUAL RESULT ENTRY): POC Glucose: 134 mg/dl — AB (ref 70–99)

## 2021-09-09 NOTE — Congregational Nurse Program (Signed)
No complaints or concerns. Has history of hypertension.  Importance of taking prescribed medication reviewed as well as diet and exercise.BP165/83; P 79; Random Blood Glucose 134 [place]

## 2021-09-10 ENCOUNTER — Other Ambulatory Visit: Payer: Self-pay

## 2021-09-10 ENCOUNTER — Encounter: Payer: Self-pay | Admitting: Vascular Surgery

## 2021-09-10 ENCOUNTER — Ambulatory Visit (INDEPENDENT_AMBULATORY_CARE_PROVIDER_SITE_OTHER): Payer: Medicaid Other

## 2021-09-10 ENCOUNTER — Ambulatory Visit (INDEPENDENT_AMBULATORY_CARE_PROVIDER_SITE_OTHER): Payer: Medicaid Other | Admitting: Vascular Surgery

## 2021-09-10 VITALS — BP 124/68 | HR 80 | Temp 99.4°F | Resp 22 | Ht 75.0 in | Wt 269.0 lb

## 2021-09-10 DIAGNOSIS — I6521 Occlusion and stenosis of right carotid artery: Secondary | ICD-10-CM

## 2021-09-10 DIAGNOSIS — I6522 Occlusion and stenosis of left carotid artery: Secondary | ICD-10-CM | POA: Diagnosis not present

## 2021-09-10 DIAGNOSIS — M5400 Panniculitis affecting regions of neck and back, site unspecified: Secondary | ICD-10-CM | POA: Diagnosis not present

## 2021-09-10 DIAGNOSIS — I6523 Occlusion and stenosis of bilateral carotid arteries: Secondary | ICD-10-CM | POA: Diagnosis not present
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
## 2021-09-11 DIAGNOSIS — M5400 Panniculitis affecting regions of neck and back, site unspecified: Secondary | ICD-10-CM | POA: Diagnosis not present

## 2021-09-12 DIAGNOSIS — M5400 Panniculitis affecting regions of neck and back, site unspecified: Secondary | ICD-10-CM | POA: Diagnosis not present

## 2021-09-13 ENCOUNTER — Other Ambulatory Visit: Payer: Self-pay | Admitting: Family Medicine

## 2021-09-13 DIAGNOSIS — I6522 Occlusion and stenosis of left carotid artery: Secondary | ICD-10-CM

## 2021-09-15 DIAGNOSIS — M5400 Panniculitis affecting regions of neck and back, site unspecified: Secondary | ICD-10-CM | POA: Diagnosis not present

## 2021-09-16 DIAGNOSIS — M5400 Panniculitis affecting regions of neck and back, site unspecified: Secondary | ICD-10-CM | POA: Diagnosis not present

## 2021-09-17 DIAGNOSIS — M5400 Panniculitis affecting regions of neck and back, site unspecified: Secondary | ICD-10-CM | POA: Diagnosis not present

## 2021-09-18 DIAGNOSIS — M5400 Panniculitis affecting regions of neck and back, site unspecified: Secondary | ICD-10-CM | POA: Diagnosis not present

## 2021-09-19 DIAGNOSIS — M5400 Panniculitis affecting regions of neck and back, site unspecified: Secondary | ICD-10-CM | POA: Diagnosis not present

## 2021-09-22 DIAGNOSIS — M5400 Panniculitis affecting regions of neck and back, site unspecified: Secondary | ICD-10-CM | POA: Diagnosis not present

## 2021-09-23 DIAGNOSIS — M5400 Panniculitis affecting regions of neck and back, site unspecified: Secondary | ICD-10-CM | POA: Diagnosis not present

## 2021-09-23 IMAGING — DX DG ANKLE COMPLETE 3+V*R*
3 series · 3 of 3 positions shown · non-contrast
Comparison: None.

CLINICAL DATA: Pain along the lateral malleolus and fourth and
fifth metacarpals distally. Syncopal episode with fall

EXAM:
RIGHT ANKLE - COMPLETE 3+ VIEW; RIGHT FOOT COMPLETE - 3+ VIEW

[ankle ap]
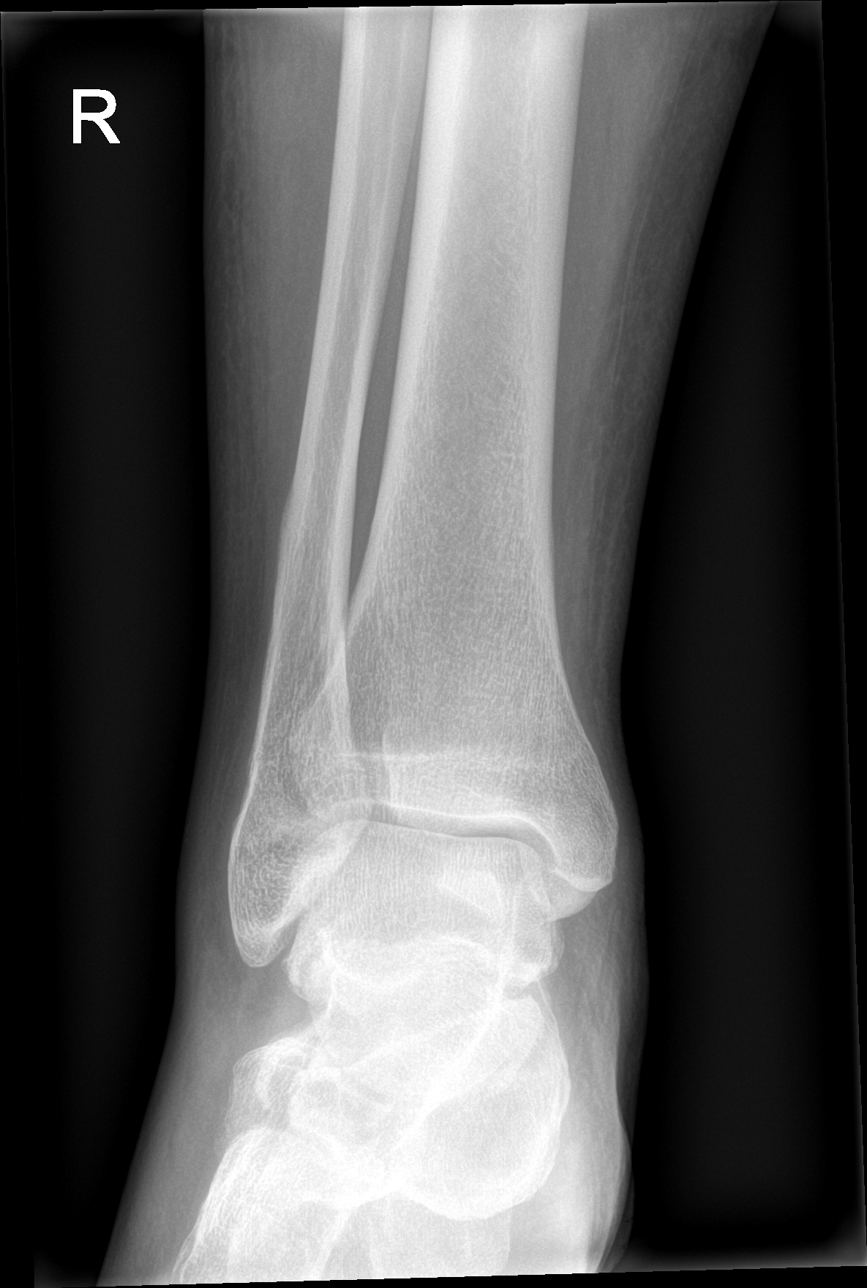

[ankle obl]
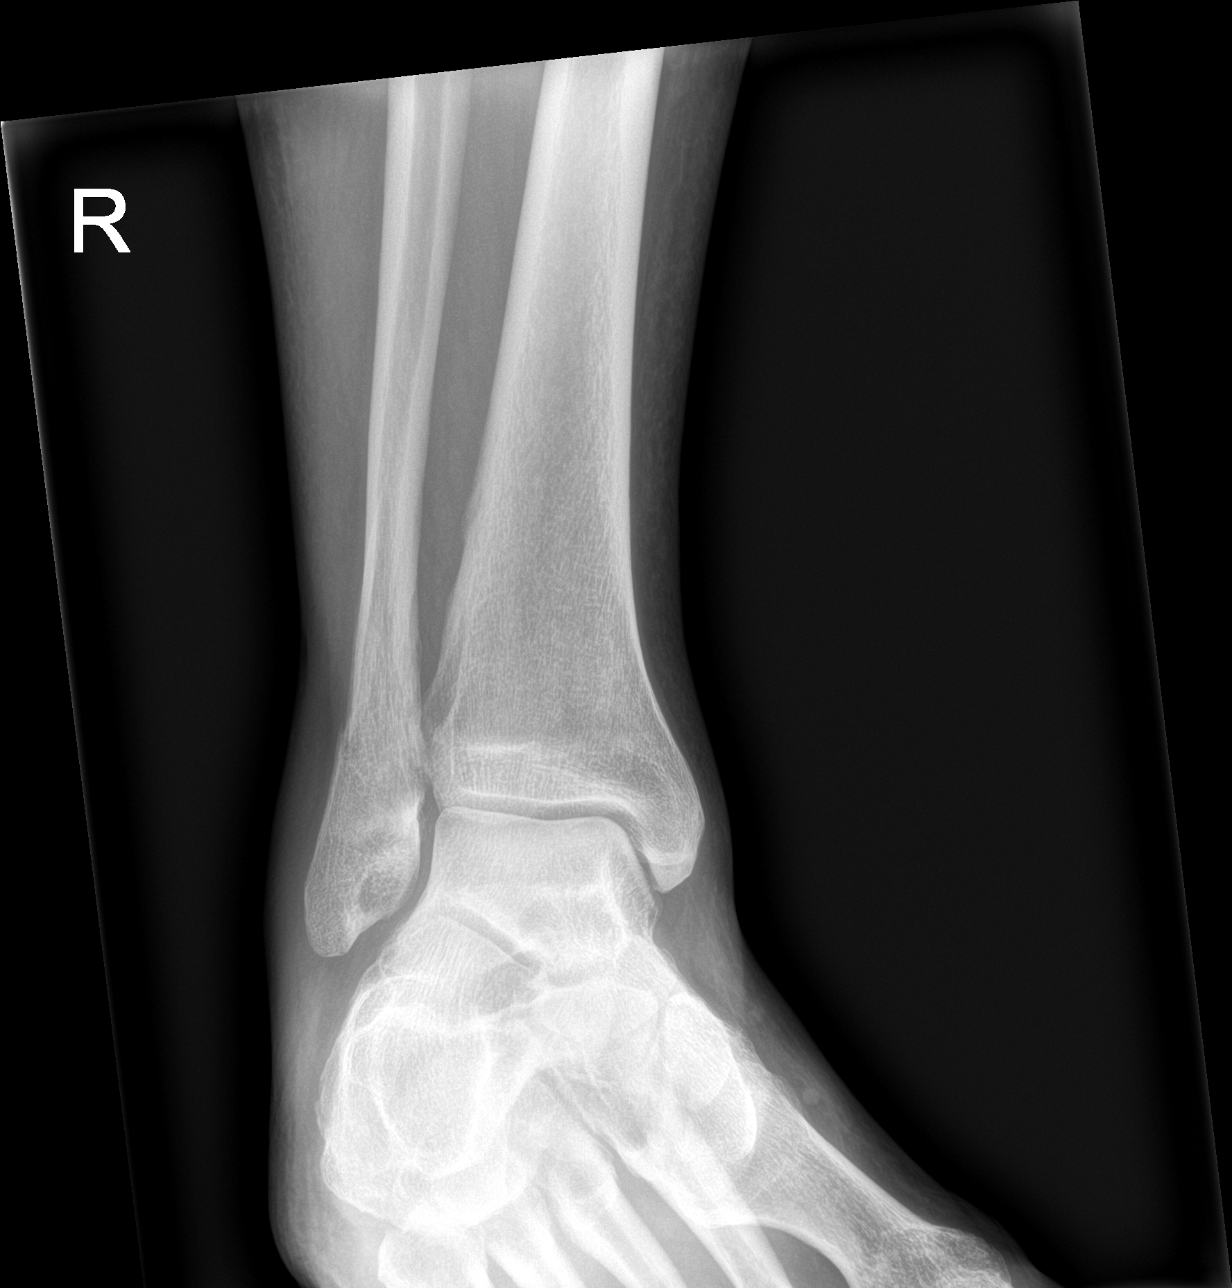

[ankle lat]
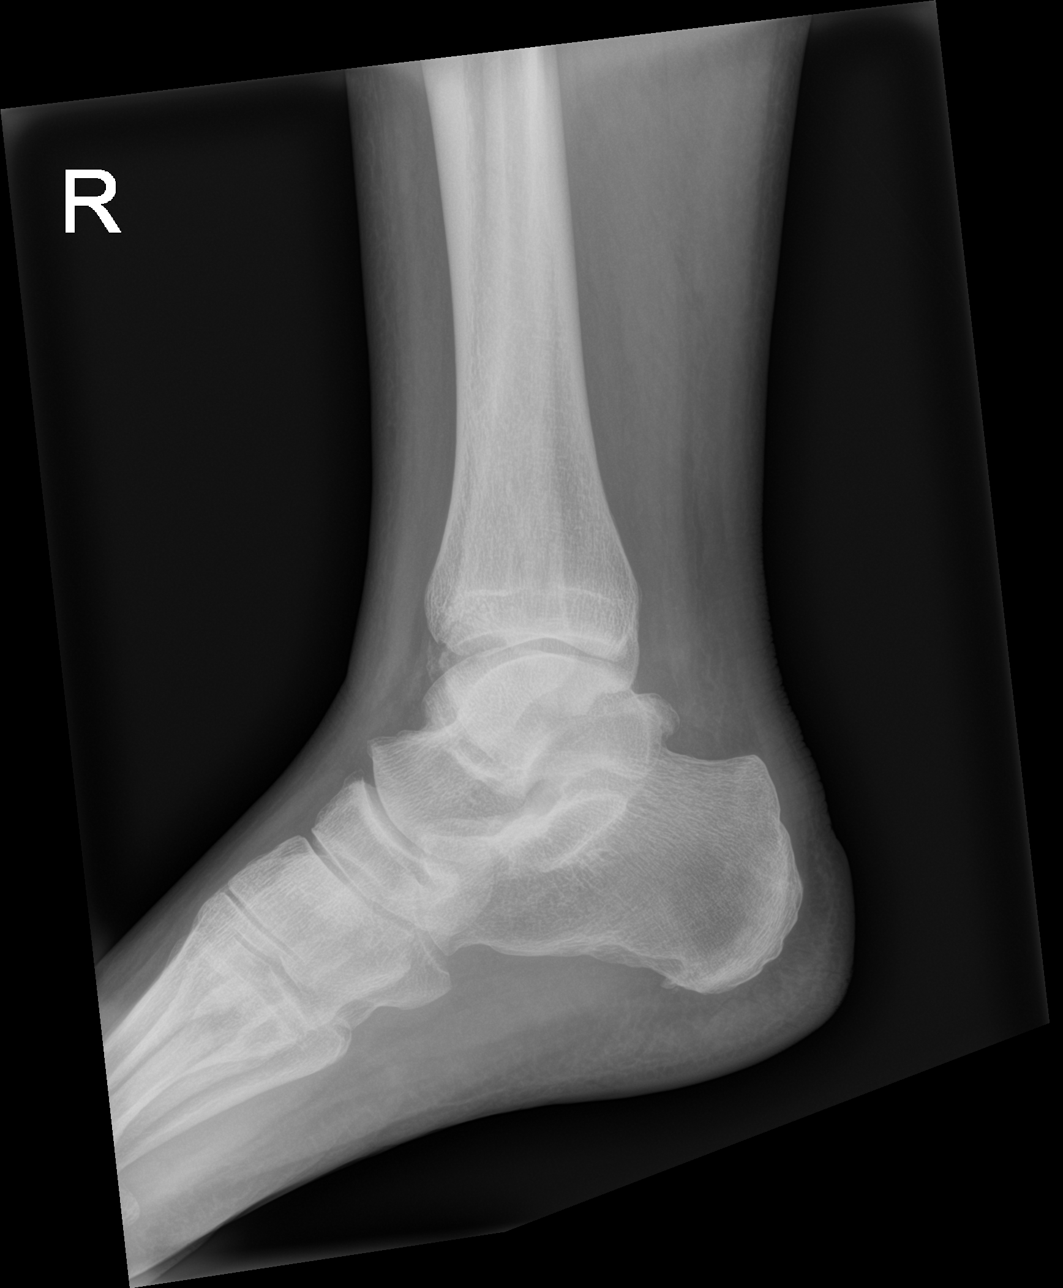

[3 of 3 positions shown; findings below may reference images not displayed]

FINDINGS: Obliquely oriented transsyndesmotic fracture of the distal fibula.
Additional avulsed fracture fragment noted along the anterior ankle
joint line arising from the anterior tibial plafond. No other
visible fracture or traumatic malalignment at the ankle. Findings
compatible with a Weber B stage II/II injury. Circumferential
swelling of the ankle. Small ankle joint effusion.

Bones of the foot are intact. No gross traumatic malalignment of the
foot within the limitations of a nonweightbearing exam. Mild
degenerative changes throughout the foot with some more focal
arthrosis of the first metatarsophalangeal joint. Plantar calcaneal
spur.
IMPRESSION: Obliquely oriented transsyndesmotic fracture of the distal fibula
with an avulsed fracture fragment along the anterior ankle joint
line arising from the anterior tibial plafond. Findings compatible
with a Weber B stage II/III injury.

## 2021-09-23 IMAGING — CT CT CERVICAL SPINE W/O CM
3 of 4 series · 11 of 33 positions shown, 13 images · non-contrast
Comparison: None.

CLINICAL DATA: Head trauma

EXAM:
CT HEAD WITHOUT CONTRAST
CT CERVICAL SPINE WITHOUT CONTRAST
TECHNIQUE: Multidetector CT imaging of the head and cervical spine was
performed following the standard protocol without intravenous
contrast. Multiplanar CT image reconstructions of the cervical spine
were also generated.

[Series 5: sagittal bone · sagittal · 0.26mm/px · 5 of 61 slices shown, 6 images]
[im 21/61  bone]
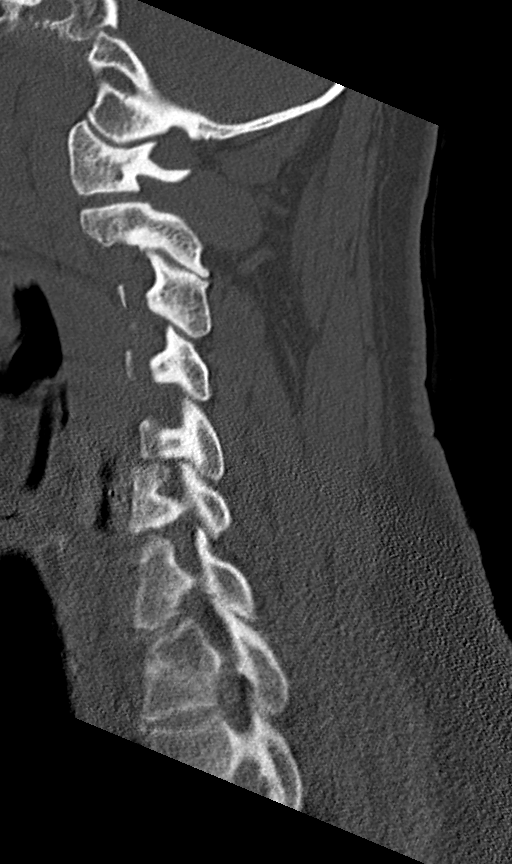
[im 26/61  bone]
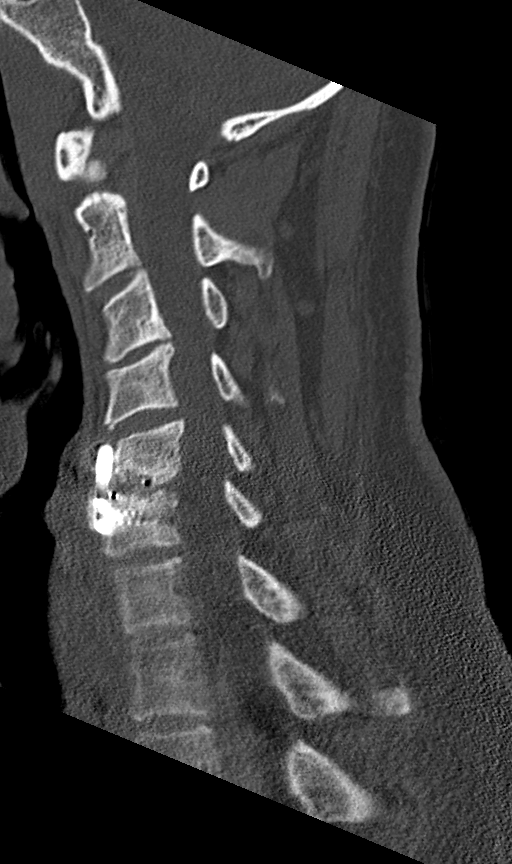
[im 31/61  soft-tissue]
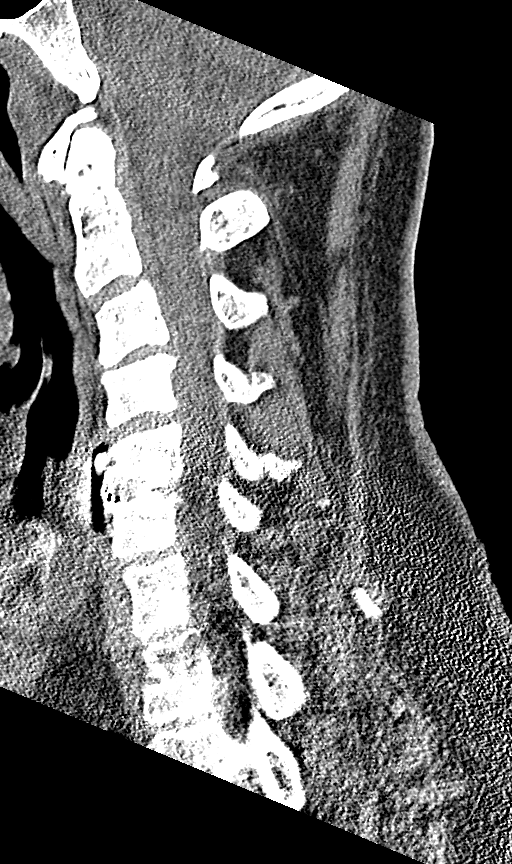
[im 31/61  bone]
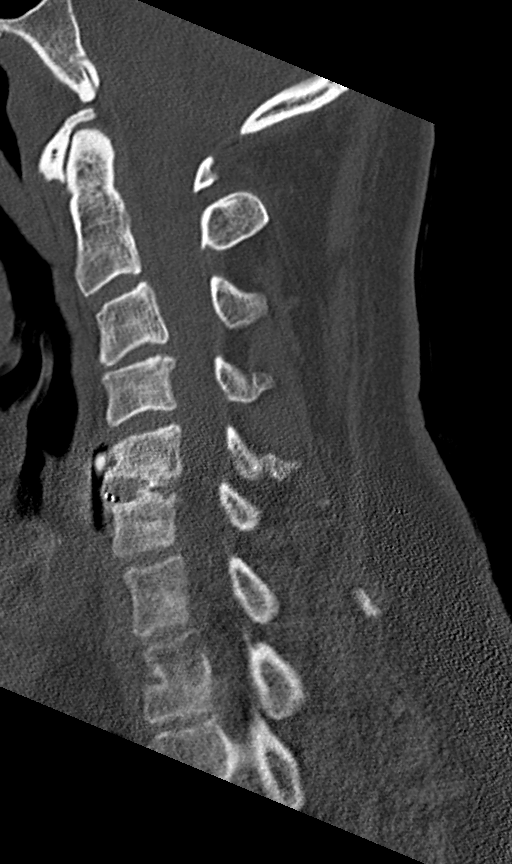
[im 36/61  bone]
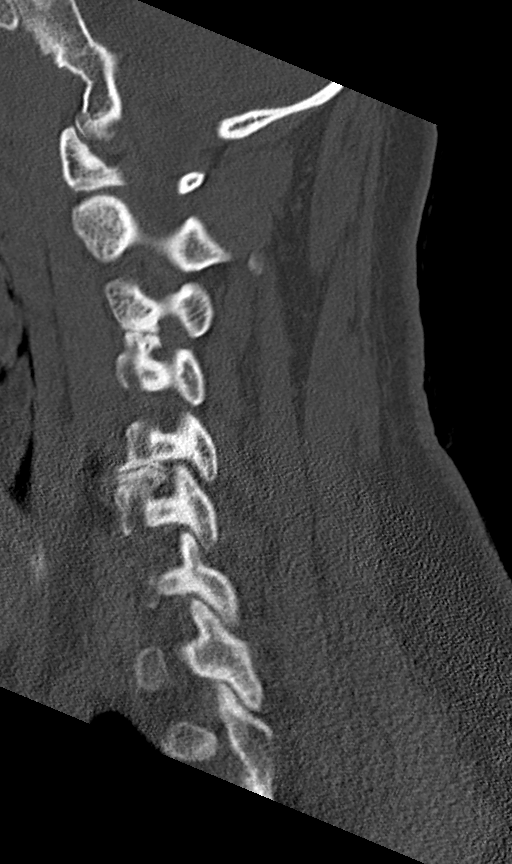
[im 41/61  bone]
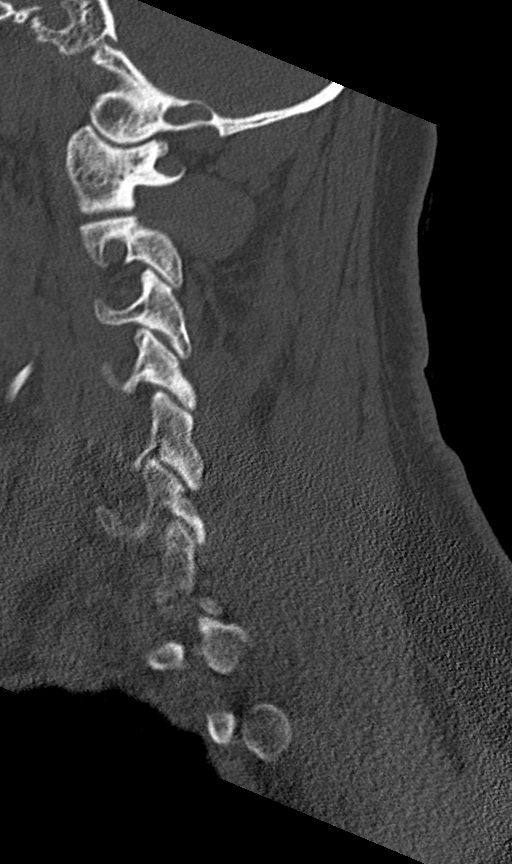

[Series 6: coronal bone · coronal · 0.23mm/px · 3 of 61 slices shown]
[im 14/61  bone]
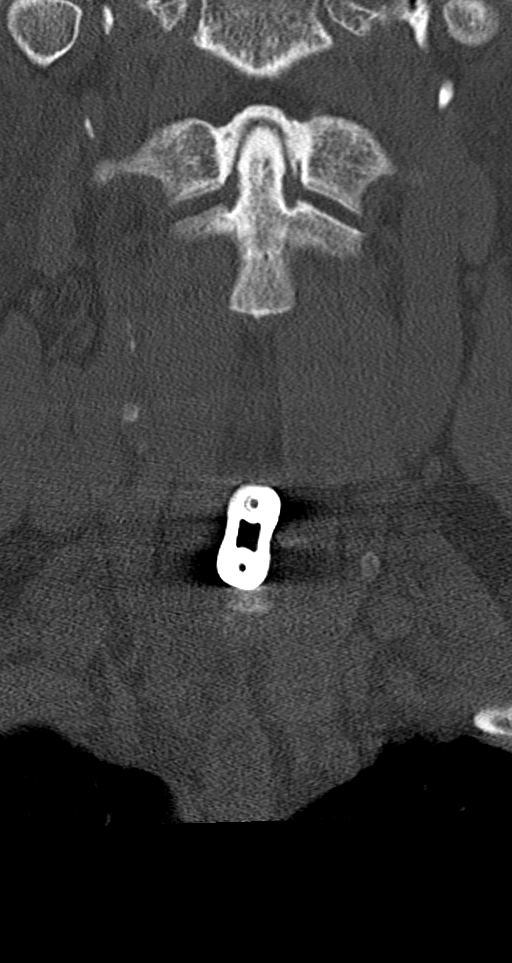
[im 25/61  bone]
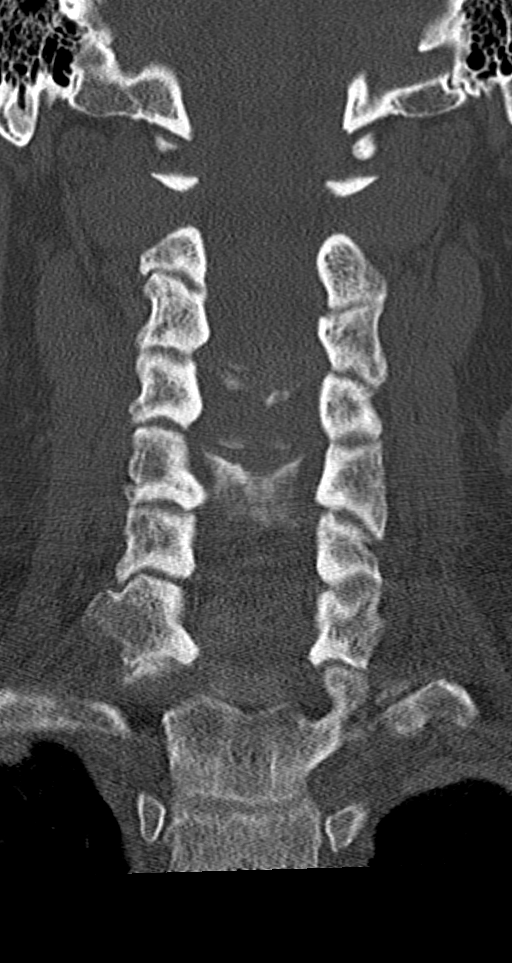
[im 36/61  bone]
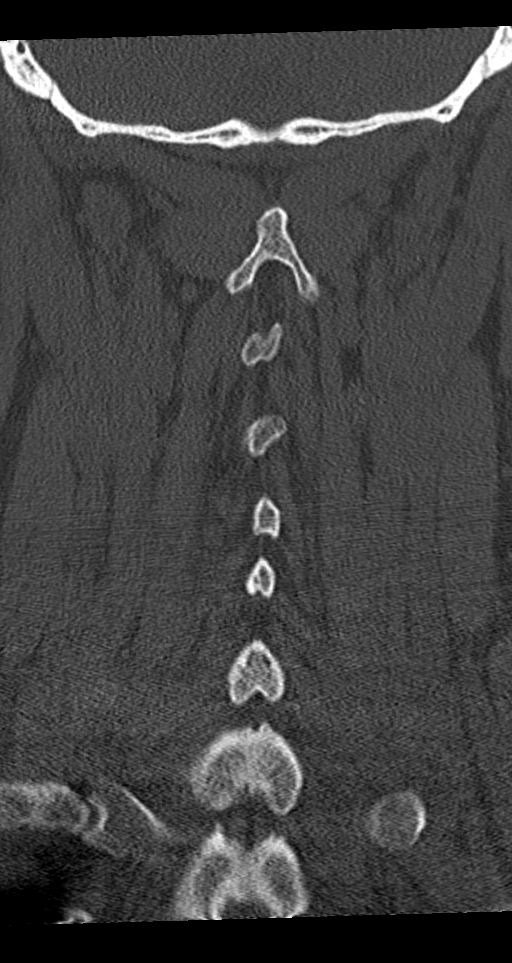

[Series 7: orthogonal axials · axial · 0.21mm/px · z∈[-115,-12]mm · 3 of 121 slices shown, 4 images]
[im 35/121  soft-tissue]
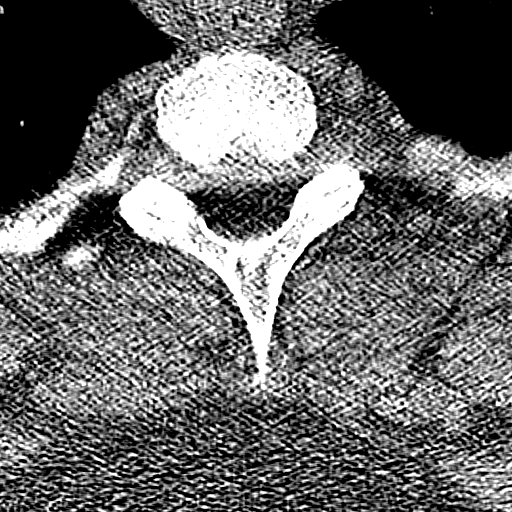
[im 35/121  bone]
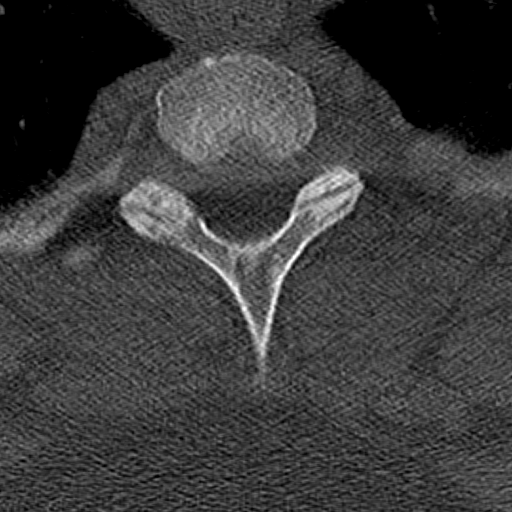
[im 69/121  bone]
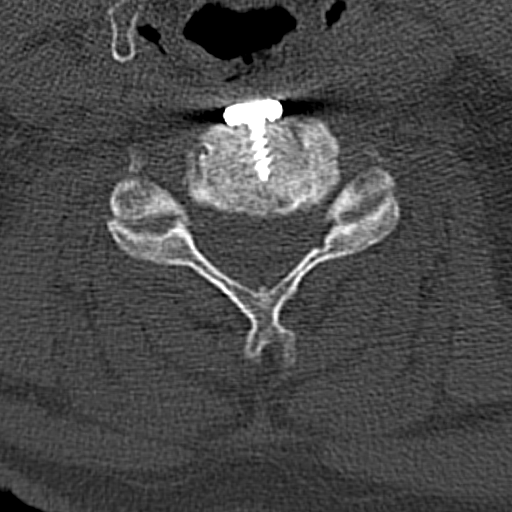
[im 103/121  bone]
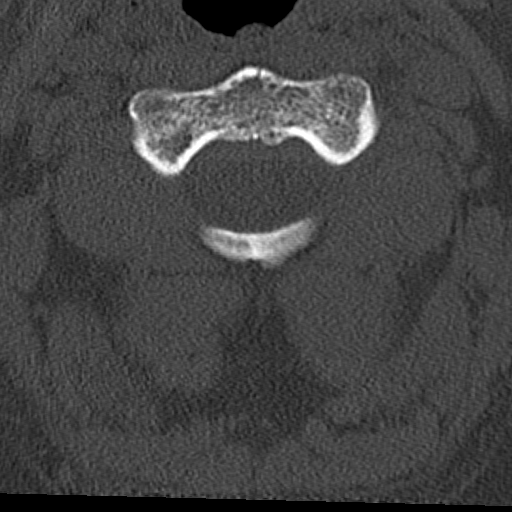

[11 of 33 positions shown; findings below may reference images not displayed]

FINDINGS: CT HEAD FINDINGS

Brain: There is no mass, hemorrhage or extra-axial collection. The
size and configuration of the ventricles and extra-axial CSF spaces
are normal. Old left frontal operculum infarct.

Vascular: No abnormal hyperdensity of the major intracranial
arteries or dural venous sinuses. No intracranial atherosclerosis.

Skull: The visualized skull base, calvarium and extracranial soft
tissues are normal.

Sinuses/Orbits: No fluid levels or advanced mucosal thickening of
the visualized paranasal sinuses. No mastoid or middle ear effusion.
The orbits are normal.

CT CERVICAL SPINE FINDINGS

Alignment: No static subluxation. Facets are aligned. Occipital
condyles are normally positioned. Reversal of normal cervical
lordosis may be positional or due to muscle spasm.

Skull base and vertebrae: No acute fracture.

Soft tissues and spinal canal: No prevertebral fluid or swelling. No
visible canal hematoma.

Disc levels: No advanced spinal canal or neural foraminal stenosis.
C5-6 ACDF

Upper chest: No pneumothorax, pulmonary nodule or pleural effusion.

Other: Normal visualized paraspinal cervical soft tissues.
IMPRESSION: 1. No acute intracranial abnormality.
2. Old left frontal operculum infarct.
3. No acute fracture or static subluxation of the cervical spine.

## 2021-09-24 DIAGNOSIS — M5400 Panniculitis affecting regions of neck and back, site unspecified: Secondary | ICD-10-CM | POA: Diagnosis not present

## 2021-09-25 ENCOUNTER — Other Ambulatory Visit: Payer: Self-pay | Admitting: Family Medicine

## 2021-09-25 DIAGNOSIS — M5400 Panniculitis affecting regions of neck and back, site unspecified: Secondary | ICD-10-CM | POA: Diagnosis not present

## 2021-09-25 DIAGNOSIS — G4700 Insomnia, unspecified: Secondary | ICD-10-CM

## 2021-09-26 DIAGNOSIS — M5400 Panniculitis affecting regions of neck and back, site unspecified: Secondary | ICD-10-CM | POA: Diagnosis not present

## 2021-09-29 DIAGNOSIS — M545 Low back pain, unspecified: Secondary | ICD-10-CM | POA: Diagnosis not present

## 2021-09-29 DIAGNOSIS — R200 Anesthesia of skin: Secondary | ICD-10-CM | POA: Diagnosis not present

## 2021-09-29 DIAGNOSIS — G8929 Other chronic pain: Secondary | ICD-10-CM | POA: Diagnosis not present

## 2021-09-29 DIAGNOSIS — M4854XA Collapsed vertebra, not elsewhere classified, thoracic region, initial encounter for fracture: Secondary | ICD-10-CM | POA: Diagnosis not present

## 2021-09-29 DIAGNOSIS — E119 Type 2 diabetes mellitus without complications: Secondary | ICD-10-CM | POA: Diagnosis not present

## 2021-09-29 DIAGNOSIS — M5400 Panniculitis affecting regions of neck and back, site unspecified: Secondary | ICD-10-CM | POA: Diagnosis not present

## 2021-09-29 DIAGNOSIS — M549 Dorsalgia, unspecified: Secondary | ICD-10-CM | POA: Diagnosis not present

## 2021-09-29 DIAGNOSIS — M4856XA Collapsed vertebra, not elsewhere classified, lumbar region, initial encounter for fracture: Secondary | ICD-10-CM | POA: Diagnosis not present
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
## 2021-09-30 DIAGNOSIS — M5400 Panniculitis affecting regions of neck and back, site unspecified: Secondary | ICD-10-CM | POA: Diagnosis not present

## 2021-10-01 DIAGNOSIS — M5400 Panniculitis affecting regions of neck and back, site unspecified: Secondary | ICD-10-CM | POA: Diagnosis not present

## 2021-10-02 DIAGNOSIS — M5400 Panniculitis affecting regions of neck and back, site unspecified: Secondary | ICD-10-CM | POA: Diagnosis not present

## 2021-10-03 DIAGNOSIS — M5400 Panniculitis affecting regions of neck and back, site unspecified: Secondary | ICD-10-CM | POA: Diagnosis not present

## 2021-10-06 DIAGNOSIS — M5400 Panniculitis affecting regions of neck and back, site unspecified: Secondary | ICD-10-CM | POA: Diagnosis not present

## 2021-10-07 DIAGNOSIS — M5400 Panniculitis affecting regions of neck and back, site unspecified: Secondary | ICD-10-CM | POA: Diagnosis not present

## 2021-10-08 DIAGNOSIS — M5400 Panniculitis affecting regions of neck and back, site unspecified: Secondary | ICD-10-CM | POA: Diagnosis not present

## 2021-10-09 DIAGNOSIS — M5400 Panniculitis affecting regions of neck and back, site unspecified: Secondary | ICD-10-CM | POA: Diagnosis not present

## 2021-10-10 DIAGNOSIS — M5400 Panniculitis affecting regions of neck and back, site unspecified: Secondary | ICD-10-CM | POA: Diagnosis not present

## 2021-10-13 ENCOUNTER — Ambulatory Visit: Payer: Medicaid Other | Admitting: Physician Assistant

## 2021-10-13 DIAGNOSIS — M5400 Panniculitis affecting regions of neck and back, site unspecified: Secondary | ICD-10-CM | POA: Diagnosis not present

## 2021-10-13 DIAGNOSIS — I10 Essential (primary) hypertension: Secondary | ICD-10-CM

## 2021-10-13 DIAGNOSIS — I480 Paroxysmal atrial fibrillation: Secondary | ICD-10-CM

## 2021-10-13 DIAGNOSIS — I6522 Occlusion and stenosis of left carotid artery: Secondary | ICD-10-CM

## 2021-10-14 DIAGNOSIS — M5400 Panniculitis affecting regions of neck and back, site unspecified: Secondary | ICD-10-CM | POA: Diagnosis not present

## 2021-10-15 DIAGNOSIS — M5400 Panniculitis affecting regions of neck and back, site unspecified: Secondary | ICD-10-CM | POA: Diagnosis not present

## 2021-10-16 DIAGNOSIS — M5400 Panniculitis affecting regions of neck and back, site unspecified: Secondary | ICD-10-CM | POA: Diagnosis not present

## 2021-10-17 DIAGNOSIS — M5400 Panniculitis affecting regions of neck and back, site unspecified: Secondary | ICD-10-CM | POA: Diagnosis not present

## 2021-10-20 DIAGNOSIS — M5400 Panniculitis affecting regions of neck and back, site unspecified: Secondary | ICD-10-CM | POA: Diagnosis not present

## 2021-10-21 DIAGNOSIS — M5400 Panniculitis affecting regions of neck and back, site unspecified: Secondary | ICD-10-CM | POA: Diagnosis not present

## 2021-10-22 DIAGNOSIS — M5400 Panniculitis affecting regions of neck and back, site unspecified: Secondary | ICD-10-CM | POA: Diagnosis not present

## 2021-10-23 DIAGNOSIS — G4700 Insomnia, unspecified: Secondary | ICD-10-CM | POA: Diagnosis not present

## 2021-10-23 DIAGNOSIS — I4891 Unspecified atrial fibrillation: Secondary | ICD-10-CM | POA: Diagnosis not present

## 2021-10-23 DIAGNOSIS — M5416 Radiculopathy, lumbar region: Secondary | ICD-10-CM | POA: Diagnosis not present

## 2021-10-23 DIAGNOSIS — Z0001 Encounter for general adult medical examination with abnormal findings: Secondary | ICD-10-CM | POA: Diagnosis not present

## 2021-10-23 DIAGNOSIS — E119 Type 2 diabetes mellitus without complications: Secondary | ICD-10-CM | POA: Diagnosis not present

## 2021-10-23 DIAGNOSIS — M5400 Panniculitis affecting regions of neck and back, site unspecified: Secondary | ICD-10-CM | POA: Diagnosis not present

## 2021-10-23 DIAGNOSIS — I10 Essential (primary) hypertension: Secondary | ICD-10-CM | POA: Diagnosis not present

## 2021-10-23 DIAGNOSIS — C61 Malignant neoplasm of prostate: Secondary | ICD-10-CM | POA: Diagnosis not present

## 2021-10-23 DIAGNOSIS — D2260 Melanocytic nevi of unspecified upper limb, including shoulder: Secondary | ICD-10-CM | POA: Diagnosis not present

## 2021-10-24 DIAGNOSIS — M5400 Panniculitis affecting regions of neck and back, site unspecified: Secondary | ICD-10-CM | POA: Diagnosis not present

## 2021-10-25 ENCOUNTER — Other Ambulatory Visit: Payer: Self-pay | Admitting: Family Medicine

## 2021-10-27 DIAGNOSIS — M5400 Panniculitis affecting regions of neck and back, site unspecified: Secondary | ICD-10-CM | POA: Diagnosis not present

## 2021-10-28 DIAGNOSIS — M5400 Panniculitis affecting regions of neck and back, site unspecified: Secondary | ICD-10-CM | POA: Diagnosis not present

## 2021-10-29 DIAGNOSIS — M5400 Panniculitis affecting regions of neck and back, site unspecified: Secondary | ICD-10-CM | POA: Diagnosis not present

## 2021-10-30 DIAGNOSIS — M5400 Panniculitis affecting regions of neck and back, site unspecified: Secondary | ICD-10-CM | POA: Diagnosis not present

## 2021-10-31 DIAGNOSIS — M5400 Panniculitis affecting regions of neck and back, site unspecified: Secondary | ICD-10-CM | POA: Diagnosis not present

## 2021-11-03 DIAGNOSIS — M5400 Panniculitis affecting regions of neck and back, site unspecified: Secondary | ICD-10-CM | POA: Diagnosis not present

## 2021-11-04 DIAGNOSIS — M5400 Panniculitis affecting regions of neck and back, site unspecified: Secondary | ICD-10-CM | POA: Diagnosis not present

## 2021-11-05 DIAGNOSIS — M5400 Panniculitis affecting regions of neck and back, site unspecified: Secondary | ICD-10-CM | POA: Diagnosis not present

## 2021-11-06 DIAGNOSIS — M5400 Panniculitis affecting regions of neck and back, site unspecified: Secondary | ICD-10-CM | POA: Diagnosis not present

## 2021-11-07 DIAGNOSIS — M5400 Panniculitis affecting regions of neck and back, site unspecified: Secondary | ICD-10-CM | POA: Diagnosis not present

## 2021-11-09 DIAGNOSIS — R55 Syncope and collapse: Secondary | ICD-10-CM | POA: Diagnosis not present

## 2021-11-09 DIAGNOSIS — Z609 Problem related to social environment, unspecified: Secondary | ICD-10-CM | POA: Diagnosis not present

## 2021-11-09 DIAGNOSIS — Z7984 Long term (current) use of oral hypoglycemic drugs: Secondary | ICD-10-CM | POA: Diagnosis not present

## 2021-11-09 DIAGNOSIS — M25561 Pain in right knee: Secondary | ICD-10-CM | POA: Diagnosis not present

## 2021-11-09 DIAGNOSIS — R531 Weakness: Secondary | ICD-10-CM | POA: Diagnosis not present

## 2021-11-09 DIAGNOSIS — I4510 Unspecified right bundle-branch block: Secondary | ICD-10-CM | POA: Diagnosis not present

## 2021-11-09 DIAGNOSIS — R569 Unspecified convulsions: Secondary | ICD-10-CM | POA: Diagnosis not present

## 2021-11-09 DIAGNOSIS — M25562 Pain in left knee: Secondary | ICD-10-CM | POA: Diagnosis not present

## 2021-11-09 DIAGNOSIS — R42 Dizziness and giddiness: Secondary | ICD-10-CM | POA: Diagnosis not present

## 2021-11-10 ENCOUNTER — Telehealth: Payer: Self-pay

## 2021-11-10 DIAGNOSIS — M5400 Panniculitis affecting regions of neck and back, site unspecified: Secondary | ICD-10-CM | POA: Diagnosis not present

## 2021-11-10 NOTE — Telephone Encounter (Signed)
Transition Care Management Follow-up Telephone Call ?Date of discharge and from where: 11/09/2021-[facility]  ?How have you been since you were released from the hospital? Pt stated he is doing fine but he left before getting his discharge instructions. Patient has been informed of instructions.  ?Any questions or concerns? No ? ?Items Reviewed: ?Did the pt receive and understand the discharge instructions provided?  Patient left before getting discharge instructions.  ?Medications obtained and verified? No  ?Other? No  ?Any new allergies since your discharge? No  ?Dietary orders reviewed? No ?Do you have support at home? Yes  ? ?Home Care and Equipment/Supplies: ?Were home health services ordered? not applicable ?If so, what is the name of the agency? N/A  ?Has the agency set up a time to come to the patient's home? not applicable ?Were any new equipment or medical supplies ordered?  No ?What is the name of the medical supply agency? N/A ?Were you able to get the supplies/equipment? not applicable ?Do you have any questions related to the use of the equipment or supplies? No ? ?Functional Questionnaire: (I = Independent and D = Dependent) ?ADLs: I ? ?Bathing/Dressing- I ? ?Meal Prep- I ? ?Eating- I ? ?Maintaining continence- I ? ?Transferring/Ambulation- I ? ?Managing Meds- I ? ?Follow up appointments reviewed: ? ?PCP Hospital f/u appt confirmed? No   ?Specialist Hospital f/u appt confirmed? No  ?Are transportation arrangements needed? No  ?If their condition worsens, is the pt aware to call PCP or go to the Emergency Dept.? Yes ?Was the patient provided with contact information for the PCP's office or ED? Yes ?Was to pt encouraged to call back with questions or concerns? Yes  ?

## 2021-11-11 DIAGNOSIS — M5400 Panniculitis affecting regions of neck and back, site unspecified: Secondary | ICD-10-CM | POA: Diagnosis not present

## 2021-11-12 DIAGNOSIS — M5400 Panniculitis affecting regions of neck and back, site unspecified: Secondary | ICD-10-CM | POA: Diagnosis not present

## 2021-11-13 DIAGNOSIS — M5400 Panniculitis affecting regions of neck and back, site unspecified: Secondary | ICD-10-CM | POA: Diagnosis not present
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
## 2021-11-17 DIAGNOSIS — M5400 Panniculitis affecting regions of neck and back, site unspecified: Secondary | ICD-10-CM | POA: Diagnosis not present

## 2021-11-18 DIAGNOSIS — M5400 Panniculitis affecting regions of neck and back, site unspecified: Secondary | ICD-10-CM | POA: Diagnosis not present

## 2021-11-19 DIAGNOSIS — M5400 Panniculitis affecting regions of neck and back, site unspecified: Secondary | ICD-10-CM | POA: Diagnosis not present

## 2021-11-20 DIAGNOSIS — M5400 Panniculitis affecting regions of neck and back, site unspecified: Secondary | ICD-10-CM | POA: Diagnosis not present

## 2021-11-24 DIAGNOSIS — M5400 Panniculitis affecting regions of neck and back, site unspecified: Secondary | ICD-10-CM | POA: Diagnosis not present

## 2021-11-25 DIAGNOSIS — M5400 Panniculitis affecting regions of neck and back, site unspecified: Secondary | ICD-10-CM | POA: Diagnosis not present

## 2021-11-26 DIAGNOSIS — M5400 Panniculitis affecting regions of neck and back, site unspecified: Secondary | ICD-10-CM | POA: Diagnosis not present

## 2021-11-27 DIAGNOSIS — E785 Hyperlipidemia, unspecified: Secondary | ICD-10-CM | POA: Diagnosis not present

## 2021-11-27 DIAGNOSIS — M5400 Panniculitis affecting regions of neck and back, site unspecified: Secondary | ICD-10-CM | POA: Diagnosis not present

## 2021-11-27 DIAGNOSIS — E119 Type 2 diabetes mellitus without complications: Secondary | ICD-10-CM | POA: Diagnosis not present

## 2021-11-27 DIAGNOSIS — Z1329 Encounter for screening for other suspected endocrine disorder: Secondary | ICD-10-CM | POA: Diagnosis not present

## 2021-11-27 DIAGNOSIS — Z13228 Encounter for screening for other metabolic disorders: Secondary | ICD-10-CM | POA: Diagnosis not present

## 2021-11-27 DIAGNOSIS — Z130 Encounter for screening for diseases of the blood and blood-forming organs and certain disorders involving the immune mechanism: Secondary | ICD-10-CM | POA: Diagnosis not present

## 2021-12-01 DIAGNOSIS — M5400 Panniculitis affecting regions of neck and back, site unspecified: Secondary | ICD-10-CM | POA: Diagnosis not present

## 2021-12-02 DIAGNOSIS — M5400 Panniculitis affecting regions of neck and back, site unspecified: Secondary | ICD-10-CM | POA: Diagnosis not present

## 2021-12-03 DIAGNOSIS — M5400 Panniculitis affecting regions of neck and back, site unspecified: Secondary | ICD-10-CM | POA: Diagnosis not present

## 2021-12-04 DIAGNOSIS — M5400 Panniculitis affecting regions of neck and back, site unspecified: Secondary | ICD-10-CM | POA: Diagnosis not present

## 2022-01-26 DIAGNOSIS — M5400 Panniculitis affecting regions of neck and back, site unspecified: Secondary | ICD-10-CM | POA: Diagnosis not present

## 2022-01-27 DIAGNOSIS — M5400 Panniculitis affecting regions of neck and back, site unspecified: Secondary | ICD-10-CM | POA: Diagnosis not present

## 2022-01-28 DIAGNOSIS — M5400 Panniculitis affecting regions of neck and back, site unspecified: Secondary | ICD-10-CM | POA: Diagnosis not present

## 2022-01-29 DIAGNOSIS — M5400 Panniculitis affecting regions of neck and back, site unspecified: Secondary | ICD-10-CM | POA: Diagnosis not present

## 2022-01-30 DIAGNOSIS — M5400 Panniculitis affecting regions of neck and back, site unspecified: Secondary | ICD-10-CM | POA: Diagnosis not present

## 2022-02-02 DIAGNOSIS — M5400 Panniculitis affecting regions of neck and back, site unspecified: Secondary | ICD-10-CM | POA: Diagnosis not present

## 2022-02-03 DIAGNOSIS — M5400 Panniculitis affecting regions of neck and back, site unspecified: Secondary | ICD-10-CM | POA: Diagnosis not present

## 2022-02-04 DIAGNOSIS — M5400 Panniculitis affecting regions of neck and back, site unspecified: Secondary | ICD-10-CM | POA: Diagnosis not present

## 2022-02-05 DIAGNOSIS — M5400 Panniculitis affecting regions of neck and back, site unspecified: Secondary | ICD-10-CM | POA: Diagnosis not present

## 2022-02-06 DIAGNOSIS — M5400 Panniculitis affecting regions of neck and back, site unspecified: Secondary | ICD-10-CM | POA: Diagnosis not present

## 2022-02-09 DIAGNOSIS — M5400 Panniculitis affecting regions of neck and back, site unspecified: Secondary | ICD-10-CM | POA: Diagnosis not present

## 2022-02-10 DIAGNOSIS — M5400 Panniculitis affecting regions of neck and back, site unspecified: Secondary | ICD-10-CM | POA: Diagnosis not present

## 2022-02-11 DIAGNOSIS — M5400 Panniculitis affecting regions of neck and back, site unspecified: Secondary | ICD-10-CM | POA: Diagnosis not present

## 2022-02-12 DIAGNOSIS — M5400 Panniculitis affecting regions of neck and back, site unspecified: Secondary | ICD-10-CM | POA: Diagnosis not present

## 2022-02-13 DIAGNOSIS — M5400 Panniculitis affecting regions of neck and back, site unspecified: Secondary | ICD-10-CM | POA: Diagnosis not present

## 2022-02-16 DIAGNOSIS — M5400 Panniculitis affecting regions of neck and back, site unspecified: Secondary | ICD-10-CM | POA: Diagnosis not present

## 2022-02-17 DIAGNOSIS — M5400 Panniculitis affecting regions of neck and back, site unspecified: Secondary | ICD-10-CM | POA: Diagnosis not present

## 2022-02-18 DIAGNOSIS — M5400 Panniculitis affecting regions of neck and back, site unspecified: Secondary | ICD-10-CM | POA: Diagnosis not present

## 2022-02-19 DIAGNOSIS — M5400 Panniculitis affecting regions of neck and back, site unspecified: Secondary | ICD-10-CM | POA: Diagnosis not present

## 2022-02-20 DIAGNOSIS — M5400 Panniculitis affecting regions of neck and back, site unspecified: Secondary | ICD-10-CM | POA: Diagnosis not present

## 2022-02-23 DIAGNOSIS — M5400 Panniculitis affecting regions of neck and back, site unspecified: Secondary | ICD-10-CM | POA: Diagnosis not present

## 2022-02-24 DIAGNOSIS — M5400 Panniculitis affecting regions of neck and back, site unspecified: Secondary | ICD-10-CM | POA: Diagnosis not present

## 2022-02-25 DIAGNOSIS — M5400 Panniculitis affecting regions of neck and back, site unspecified: Secondary | ICD-10-CM | POA: Diagnosis not present

## 2022-02-26 DIAGNOSIS — M5400 Panniculitis affecting regions of neck and back, site unspecified: Secondary | ICD-10-CM | POA: Diagnosis not present

## 2022-02-27 DIAGNOSIS — M5400 Panniculitis affecting regions of neck and back, site unspecified: Secondary | ICD-10-CM | POA: Diagnosis not present

## 2022-03-02 DIAGNOSIS — M5400 Panniculitis affecting regions of neck and back, site unspecified: Secondary | ICD-10-CM | POA: Diagnosis not present

## 2022-03-03 DIAGNOSIS — M5400 Panniculitis affecting regions of neck and back, site unspecified: Secondary | ICD-10-CM | POA: Diagnosis not present

## 2022-03-04 DIAGNOSIS — M5400 Panniculitis affecting regions of neck and back, site unspecified: Secondary | ICD-10-CM | POA: Diagnosis not present

## 2022-03-05 DIAGNOSIS — M5400 Panniculitis affecting regions of neck and back, site unspecified: Secondary | ICD-10-CM | POA: Diagnosis not present

## 2022-03-06 DIAGNOSIS — M5400 Panniculitis affecting regions of neck and back, site unspecified: Secondary | ICD-10-CM | POA: Diagnosis not present

## 2022-03-09 DIAGNOSIS — M5400 Panniculitis affecting regions of neck and back, site unspecified: Secondary | ICD-10-CM | POA: Diagnosis not present

## 2022-03-10 DIAGNOSIS — M5400 Panniculitis affecting regions of neck and back, site unspecified: Secondary | ICD-10-CM | POA: Diagnosis not present

## 2022-03-11 DIAGNOSIS — M5400 Panniculitis affecting regions of neck and back, site unspecified: Secondary | ICD-10-CM | POA: Diagnosis not present

## 2022-03-12 DIAGNOSIS — M5400 Panniculitis affecting regions of neck and back, site unspecified: Secondary | ICD-10-CM | POA: Diagnosis not present

## 2022-03-13 DIAGNOSIS — M5400 Panniculitis affecting regions of neck and back, site unspecified: Secondary | ICD-10-CM | POA: Diagnosis not present

## 2022-03-16 DIAGNOSIS — M5400 Panniculitis affecting regions of neck and back, site unspecified: Secondary | ICD-10-CM | POA: Diagnosis not present

## 2022-03-17 DIAGNOSIS — M5400 Panniculitis affecting regions of neck and back, site unspecified: Secondary | ICD-10-CM | POA: Diagnosis not present

## 2022-03-18 DIAGNOSIS — M5400 Panniculitis affecting regions of neck and back, site unspecified: Secondary | ICD-10-CM | POA: Diagnosis not present

## 2022-03-19 DIAGNOSIS — M5400 Panniculitis affecting regions of neck and back, site unspecified: Secondary | ICD-10-CM | POA: Diagnosis not present

## 2022-03-20 DIAGNOSIS — M5400 Panniculitis affecting regions of neck and back, site unspecified: Secondary | ICD-10-CM | POA: Diagnosis not present

## 2022-03-23 DIAGNOSIS — M5400 Panniculitis affecting regions of neck and back, site unspecified: Secondary | ICD-10-CM | POA: Diagnosis not present

## 2022-03-24 DIAGNOSIS — M5400 Panniculitis affecting regions of neck and back, site unspecified: Secondary | ICD-10-CM | POA: Diagnosis not present

## 2022-03-25 DIAGNOSIS — M5400 Panniculitis affecting regions of neck and back, site unspecified: Secondary | ICD-10-CM | POA: Diagnosis not present

## 2022-04-15 ENCOUNTER — Encounter: Payer: Self-pay | Admitting: Neurology

## 2022-04-26 ENCOUNTER — Other Ambulatory Visit: Payer: Self-pay | Admitting: Family Medicine

## 2022-05-04 ENCOUNTER — Telehealth: Payer: Self-pay

## 2022-05-04 NOTE — Telephone Encounter (Signed)
Magda Paganini was very rude when returning call stating "she has documentation that we have been refilling patient trazodone and eliquis and she is calling to see what's up with that" I let her know that if there was refills already on file the patient picked up we are unaware of that but the last refill that was sent by [place] is august 19 of 2022 with 180 tablets and 3 refills for trazadone and 180 tablets with 3 refills for the eliquis. Magda Paganini verbalized understanding and disconnected the phone.

## 2022-05-21 ENCOUNTER — Ambulatory Visit: Payer: Medicaid Other | Admitting: Neurology

## 2022-07-03 DIAGNOSIS — D2261 Melanocytic nevi of right upper limb, including shoulder: Secondary | ICD-10-CM | POA: Diagnosis not present

## 2022-07-03 DIAGNOSIS — D225 Melanocytic nevi of trunk: Secondary | ICD-10-CM | POA: Diagnosis not present

## 2022-07-03 DIAGNOSIS — D2271 Melanocytic nevi of right lower limb, including hip: Secondary | ICD-10-CM | POA: Diagnosis not present

## 2022-07-03 DIAGNOSIS — D2272 Melanocytic nevi of left lower limb, including hip: Secondary | ICD-10-CM | POA: Diagnosis not present

## 2022-07-03 DIAGNOSIS — D2262 Melanocytic nevi of left upper limb, including shoulder: Secondary | ICD-10-CM | POA: Diagnosis not present

## 2022-07-03 DIAGNOSIS — L821 Other seborrheic keratosis: Secondary | ICD-10-CM | POA: Diagnosis not present

## 2022-07-03 DIAGNOSIS — L820 Inflamed seborrheic keratosis: Secondary | ICD-10-CM | POA: Diagnosis not present

## 2022-07-03 DIAGNOSIS — L218 Other seborrheic dermatitis: Secondary | ICD-10-CM | POA: Diagnosis not present

## 2022-07-03 DIAGNOSIS — L538 Other specified erythematous conditions: Secondary | ICD-10-CM | POA: Diagnosis not present

## 2022-08-05 DIAGNOSIS — E559 Vitamin D deficiency, unspecified: Secondary | ICD-10-CM | POA: Diagnosis not present

## 2022-08-05 DIAGNOSIS — R0602 Shortness of breath: Secondary | ICD-10-CM | POA: Diagnosis not present

## 2022-08-05 DIAGNOSIS — I10 Essential (primary) hypertension: Secondary | ICD-10-CM | POA: Diagnosis not present

## 2022-08-05 DIAGNOSIS — E785 Hyperlipidemia, unspecified: Secondary | ICD-10-CM | POA: Diagnosis not present

## 2022-08-05 DIAGNOSIS — E118 Type 2 diabetes mellitus with unspecified complications: Secondary | ICD-10-CM | POA: Diagnosis not present

## 2022-08-26 ENCOUNTER — Other Ambulatory Visit: Payer: Self-pay | Admitting: Family Medicine

## 2022-08-26 DIAGNOSIS — F411 Generalized anxiety disorder: Secondary | ICD-10-CM

## 2022-08-28 ENCOUNTER — Other Ambulatory Visit: Payer: Self-pay | Admitting: Family Medicine

## 2022-09-02 ENCOUNTER — Other Ambulatory Visit: Payer: Self-pay | Admitting: Family Medicine

## 2022-09-23 DIAGNOSIS — R55 Syncope and collapse: Secondary | ICD-10-CM | POA: Diagnosis not present

## 2022-09-23 DIAGNOSIS — R0602 Shortness of breath: Secondary | ICD-10-CM | POA: Diagnosis not present

## 2022-09-24 ENCOUNTER — Encounter: Payer: Self-pay | Admitting: Radiology

## 2022-10-02 DIAGNOSIS — M129 Arthropathy, unspecified: Secondary | ICD-10-CM | POA: Diagnosis not present

## 2022-10-02 DIAGNOSIS — R7303 Prediabetes: Secondary | ICD-10-CM | POA: Diagnosis not present

## 2022-10-02 DIAGNOSIS — Z79899 Other long term (current) drug therapy: Secondary | ICD-10-CM | POA: Diagnosis not present

## 2022-10-02 DIAGNOSIS — E559 Vitamin D deficiency, unspecified: Secondary | ICD-10-CM | POA: Diagnosis not present

## 2022-10-09 DIAGNOSIS — Z79899 Other long term (current) drug therapy: Secondary | ICD-10-CM | POA: Diagnosis not present

## 2022-10-19 DIAGNOSIS — I4891 Unspecified atrial fibrillation: Secondary | ICD-10-CM | POA: Diagnosis not present

## 2022-10-19 DIAGNOSIS — R069 Unspecified abnormalities of breathing: Secondary | ICD-10-CM | POA: Diagnosis not present

## 2022-10-19 DIAGNOSIS — R0789 Other chest pain: Secondary | ICD-10-CM | POA: Diagnosis not present

## 2022-10-19 DIAGNOSIS — R0902 Hypoxemia: Secondary | ICD-10-CM | POA: Diagnosis not present

## 2022-10-19 DIAGNOSIS — R0602 Shortness of breath: Secondary | ICD-10-CM | POA: Diagnosis not present

## 2022-10-19 DIAGNOSIS — R062 Wheezing: Secondary | ICD-10-CM | POA: Diagnosis not present
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
## 2022-10-26 ENCOUNTER — Ambulatory Visit: Payer: Medicaid Other | Attending: Cardiology | Admitting: Cardiology

## 2022-10-26 DIAGNOSIS — I480 Paroxysmal atrial fibrillation: Secondary | ICD-10-CM

## 2022-10-28 ENCOUNTER — Encounter: Payer: Self-pay | Admitting: Cardiology

## 2022-11-22 DIAGNOSIS — R079 Chest pain, unspecified: Secondary | ICD-10-CM | POA: Diagnosis not present

## 2022-11-22 DIAGNOSIS — R911 Solitary pulmonary nodule: Secondary | ICD-10-CM | POA: Diagnosis not present

## 2022-11-22 DIAGNOSIS — I959 Hypotension, unspecified: Secondary | ICD-10-CM | POA: Diagnosis not present

## 2022-11-22 DIAGNOSIS — I10 Essential (primary) hypertension: Secondary | ICD-10-CM | POA: Diagnosis not present

## 2022-11-22 DIAGNOSIS — Z720 Tobacco use: Secondary | ICD-10-CM | POA: Diagnosis not present

## 2022-11-22 DIAGNOSIS — I4891 Unspecified atrial fibrillation: Secondary | ICD-10-CM | POA: Diagnosis not present

## 2022-11-22 DIAGNOSIS — R000 Tachycardia, unspecified: Secondary | ICD-10-CM | POA: Diagnosis not present

## 2022-11-25 DIAGNOSIS — Z79899 Other long term (current) drug therapy: Secondary | ICD-10-CM | POA: Diagnosis not present

## 2022-12-12 DIAGNOSIS — R531 Weakness: Secondary | ICD-10-CM | POA: Diagnosis not present

## 2022-12-12 DIAGNOSIS — R110 Nausea: Secondary | ICD-10-CM | POA: Diagnosis not present

## 2022-12-12 DIAGNOSIS — R109 Unspecified abdominal pain: Secondary | ICD-10-CM | POA: Diagnosis not present

## 2022-12-12 DIAGNOSIS — C7800 Secondary malignant neoplasm of unspecified lung: Secondary | ICD-10-CM | POA: Diagnosis not present

## 2022-12-12 DIAGNOSIS — R0602 Shortness of breath: Secondary | ICD-10-CM | POA: Diagnosis not present

## 2022-12-12 DIAGNOSIS — R5381 Other malaise: Secondary | ICD-10-CM | POA: Diagnosis not present

## 2022-12-15 ENCOUNTER — Other Ambulatory Visit: Payer: Self-pay

## 2022-12-15 DIAGNOSIS — C3490 Malignant neoplasm of unspecified part of unspecified bronchus or lung: Secondary | ICD-10-CM

## 2022-12-18 DIAGNOSIS — Z79899 Other long term (current) drug therapy: Secondary | ICD-10-CM | POA: Diagnosis not present

## 2022-12-23 DIAGNOSIS — C3492 Malignant neoplasm of unspecified part of left bronchus or lung: Secondary | ICD-10-CM | POA: Insufficient documentation

## 2022-12-24 ENCOUNTER — Inpatient Hospital Stay: Payer: Medicaid Other | Attending: Hematology | Admitting: Hematology

## 2022-12-24 ENCOUNTER — Inpatient Hospital Stay: Payer: Medicaid Other | Admitting: Hematology

## 2022-12-24 ENCOUNTER — Encounter: Payer: Self-pay | Admitting: Hematology

## 2022-12-24 ENCOUNTER — Other Ambulatory Visit: Payer: Self-pay

## 2022-12-24 ENCOUNTER — Inpatient Hospital Stay: Payer: Medicaid Other

## 2022-12-24 VITALS — BP 110/63 | HR 92 | Temp 97.6°F | Resp 18 | Ht 75.0 in | Wt 191.4 lb

## 2022-12-24 DIAGNOSIS — Z8546 Personal history of malignant neoplasm of prostate: Secondary | ICD-10-CM | POA: Diagnosis not present

## 2022-12-24 DIAGNOSIS — C7951 Secondary malignant neoplasm of bone: Secondary | ICD-10-CM

## 2022-12-24 DIAGNOSIS — C787 Secondary malignant neoplasm of liver and intrahepatic bile duct: Secondary | ICD-10-CM | POA: Insufficient documentation

## 2022-12-24 DIAGNOSIS — Z79899 Other long term (current) drug therapy: Secondary | ICD-10-CM | POA: Diagnosis not present

## 2022-12-24 DIAGNOSIS — Z923 Personal history of irradiation: Secondary | ICD-10-CM | POA: Insufficient documentation

## 2022-12-24 DIAGNOSIS — Z9079 Acquired absence of other genital organ(s): Secondary | ICD-10-CM | POA: Diagnosis not present

## 2022-12-24 DIAGNOSIS — F17210 Nicotine dependence, cigarettes, uncomplicated: Secondary | ICD-10-CM

## 2022-12-24 DIAGNOSIS — C3432 Malignant neoplasm of lower lobe, left bronchus or lung: Secondary | ICD-10-CM

## 2022-12-24 DIAGNOSIS — C3490 Malignant neoplasm of unspecified part of unspecified bronchus or lung: Secondary | ICD-10-CM

## 2022-12-24 LAB — CBC WITH DIFFERENTIAL/PLATELET
Abs Immature Granulocytes: 0.04 10*3/uL (ref 0.00–0.07)
Basophils Absolute: 0.1 10*3/uL (ref 0.0–0.1)
Basophils Relative: 1 %
Eosinophils Absolute: 0.3 10*3/uL (ref 0.0–0.5)
Eosinophils Relative: 3 %
HCT: 38.4 % — ABNORMAL LOW (ref 39.0–52.0)
Hemoglobin: 13.6 g/dL (ref 13.0–17.0)
Immature Granulocytes: 0 %
Lymphocytes Relative: 17 %
Lymphs Abs: 2.2 10*3/uL (ref 0.7–4.0)
MCH: 31.9 pg (ref 26.0–34.0)
MCHC: 35.4 g/dL (ref 30.0–36.0)
MCV: 89.9 fL (ref 80.0–100.0)
Monocytes Absolute: 1.1 10*3/uL — ABNORMAL HIGH (ref 0.1–1.0)
Monocytes Relative: 8 %
Neutro Abs: 9.1 10*3/uL — ABNORMAL HIGH (ref 1.7–7.7)
Neutrophils Relative %: 71 %
Platelets: 459 10*3/uL — ABNORMAL HIGH (ref 150–400)
RBC: 4.27 MIL/uL (ref 4.22–5.81)
RDW: 13.3 % (ref 11.5–15.5)
WBC: 12.8 10*3/uL — ABNORMAL HIGH (ref 4.0–10.5)
nRBC: 0 % (ref 0.0–0.2)

## 2022-12-24 LAB — COMPREHENSIVE METABOLIC PANEL
ALT: 14 U/L (ref 0–44)
AST: 18 U/L (ref 15–41)
Albumin: 4 g/dL (ref 3.5–5.0)
Alkaline Phosphatase: 93 U/L (ref 38–126)
Anion gap: 13 (ref 5–15)
BUN: 17 mg/dL (ref 8–23)
CO2: 21 mmol/L — ABNORMAL LOW (ref 22–32)
Calcium: 9.7 mg/dL (ref 8.9–10.3)
Chloride: 98 mmol/L (ref 98–111)
Creatinine, Ser: 1.25 mg/dL — ABNORMAL HIGH (ref 0.61–1.24)
GFR, Estimated: >60 mL/min (ref >60)
Glucose, Bld: 104 mg/dL — ABNORMAL HIGH (ref 70–99)
Potassium: 3.7 mmol/L (ref 3.5–5.1)
Sodium: 132 mmol/L — ABNORMAL LOW (ref 135–145)
Total Bilirubin: 0.7 mg/dL (ref 0.3–1.2)
Total Protein: 8.3 g/dL — ABNORMAL HIGH (ref 6.5–8.1)

## 2022-12-24 MED ORDER — OXYCODONE HCL 15 MG PO TABS: 15.0000 mg | ORAL_TABLET | Freq: Four times a day (QID) | ORAL | 0 refills | Status: DC | PRN

## 2022-12-24 MED ORDER — PROCHLORPERAZINE MALEATE 10 MG PO TABS: 10.0000 mg | ORAL_TABLET | Freq: Four times a day (QID) | ORAL | 3 refills | Status: DC | PRN

## 2022-12-24 NOTE — Progress Notes (Signed)
Wheelchair ordered for patient delivery via [employer].

## 2022-12-24 NOTE — Patient Instructions (Addendum)
[facility]  Discharge Instructions  You were seen and examined today by Dr. Ellin Saba. Dr. Ellin Saba is a medical oncologist, meaning that he specializes in the treatment of cancer diagnoses. Dr. Ellin Saba discussed your past medical history, family history of cancers, and the events that led to you being here today.  You were referred to Dr. Ellin Saba due to abnormal CT scan which revealed a left lung mass, enlarged lymph nodes, areas of note on your liver as well as your spine. This is highly concerning for metastatic lung cancer. It does not appear to be related to the previous diagnosis of prostate cancer.  Dr. Ellin Saba has recommended a brain MRI, PET scan and biopsy to accurately identify and stage the cancer.  Follow-up with Dr. Ellin Saba one week after the biopsy.  Thank you for choosing [facility] to provide your oncology and hematology care.   To afford each patient quality time with our provider, please arrive at least 15 minutes before your scheduled appointment time. You may need to reschedule your appointment if you arrive late (10 or more minutes). Arriving late affects you and other patients whose appointments are after yours.  Also, if you miss three or more appointments without notifying the office, you may be dismissed from the clinic at the provider's discretion.    Again, thank you for choosing [facility].  Our hope is that these requests will decrease the amount of time that you wait before being seen by our physicians.   If you have a lab appointment with the Cancer Center - please note that after April 8th, all labs will be drawn in the cancer center.  You do not have to check in or register with the main entrance as you have in the past but will complete your check-in at the cancer center.            _____________________________________________________________  Should you have questions after  your visit to [facility], please contact our office at [phone] and follow the prompts.  Our office hours are 8:00 a.m. to 4:30 p.m. Monday - Thursday and 8:00 a.m. to 2:30 p.m. Friday.  Please note that voicemails left after 4:00 p.m. may not be returned until the following business day.  We are closed weekends and all major holidays.  You do have access to a nurse 24-7, just call the main number to the clinic [phone] and do not press any options, hold on the line and a nurse will answer the phone.    For prescription refill requests, have your pharmacy contact our office and allow 72 hours.    Masks are no longer required in the cancer centers. If you would like for your care team to wear a mask while they are taking care of you, please let them know. You may have one support person who is at least 63 years old accompany you for your appointments.
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
## 2022-12-24 NOTE — Progress Notes (Signed)
Order placed for CT biopsy per Dr. Katragadda. 

## 2022-12-25 ENCOUNTER — Ambulatory Visit (HOSPITAL_BASED_OUTPATIENT_CLINIC_OR_DEPARTMENT_OTHER): Payer: Medicaid Other

## 2022-12-25 NOTE — Progress Notes (Signed)
Oley Balm, MD  Leodis Rains D PROCEDURE / BIOPSY REVIEW Date: 12/25/22  Requested Biopsy site: liver Reason for request: mets Imaging review: Best seen on CT 4.28.24 Im 162 Se 6  Decision: Approved Imaging modality to perform: Ultrasound Schedule with: Moderate Sedation Schedule for: Any VIR  Additional comments:   Please contact me with questions, concerns, or if issue pertaining to this request arise.  Dayne Oley Balm, MD [facility]

## 2022-12-25 NOTE — Progress Notes (Signed)
Patient may hold Eliquis for 2 to 3 days per Radiology request prior to [facility] placement and biopsy per Dr. Ellin Saba. Radiology aware.

## 2022-12-26 LAB — MISC LABCORP TEST (SEND OUT): Labcorp test code: 10322

## 2022-12-26 LAB — CEA: CEA: 2121 ng/mL — ABNORMAL HIGH (ref 0.0–4.7)

## 2022-12-28 ENCOUNTER — Encounter: Payer: Self-pay | Admitting: *Deleted

## 2022-12-28 NOTE — Progress Notes (Signed)
Oxycodone 15 mg tab approved by plan on 12/24/22 and is valid through 06/26/23.

## 2022-12-29 ENCOUNTER — Ambulatory Visit (HOSPITAL_COMMUNITY): Payer: Medicaid Other

## 2022-12-29 ENCOUNTER — Ambulatory Visit (HOSPITAL_COMMUNITY): Admission: RE | Admit: 2022-12-29 | Payer: Medicaid Other | Source: Ambulatory Visit

## 2022-12-29 ENCOUNTER — Ambulatory Visit (HOSPITAL_COMMUNITY): Payer: Medicaid Other | Attending: Hematology

## 2022-12-29 NOTE — Progress Notes (Deleted)
    Cardiology Office Note  Date: 12/29/2022   ID: Calvin Aguirre, DOB 1960-08-21, MRN [id number]  History of Present Illness: Calvin Aguirre is a 62 y.o. male last assessed in March 2021 and now presenting overdue for follow-up.  Physical Exam: VS:  There were no vitals taken for this visit., BMI There is no height or weight on file to calculate BMI.  Wt Readings from Last 3 Encounters:  12/24/22 191 lb 6.4 oz (86.8 kg)  09/10/21 269 lb (122 kg)  03/14/21 266 lb 3.2 oz (120.7 kg)    General: Patient appears comfortable at rest. HEENT: Conjunctiva and lids normal, oropharynx clear with moist mucosa. Neck: Supple, no elevated JVP or carotid bruits, no thyromegaly. Lungs: Clear to auscultation, nonlabored breathing at rest. Cardiac: Regular rate and rhythm, no S3 or significant systolic murmur, no pericardial rub. Abdomen: Soft, nontender, no hepatomegaly, bowel sounds present, no guarding or rebound. Extremities: No pitting edema, distal pulses 2+. Skin: Warm and dry. Musculoskeletal: No kyphosis. Neuropsychiatric: Alert and oriented x3, affect grossly appropriate.  ECG:  An ECG dated 03/14/2021 was personally reviewed today and demonstrated:  Sinus rhythm with right bundle branch block.  Labwork: 12/24/2022: ALT 14; AST 18; BUN 17; Creatinine, Ser 1.25; Hemoglobin 13.6; Platelets 459; Potassium 3.7; Sodium 132     Component Value Date/Time   CHOL 119 03/14/2021 1535   TRIG 288 (H) 03/14/2021 1535   HDL 29 (L) 03/14/2021 1535   CHOLHDL 4.1 03/14/2021 1535   CHOLHDL 7.8 05/18/2014 1000   VLDL UNABLE TO CALCULATE IF TRIGLYCERIDE OVER 400 mg/dL 40/98/1191 4782   LDLCALC 46 03/14/2021 1535   Other Studies Reviewed Today:  Carotid Dopplers 09/10/2021: Summary:  Right Carotid: Velocities in the right ICA are consistent with a 1-39%  stenosis.   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   Vertebrals:  Bilateral vertebral arteries demonstrate  antegrade flow.  Subclavians: Right subclavian artery flow was disturbed. Normal flow               hemodynamics were seen in the left subclavian artery.   Assessment and Plan:  1.  Paroxysmal atrial fibrillation with CHA2DS2-VASc score of 4.  2.  Essential hypertension.  3.  Carotid artery disease with chronic occlusion of the LICA and mild RICA stenosis most recently assessed by carotid Dopplers in February 2023.  Disposition:  Follow up {follow up:15908}  Signed, Jonelle Sidle, M.D., F.A.C.C. [facility]

## 2022-12-30 ENCOUNTER — Ambulatory Visit: Payer: Medicaid Other | Attending: Cardiology | Admitting: Cardiology

## 2022-12-30 DIAGNOSIS — I480 Paroxysmal atrial fibrillation: Secondary | ICD-10-CM

## 2022-12-30 LAB — PROTEIN ELECTROPHORESIS, SERUM
A/G Ratio: 0.9 (ref 0.7–1.7)
Albumin ELP: 3.6 g/dL (ref 2.9–4.4)
Alpha-1-Globulin: 0.4 g/dL (ref 0.0–0.4)
Alpha-2-Globulin: 1.2 g/dL — ABNORMAL HIGH (ref 0.4–1.0)
Beta Globulin: 1.2 g/dL (ref 0.7–1.3)
Gamma Globulin: 1.3 g/dL (ref 0.4–1.8)
Globulin, Total: 4 g/dL — ABNORMAL HIGH (ref 2.2–3.9)
Total Protein ELP: 7.6 g/dL (ref 6.0–8.5)

## 2022-12-31 ENCOUNTER — Encounter (HOSPITAL_COMMUNITY): Admission: RE | Admit: 2022-12-31 | Payer: Medicaid Other | Source: Ambulatory Visit

## 2023-01-04 ENCOUNTER — Other Ambulatory Visit: Payer: Self-pay | Admitting: Radiology

## 2023-01-04 DIAGNOSIS — R112 Nausea with vomiting, unspecified: Secondary | ICD-10-CM | POA: Diagnosis not present

## 2023-01-04 DIAGNOSIS — E860 Dehydration: Secondary | ICD-10-CM | POA: Diagnosis not present

## 2023-01-04 DIAGNOSIS — N179 Acute kidney failure, unspecified: Secondary | ICD-10-CM | POA: Diagnosis not present

## 2023-01-04 DIAGNOSIS — K769 Liver disease, unspecified: Secondary | ICD-10-CM

## 2023-01-04 DIAGNOSIS — K529 Noninfective gastroenteritis and colitis, unspecified: Secondary | ICD-10-CM | POA: Diagnosis not present

## 2023-01-04 DIAGNOSIS — I4891 Unspecified atrial fibrillation: Secondary | ICD-10-CM | POA: Diagnosis not present

## 2023-01-05 ENCOUNTER — Ambulatory Visit (HOSPITAL_COMMUNITY)
Admission: RE | Admit: 2023-01-05 | Discharge: 2023-01-05 | Disposition: A | Discharge status: Home / Self Care | Payer: Medicaid Other | Source: Ambulatory Visit | Attending: Hematology | Admitting: Hematology

## 2023-01-05 ENCOUNTER — Ambulatory Visit (HOSPITAL_COMMUNITY): Admission: RE | Admit: 2023-01-05 | Payer: Medicaid Other | Source: Ambulatory Visit

## 2023-01-05 NOTE — Progress Notes (Signed)
Call placed to patient today. Patient unable to get to biopsy appointment today. Patient tells me that he has been in and out of the hospital since his last visit with Dr. Ellin Saba. He tells me that is unable to schedule scans or biopsy at this time, that he feels far too sick. Patient tells me that he is considering Hospice Care at this time. Dr. Ellin Saba made aware of the above conversation and referral placed to [facility] and [facility] in [facility]. Patient aware and agreeable. No future appointments at this time, patient to follow-up with Hospice for ongoing medical management.

## 2023-01-06 ENCOUNTER — Other Ambulatory Visit: Payer: Self-pay | Admitting: Physician Assistant

## 2023-01-06 DIAGNOSIS — F32A Depression, unspecified: Secondary | ICD-10-CM | POA: Diagnosis not present

## 2023-01-06 DIAGNOSIS — R0602 Shortness of breath: Secondary | ICD-10-CM | POA: Diagnosis not present

## 2023-01-06 DIAGNOSIS — R112 Nausea with vomiting, unspecified: Secondary | ICD-10-CM | POA: Diagnosis not present

## 2023-01-06 DIAGNOSIS — R52 Pain, unspecified: Secondary | ICD-10-CM | POA: Diagnosis not present

## 2023-01-06 DIAGNOSIS — R630 Anorexia: Secondary | ICD-10-CM | POA: Diagnosis not present

## 2023-01-06 DIAGNOSIS — R531 Weakness: Secondary | ICD-10-CM | POA: Diagnosis not present

## 2023-01-06 DIAGNOSIS — I480 Paroxysmal atrial fibrillation: Secondary | ICD-10-CM | POA: Diagnosis not present

## 2023-01-06 DIAGNOSIS — F419 Anxiety disorder, unspecified: Secondary | ICD-10-CM | POA: Diagnosis not present

## 2023-01-06 DIAGNOSIS — Z8546 Personal history of malignant neoplasm of prostate: Secondary | ICD-10-CM | POA: Diagnosis not present

## 2023-01-06 DIAGNOSIS — I519 Heart disease, unspecified: Secondary | ICD-10-CM | POA: Diagnosis not present

## 2023-01-06 DIAGNOSIS — I10 Essential (primary) hypertension: Secondary | ICD-10-CM | POA: Diagnosis not present

## 2023-01-06 DIAGNOSIS — R42 Dizziness and giddiness: Secondary | ICD-10-CM | POA: Diagnosis not present

## 2023-01-06 DIAGNOSIS — C3490 Malignant neoplasm of unspecified part of unspecified bronchus or lung: Secondary | ICD-10-CM | POA: Diagnosis not present

## 2023-01-06 DIAGNOSIS — I639 Cerebral infarction, unspecified: Secondary | ICD-10-CM | POA: Diagnosis not present

## 2023-01-06 DIAGNOSIS — E785 Hyperlipidemia, unspecified: Secondary | ICD-10-CM | POA: Diagnosis not present

## 2023-01-06 DIAGNOSIS — K219 Gastro-esophageal reflux disease without esophagitis: Secondary | ICD-10-CM | POA: Diagnosis not present

## 2023-01-07 DIAGNOSIS — E785 Hyperlipidemia, unspecified: Secondary | ICD-10-CM | POA: Diagnosis not present

## 2023-01-07 DIAGNOSIS — I519 Heart disease, unspecified: Secondary | ICD-10-CM | POA: Diagnosis not present

## 2023-01-07 DIAGNOSIS — R0602 Shortness of breath: Secondary | ICD-10-CM | POA: Diagnosis not present

## 2023-01-07 DIAGNOSIS — C3490 Malignant neoplasm of unspecified part of unspecified bronchus or lung: Secondary | ICD-10-CM | POA: Diagnosis not present

## 2023-01-07 DIAGNOSIS — R42 Dizziness and giddiness: Secondary | ICD-10-CM | POA: Diagnosis not present

## 2023-01-07 DIAGNOSIS — R531 Weakness: Secondary | ICD-10-CM | POA: Diagnosis not present

## 2023-01-07 DIAGNOSIS — R630 Anorexia: Secondary | ICD-10-CM | POA: Diagnosis not present

## 2023-01-07 DIAGNOSIS — I639 Cerebral infarction, unspecified: Secondary | ICD-10-CM | POA: Diagnosis not present

## 2023-01-07 DIAGNOSIS — R112 Nausea with vomiting, unspecified: Secondary | ICD-10-CM | POA: Diagnosis not present

## 2023-01-07 DIAGNOSIS — F32A Depression, unspecified: Secondary | ICD-10-CM | POA: Diagnosis not present

## 2023-01-07 DIAGNOSIS — K219 Gastro-esophageal reflux disease without esophagitis: Secondary | ICD-10-CM | POA: Diagnosis not present

## 2023-01-07 DIAGNOSIS — Z8546 Personal history of malignant neoplasm of prostate: Secondary | ICD-10-CM | POA: Diagnosis not present

## 2023-01-07 DIAGNOSIS — I480 Paroxysmal atrial fibrillation: Secondary | ICD-10-CM | POA: Diagnosis not present

## 2023-01-07 DIAGNOSIS — R52 Pain, unspecified: Secondary | ICD-10-CM | POA: Diagnosis not present

## 2023-01-07 DIAGNOSIS — I10 Essential (primary) hypertension: Secondary | ICD-10-CM | POA: Diagnosis not present

## 2023-01-07 DIAGNOSIS — F419 Anxiety disorder, unspecified: Secondary | ICD-10-CM | POA: Diagnosis not present

## 2023-01-08 DIAGNOSIS — R42 Dizziness and giddiness: Secondary | ICD-10-CM | POA: Diagnosis not present

## 2023-01-08 DIAGNOSIS — C3490 Malignant neoplasm of unspecified part of unspecified bronchus or lung: Secondary | ICD-10-CM | POA: Diagnosis not present

## 2023-01-08 DIAGNOSIS — R0602 Shortness of breath: Secondary | ICD-10-CM | POA: Diagnosis not present

## 2023-01-08 DIAGNOSIS — F419 Anxiety disorder, unspecified: Secondary | ICD-10-CM | POA: Diagnosis not present

## 2023-01-08 DIAGNOSIS — F32A Depression, unspecified: Secondary | ICD-10-CM | POA: Diagnosis not present

## 2023-01-08 DIAGNOSIS — E785 Hyperlipidemia, unspecified: Secondary | ICD-10-CM | POA: Diagnosis not present

## 2023-01-08 DIAGNOSIS — R531 Weakness: Secondary | ICD-10-CM | POA: Diagnosis not present

## 2023-01-08 DIAGNOSIS — Z8546 Personal history of malignant neoplasm of prostate: Secondary | ICD-10-CM | POA: Diagnosis not present

## 2023-01-08 DIAGNOSIS — R52 Pain, unspecified: Secondary | ICD-10-CM | POA: Diagnosis not present

## 2023-01-08 DIAGNOSIS — I480 Paroxysmal atrial fibrillation: Secondary | ICD-10-CM | POA: Diagnosis not present

## 2023-01-08 DIAGNOSIS — R112 Nausea with vomiting, unspecified: Secondary | ICD-10-CM | POA: Diagnosis not present

## 2023-01-08 DIAGNOSIS — I519 Heart disease, unspecified: Secondary | ICD-10-CM | POA: Diagnosis not present

## 2023-01-08 DIAGNOSIS — R630 Anorexia: Secondary | ICD-10-CM | POA: Diagnosis not present

## 2023-01-08 DIAGNOSIS — I10 Essential (primary) hypertension: Secondary | ICD-10-CM | POA: Diagnosis not present

## 2023-01-08 DIAGNOSIS — K219 Gastro-esophageal reflux disease without esophagitis: Secondary | ICD-10-CM | POA: Diagnosis not present

## 2023-01-08 DIAGNOSIS — I639 Cerebral infarction, unspecified: Secondary | ICD-10-CM | POA: Diagnosis not present

## 2023-01-09 DIAGNOSIS — E785 Hyperlipidemia, unspecified: Secondary | ICD-10-CM | POA: Diagnosis not present

## 2023-01-09 DIAGNOSIS — I639 Cerebral infarction, unspecified: Secondary | ICD-10-CM | POA: Diagnosis not present

## 2023-01-09 DIAGNOSIS — F419 Anxiety disorder, unspecified: Secondary | ICD-10-CM | POA: Diagnosis not present

## 2023-01-09 DIAGNOSIS — Z8546 Personal history of malignant neoplasm of prostate: Secondary | ICD-10-CM | POA: Diagnosis not present

## 2023-01-09 DIAGNOSIS — R52 Pain, unspecified: Secondary | ICD-10-CM | POA: Diagnosis not present

## 2023-01-09 DIAGNOSIS — R0602 Shortness of breath: Secondary | ICD-10-CM | POA: Diagnosis not present

## 2023-01-09 DIAGNOSIS — F32A Depression, unspecified: Secondary | ICD-10-CM | POA: Diagnosis not present

## 2023-01-09 DIAGNOSIS — R630 Anorexia: Secondary | ICD-10-CM | POA: Diagnosis not present

## 2023-01-09 DIAGNOSIS — R42 Dizziness and giddiness: Secondary | ICD-10-CM | POA: Diagnosis not present

## 2023-01-09 DIAGNOSIS — I480 Paroxysmal atrial fibrillation: Secondary | ICD-10-CM | POA: Diagnosis not present

## 2023-01-09 DIAGNOSIS — I10 Essential (primary) hypertension: Secondary | ICD-10-CM | POA: Diagnosis not present

## 2023-01-09 DIAGNOSIS — K219 Gastro-esophageal reflux disease without esophagitis: Secondary | ICD-10-CM | POA: Diagnosis not present

## 2023-01-09 DIAGNOSIS — R112 Nausea with vomiting, unspecified: Secondary | ICD-10-CM | POA: Diagnosis not present

## 2023-01-09 DIAGNOSIS — R531 Weakness: Secondary | ICD-10-CM | POA: Diagnosis not present

## 2023-01-09 DIAGNOSIS — C3490 Malignant neoplasm of unspecified part of unspecified bronchus or lung: Secondary | ICD-10-CM | POA: Diagnosis not present

## 2023-01-09 DIAGNOSIS — I519 Heart disease, unspecified: Secondary | ICD-10-CM | POA: Diagnosis not present

## 2023-01-10 DIAGNOSIS — R42 Dizziness and giddiness: Secondary | ICD-10-CM | POA: Diagnosis not present

## 2023-01-10 DIAGNOSIS — I519 Heart disease, unspecified: Secondary | ICD-10-CM | POA: Diagnosis not present

## 2023-01-10 DIAGNOSIS — R630 Anorexia: Secondary | ICD-10-CM | POA: Diagnosis not present

## 2023-01-10 DIAGNOSIS — R531 Weakness: Secondary | ICD-10-CM | POA: Diagnosis not present

## 2023-01-10 DIAGNOSIS — I480 Paroxysmal atrial fibrillation: Secondary | ICD-10-CM | POA: Diagnosis not present

## 2023-01-10 DIAGNOSIS — I10 Essential (primary) hypertension: Secondary | ICD-10-CM | POA: Diagnosis not present

## 2023-01-10 DIAGNOSIS — R52 Pain, unspecified: Secondary | ICD-10-CM | POA: Diagnosis not present

## 2023-01-10 DIAGNOSIS — R0602 Shortness of breath: Secondary | ICD-10-CM | POA: Diagnosis not present

## 2023-01-10 DIAGNOSIS — R112 Nausea with vomiting, unspecified: Secondary | ICD-10-CM | POA: Diagnosis not present

## 2023-01-10 DIAGNOSIS — Z8546 Personal history of malignant neoplasm of prostate: Secondary | ICD-10-CM | POA: Diagnosis not present

## 2023-01-10 DIAGNOSIS — E785 Hyperlipidemia, unspecified: Secondary | ICD-10-CM | POA: Diagnosis not present

## 2023-01-10 DIAGNOSIS — C3490 Malignant neoplasm of unspecified part of unspecified bronchus or lung: Secondary | ICD-10-CM | POA: Diagnosis not present

## 2023-01-10 DIAGNOSIS — F32A Depression, unspecified: Secondary | ICD-10-CM | POA: Diagnosis not present

## 2023-01-10 DIAGNOSIS — K219 Gastro-esophageal reflux disease without esophagitis: Secondary | ICD-10-CM | POA: Diagnosis not present

## 2023-01-10 DIAGNOSIS — F419 Anxiety disorder, unspecified: Secondary | ICD-10-CM | POA: Diagnosis not present

## 2023-01-10 DIAGNOSIS — I639 Cerebral infarction, unspecified: Secondary | ICD-10-CM | POA: Diagnosis not present

## 2023-01-11 DIAGNOSIS — I10 Essential (primary) hypertension: Secondary | ICD-10-CM | POA: Diagnosis not present

## 2023-01-11 DIAGNOSIS — Z8546 Personal history of malignant neoplasm of prostate: Secondary | ICD-10-CM | POA: Diagnosis not present

## 2023-01-11 DIAGNOSIS — R112 Nausea with vomiting, unspecified: Secondary | ICD-10-CM | POA: Diagnosis not present

## 2023-01-11 DIAGNOSIS — R630 Anorexia: Secondary | ICD-10-CM | POA: Diagnosis not present

## 2023-01-11 DIAGNOSIS — F419 Anxiety disorder, unspecified: Secondary | ICD-10-CM | POA: Diagnosis not present

## 2023-01-11 DIAGNOSIS — I519 Heart disease, unspecified: Secondary | ICD-10-CM | POA: Diagnosis not present

## 2023-01-11 DIAGNOSIS — E785 Hyperlipidemia, unspecified: Secondary | ICD-10-CM | POA: Diagnosis not present

## 2023-01-11 DIAGNOSIS — R42 Dizziness and giddiness: Secondary | ICD-10-CM | POA: Diagnosis not present

## 2023-01-11 DIAGNOSIS — F32A Depression, unspecified: Secondary | ICD-10-CM | POA: Diagnosis not present

## 2023-01-11 DIAGNOSIS — I480 Paroxysmal atrial fibrillation: Secondary | ICD-10-CM | POA: Diagnosis not present

## 2023-01-11 DIAGNOSIS — K219 Gastro-esophageal reflux disease without esophagitis: Secondary | ICD-10-CM | POA: Diagnosis not present

## 2023-01-11 DIAGNOSIS — R52 Pain, unspecified: Secondary | ICD-10-CM | POA: Diagnosis not present

## 2023-01-11 DIAGNOSIS — R531 Weakness: Secondary | ICD-10-CM | POA: Diagnosis not present

## 2023-01-11 DIAGNOSIS — I639 Cerebral infarction, unspecified: Secondary | ICD-10-CM | POA: Diagnosis not present

## 2023-01-11 DIAGNOSIS — R0602 Shortness of breath: Secondary | ICD-10-CM | POA: Diagnosis not present

## 2023-01-11 DIAGNOSIS — C3490 Malignant neoplasm of unspecified part of unspecified bronchus or lung: Secondary | ICD-10-CM | POA: Diagnosis not present

## 2023-01-12 DIAGNOSIS — K219 Gastro-esophageal reflux disease without esophagitis: Secondary | ICD-10-CM | POA: Diagnosis not present

## 2023-01-12 DIAGNOSIS — I10 Essential (primary) hypertension: Secondary | ICD-10-CM | POA: Diagnosis not present

## 2023-01-12 DIAGNOSIS — I480 Paroxysmal atrial fibrillation: Secondary | ICD-10-CM | POA: Diagnosis not present

## 2023-01-12 DIAGNOSIS — R42 Dizziness and giddiness: Secondary | ICD-10-CM | POA: Diagnosis not present

## 2023-01-12 DIAGNOSIS — Z8546 Personal history of malignant neoplasm of prostate: Secondary | ICD-10-CM | POA: Diagnosis not present

## 2023-01-12 DIAGNOSIS — F419 Anxiety disorder, unspecified: Secondary | ICD-10-CM | POA: Diagnosis not present

## 2023-01-12 DIAGNOSIS — C3490 Malignant neoplasm of unspecified part of unspecified bronchus or lung: Secondary | ICD-10-CM | POA: Diagnosis not present

## 2023-01-12 DIAGNOSIS — E785 Hyperlipidemia, unspecified: Secondary | ICD-10-CM | POA: Diagnosis not present

## 2023-01-12 DIAGNOSIS — I519 Heart disease, unspecified: Secondary | ICD-10-CM | POA: Diagnosis not present

## 2023-01-12 DIAGNOSIS — R112 Nausea with vomiting, unspecified: Secondary | ICD-10-CM | POA: Diagnosis not present

## 2023-01-12 DIAGNOSIS — R630 Anorexia: Secondary | ICD-10-CM | POA: Diagnosis not present

## 2023-01-12 DIAGNOSIS — R0602 Shortness of breath: Secondary | ICD-10-CM | POA: Diagnosis not present

## 2023-01-12 DIAGNOSIS — R531 Weakness: Secondary | ICD-10-CM | POA: Diagnosis not present

## 2023-01-12 DIAGNOSIS — R52 Pain, unspecified: Secondary | ICD-10-CM | POA: Diagnosis not present

## 2023-01-12 DIAGNOSIS — F32A Depression, unspecified: Secondary | ICD-10-CM | POA: Diagnosis not present

## 2023-01-12 DIAGNOSIS — I639 Cerebral infarction, unspecified: Secondary | ICD-10-CM | POA: Diagnosis not present

## 2023-01-13 DIAGNOSIS — R630 Anorexia: Secondary | ICD-10-CM | POA: Diagnosis not present

## 2023-01-13 DIAGNOSIS — R52 Pain, unspecified: Secondary | ICD-10-CM | POA: Diagnosis not present

## 2023-01-13 DIAGNOSIS — R42 Dizziness and giddiness: Secondary | ICD-10-CM | POA: Diagnosis not present

## 2023-01-13 DIAGNOSIS — R0602 Shortness of breath: Secondary | ICD-10-CM | POA: Diagnosis not present

## 2023-01-13 DIAGNOSIS — Z8546 Personal history of malignant neoplasm of prostate: Secondary | ICD-10-CM | POA: Diagnosis not present

## 2023-01-13 DIAGNOSIS — R112 Nausea with vomiting, unspecified: Secondary | ICD-10-CM | POA: Diagnosis not present

## 2023-01-13 DIAGNOSIS — I639 Cerebral infarction, unspecified: Secondary | ICD-10-CM | POA: Diagnosis not present

## 2023-01-13 DIAGNOSIS — I519 Heart disease, unspecified: Secondary | ICD-10-CM | POA: Diagnosis not present

## 2023-01-13 DIAGNOSIS — F419 Anxiety disorder, unspecified: Secondary | ICD-10-CM | POA: Diagnosis not present

## 2023-01-13 DIAGNOSIS — I10 Essential (primary) hypertension: Secondary | ICD-10-CM | POA: Diagnosis not present

## 2023-01-13 DIAGNOSIS — F32A Depression, unspecified: Secondary | ICD-10-CM | POA: Diagnosis not present

## 2023-01-13 DIAGNOSIS — K219 Gastro-esophageal reflux disease without esophagitis: Secondary | ICD-10-CM | POA: Diagnosis not present

## 2023-01-13 DIAGNOSIS — R531 Weakness: Secondary | ICD-10-CM | POA: Diagnosis not present

## 2023-01-13 DIAGNOSIS — E785 Hyperlipidemia, unspecified: Secondary | ICD-10-CM | POA: Diagnosis not present

## 2023-01-13 DIAGNOSIS — I480 Paroxysmal atrial fibrillation: Secondary | ICD-10-CM | POA: Diagnosis not present

## 2023-01-13 DIAGNOSIS — C3490 Malignant neoplasm of unspecified part of unspecified bronchus or lung: Secondary | ICD-10-CM | POA: Diagnosis not present

## 2023-01-14 DIAGNOSIS — K219 Gastro-esophageal reflux disease without esophagitis: Secondary | ICD-10-CM | POA: Diagnosis not present

## 2023-01-14 DIAGNOSIS — R112 Nausea with vomiting, unspecified: Secondary | ICD-10-CM | POA: Diagnosis not present

## 2023-01-14 DIAGNOSIS — C3490 Malignant neoplasm of unspecified part of unspecified bronchus or lung: Secondary | ICD-10-CM | POA: Diagnosis not present

## 2023-01-14 DIAGNOSIS — I519 Heart disease, unspecified: Secondary | ICD-10-CM | POA: Diagnosis not present

## 2023-01-14 DIAGNOSIS — Z8546 Personal history of malignant neoplasm of prostate: Secondary | ICD-10-CM | POA: Diagnosis not present

## 2023-01-14 DIAGNOSIS — I10 Essential (primary) hypertension: Secondary | ICD-10-CM | POA: Diagnosis not present

## 2023-01-14 DIAGNOSIS — R0602 Shortness of breath: Secondary | ICD-10-CM | POA: Diagnosis not present

## 2023-01-14 DIAGNOSIS — I639 Cerebral infarction, unspecified: Secondary | ICD-10-CM | POA: Diagnosis not present

## 2023-01-14 DIAGNOSIS — E785 Hyperlipidemia, unspecified: Secondary | ICD-10-CM | POA: Diagnosis not present

## 2023-01-14 DIAGNOSIS — F419 Anxiety disorder, unspecified: Secondary | ICD-10-CM | POA: Diagnosis not present

## 2023-01-14 DIAGNOSIS — F32A Depression, unspecified: Secondary | ICD-10-CM | POA: Diagnosis not present

## 2023-01-14 DIAGNOSIS — I480 Paroxysmal atrial fibrillation: Secondary | ICD-10-CM | POA: Diagnosis not present

## 2023-01-14 DIAGNOSIS — R630 Anorexia: Secondary | ICD-10-CM | POA: Diagnosis not present

## 2023-01-14 DIAGNOSIS — R52 Pain, unspecified: Secondary | ICD-10-CM | POA: Diagnosis not present

## 2023-01-14 DIAGNOSIS — R42 Dizziness and giddiness: Secondary | ICD-10-CM | POA: Diagnosis not present

## 2023-01-14 DIAGNOSIS — R531 Weakness: Secondary | ICD-10-CM | POA: Diagnosis not present

## 2023-01-15 DIAGNOSIS — Z8546 Personal history of malignant neoplasm of prostate: Secondary | ICD-10-CM | POA: Diagnosis not present

## 2023-01-15 DIAGNOSIS — C3490 Malignant neoplasm of unspecified part of unspecified bronchus or lung: Secondary | ICD-10-CM | POA: Diagnosis not present

## 2023-01-15 DIAGNOSIS — R531 Weakness: Secondary | ICD-10-CM | POA: Diagnosis not present

## 2023-01-15 DIAGNOSIS — R52 Pain, unspecified: Secondary | ICD-10-CM | POA: Diagnosis not present

## 2023-01-15 DIAGNOSIS — R42 Dizziness and giddiness: Secondary | ICD-10-CM | POA: Diagnosis not present

## 2023-01-15 DIAGNOSIS — I10 Essential (primary) hypertension: Secondary | ICD-10-CM | POA: Diagnosis not present

## 2023-01-15 DIAGNOSIS — F419 Anxiety disorder, unspecified: Secondary | ICD-10-CM | POA: Diagnosis not present

## 2023-01-15 DIAGNOSIS — R630 Anorexia: Secondary | ICD-10-CM | POA: Diagnosis not present

## 2023-01-15 DIAGNOSIS — R0602 Shortness of breath: Secondary | ICD-10-CM | POA: Diagnosis not present

## 2023-01-15 DIAGNOSIS — R112 Nausea with vomiting, unspecified: Secondary | ICD-10-CM | POA: Diagnosis not present

## 2023-01-15 DIAGNOSIS — F32A Depression, unspecified: Secondary | ICD-10-CM | POA: Diagnosis not present

## 2023-01-15 DIAGNOSIS — I519 Heart disease, unspecified: Secondary | ICD-10-CM | POA: Diagnosis not present

## 2023-01-15 DIAGNOSIS — I639 Cerebral infarction, unspecified: Secondary | ICD-10-CM | POA: Diagnosis not present

## 2023-01-15 DIAGNOSIS — K219 Gastro-esophageal reflux disease without esophagitis: Secondary | ICD-10-CM | POA: Diagnosis not present

## 2023-01-15 DIAGNOSIS — I480 Paroxysmal atrial fibrillation: Secondary | ICD-10-CM | POA: Diagnosis not present

## 2023-01-15 DIAGNOSIS — E785 Hyperlipidemia, unspecified: Secondary | ICD-10-CM | POA: Diagnosis not present

## 2023-01-16 ENCOUNTER — Other Ambulatory Visit: Payer: Self-pay | Admitting: Physician Assistant

## 2023-01-16 DIAGNOSIS — I519 Heart disease, unspecified: Secondary | ICD-10-CM | POA: Diagnosis not present

## 2023-01-16 DIAGNOSIS — R52 Pain, unspecified: Secondary | ICD-10-CM | POA: Diagnosis not present

## 2023-01-16 DIAGNOSIS — I480 Paroxysmal atrial fibrillation: Secondary | ICD-10-CM | POA: Diagnosis not present

## 2023-01-16 DIAGNOSIS — R531 Weakness: Secondary | ICD-10-CM | POA: Diagnosis not present

## 2023-01-16 DIAGNOSIS — R0602 Shortness of breath: Secondary | ICD-10-CM | POA: Diagnosis not present

## 2023-01-16 DIAGNOSIS — I639 Cerebral infarction, unspecified: Secondary | ICD-10-CM | POA: Diagnosis not present

## 2023-01-16 DIAGNOSIS — R112 Nausea with vomiting, unspecified: Secondary | ICD-10-CM | POA: Diagnosis not present

## 2023-01-16 DIAGNOSIS — F419 Anxiety disorder, unspecified: Secondary | ICD-10-CM | POA: Diagnosis not present

## 2023-01-16 DIAGNOSIS — C3490 Malignant neoplasm of unspecified part of unspecified bronchus or lung: Secondary | ICD-10-CM | POA: Diagnosis not present

## 2023-01-16 DIAGNOSIS — R42 Dizziness and giddiness: Secondary | ICD-10-CM | POA: Diagnosis not present

## 2023-01-16 DIAGNOSIS — I10 Essential (primary) hypertension: Secondary | ICD-10-CM | POA: Diagnosis not present

## 2023-01-16 DIAGNOSIS — Z8546 Personal history of malignant neoplasm of prostate: Secondary | ICD-10-CM | POA: Diagnosis not present

## 2023-01-16 DIAGNOSIS — R630 Anorexia: Secondary | ICD-10-CM | POA: Diagnosis not present

## 2023-01-16 DIAGNOSIS — F32A Depression, unspecified: Secondary | ICD-10-CM | POA: Diagnosis not present

## 2023-01-16 DIAGNOSIS — K219 Gastro-esophageal reflux disease without esophagitis: Secondary | ICD-10-CM | POA: Diagnosis not present

## 2023-01-16 DIAGNOSIS — E785 Hyperlipidemia, unspecified: Secondary | ICD-10-CM | POA: Diagnosis not present

## 2023-01-17 DIAGNOSIS — R112 Nausea with vomiting, unspecified: Secondary | ICD-10-CM | POA: Diagnosis not present

## 2023-01-17 DIAGNOSIS — R42 Dizziness and giddiness: Secondary | ICD-10-CM | POA: Diagnosis not present

## 2023-01-17 DIAGNOSIS — F32A Depression, unspecified: Secondary | ICD-10-CM | POA: Diagnosis not present

## 2023-01-17 DIAGNOSIS — I480 Paroxysmal atrial fibrillation: Secondary | ICD-10-CM | POA: Diagnosis not present

## 2023-01-17 DIAGNOSIS — I639 Cerebral infarction, unspecified: Secondary | ICD-10-CM | POA: Diagnosis not present

## 2023-01-17 DIAGNOSIS — I10 Essential (primary) hypertension: Secondary | ICD-10-CM | POA: Diagnosis not present

## 2023-01-17 DIAGNOSIS — F419 Anxiety disorder, unspecified: Secondary | ICD-10-CM | POA: Diagnosis not present

## 2023-01-17 DIAGNOSIS — I519 Heart disease, unspecified: Secondary | ICD-10-CM | POA: Diagnosis not present

## 2023-01-17 DIAGNOSIS — R52 Pain, unspecified: Secondary | ICD-10-CM | POA: Diagnosis not present

## 2023-01-17 DIAGNOSIS — R531 Weakness: Secondary | ICD-10-CM | POA: Diagnosis not present

## 2023-01-17 DIAGNOSIS — C3490 Malignant neoplasm of unspecified part of unspecified bronchus or lung: Secondary | ICD-10-CM | POA: Diagnosis not present

## 2023-01-17 DIAGNOSIS — K219 Gastro-esophageal reflux disease without esophagitis: Secondary | ICD-10-CM | POA: Diagnosis not present

## 2023-01-17 DIAGNOSIS — R630 Anorexia: Secondary | ICD-10-CM | POA: Diagnosis not present

## 2023-01-17 DIAGNOSIS — E785 Hyperlipidemia, unspecified: Secondary | ICD-10-CM | POA: Diagnosis not present

## 2023-01-17 DIAGNOSIS — R0602 Shortness of breath: Secondary | ICD-10-CM | POA: Diagnosis not present

## 2023-01-17 DIAGNOSIS — Z8546 Personal history of malignant neoplasm of prostate: Secondary | ICD-10-CM | POA: Diagnosis not present

## 2023-01-18 DIAGNOSIS — R531 Weakness: Secondary | ICD-10-CM | POA: Diagnosis not present

## 2023-01-18 DIAGNOSIS — R0602 Shortness of breath: Secondary | ICD-10-CM | POA: Diagnosis not present

## 2023-01-18 DIAGNOSIS — F32A Depression, unspecified: Secondary | ICD-10-CM | POA: Diagnosis not present

## 2023-01-18 DIAGNOSIS — E785 Hyperlipidemia, unspecified: Secondary | ICD-10-CM | POA: Diagnosis not present

## 2023-01-18 DIAGNOSIS — F419 Anxiety disorder, unspecified: Secondary | ICD-10-CM | POA: Diagnosis not present

## 2023-01-18 DIAGNOSIS — I639 Cerebral infarction, unspecified: Secondary | ICD-10-CM | POA: Diagnosis not present

## 2023-01-18 DIAGNOSIS — C3490 Malignant neoplasm of unspecified part of unspecified bronchus or lung: Secondary | ICD-10-CM | POA: Diagnosis not present

## 2023-01-18 DIAGNOSIS — K219 Gastro-esophageal reflux disease without esophagitis: Secondary | ICD-10-CM | POA: Diagnosis not present

## 2023-01-18 DIAGNOSIS — Z8546 Personal history of malignant neoplasm of prostate: Secondary | ICD-10-CM | POA: Diagnosis not present

## 2023-01-18 DIAGNOSIS — I480 Paroxysmal atrial fibrillation: Secondary | ICD-10-CM | POA: Diagnosis not present

## 2023-01-18 DIAGNOSIS — I519 Heart disease, unspecified: Secondary | ICD-10-CM | POA: Diagnosis not present

## 2023-01-18 DIAGNOSIS — R630 Anorexia: Secondary | ICD-10-CM | POA: Diagnosis not present

## 2023-01-18 DIAGNOSIS — I10 Essential (primary) hypertension: Secondary | ICD-10-CM | POA: Diagnosis not present

## 2023-01-18 DIAGNOSIS — R42 Dizziness and giddiness: Secondary | ICD-10-CM | POA: Diagnosis not present

## 2023-01-18 DIAGNOSIS — R52 Pain, unspecified: Secondary | ICD-10-CM | POA: Diagnosis not present

## 2023-01-18 DIAGNOSIS — R112 Nausea with vomiting, unspecified: Secondary | ICD-10-CM | POA: Diagnosis not present

## 2023-01-19 DIAGNOSIS — R0602 Shortness of breath: Secondary | ICD-10-CM | POA: Diagnosis not present

## 2023-01-19 DIAGNOSIS — I639 Cerebral infarction, unspecified: Secondary | ICD-10-CM | POA: Diagnosis not present

## 2023-01-19 DIAGNOSIS — I480 Paroxysmal atrial fibrillation: Secondary | ICD-10-CM | POA: Diagnosis not present

## 2023-01-19 DIAGNOSIS — R42 Dizziness and giddiness: Secondary | ICD-10-CM | POA: Diagnosis not present

## 2023-01-19 DIAGNOSIS — R52 Pain, unspecified: Secondary | ICD-10-CM | POA: Diagnosis not present

## 2023-01-19 DIAGNOSIS — E785 Hyperlipidemia, unspecified: Secondary | ICD-10-CM | POA: Diagnosis not present

## 2023-01-19 DIAGNOSIS — R630 Anorexia: Secondary | ICD-10-CM | POA: Diagnosis not present

## 2023-01-19 DIAGNOSIS — I10 Essential (primary) hypertension: Secondary | ICD-10-CM | POA: Diagnosis not present

## 2023-01-19 DIAGNOSIS — I519 Heart disease, unspecified: Secondary | ICD-10-CM | POA: Diagnosis not present

## 2023-01-19 DIAGNOSIS — C3490 Malignant neoplasm of unspecified part of unspecified bronchus or lung: Secondary | ICD-10-CM | POA: Diagnosis not present

## 2023-01-19 DIAGNOSIS — K219 Gastro-esophageal reflux disease without esophagitis: Secondary | ICD-10-CM | POA: Diagnosis not present

## 2023-01-19 DIAGNOSIS — R531 Weakness: Secondary | ICD-10-CM | POA: Diagnosis not present

## 2023-01-19 DIAGNOSIS — F32A Depression, unspecified: Secondary | ICD-10-CM | POA: Diagnosis not present

## 2023-01-19 DIAGNOSIS — Z8546 Personal history of malignant neoplasm of prostate: Secondary | ICD-10-CM | POA: Diagnosis not present

## 2023-01-19 DIAGNOSIS — F419 Anxiety disorder, unspecified: Secondary | ICD-10-CM | POA: Diagnosis not present

## 2023-01-19 DIAGNOSIS — R112 Nausea with vomiting, unspecified: Secondary | ICD-10-CM | POA: Diagnosis not present

## 2023-01-20 DIAGNOSIS — R630 Anorexia: Secondary | ICD-10-CM | POA: Diagnosis not present

## 2023-01-20 DIAGNOSIS — E785 Hyperlipidemia, unspecified: Secondary | ICD-10-CM | POA: Diagnosis not present

## 2023-01-20 DIAGNOSIS — F32A Depression, unspecified: Secondary | ICD-10-CM | POA: Diagnosis not present

## 2023-01-20 DIAGNOSIS — C3490 Malignant neoplasm of unspecified part of unspecified bronchus or lung: Secondary | ICD-10-CM | POA: Diagnosis not present

## 2023-01-20 DIAGNOSIS — K219 Gastro-esophageal reflux disease without esophagitis: Secondary | ICD-10-CM | POA: Diagnosis not present

## 2023-01-20 DIAGNOSIS — Z8546 Personal history of malignant neoplasm of prostate: Secondary | ICD-10-CM | POA: Diagnosis not present

## 2023-01-20 DIAGNOSIS — I480 Paroxysmal atrial fibrillation: Secondary | ICD-10-CM | POA: Diagnosis not present

## 2023-01-20 DIAGNOSIS — I10 Essential (primary) hypertension: Secondary | ICD-10-CM | POA: Diagnosis not present

## 2023-01-20 DIAGNOSIS — R52 Pain, unspecified: Secondary | ICD-10-CM | POA: Diagnosis not present

## 2023-01-20 DIAGNOSIS — F419 Anxiety disorder, unspecified: Secondary | ICD-10-CM | POA: Diagnosis not present

## 2023-01-20 DIAGNOSIS — R0602 Shortness of breath: Secondary | ICD-10-CM | POA: Diagnosis not present

## 2023-01-20 DIAGNOSIS — R531 Weakness: Secondary | ICD-10-CM | POA: Diagnosis not present

## 2023-01-20 DIAGNOSIS — R42 Dizziness and giddiness: Secondary | ICD-10-CM | POA: Diagnosis not present

## 2023-01-20 DIAGNOSIS — I639 Cerebral infarction, unspecified: Secondary | ICD-10-CM | POA: Diagnosis not present

## 2023-01-20 DIAGNOSIS — R112 Nausea with vomiting, unspecified: Secondary | ICD-10-CM | POA: Diagnosis not present

## 2023-01-20 DIAGNOSIS — I519 Heart disease, unspecified: Secondary | ICD-10-CM | POA: Diagnosis not present

## 2023-01-21 DIAGNOSIS — E785 Hyperlipidemia, unspecified: Secondary | ICD-10-CM | POA: Diagnosis not present

## 2023-01-21 DIAGNOSIS — I480 Paroxysmal atrial fibrillation: Secondary | ICD-10-CM | POA: Diagnosis not present

## 2023-01-21 DIAGNOSIS — I519 Heart disease, unspecified: Secondary | ICD-10-CM | POA: Diagnosis not present

## 2023-01-21 DIAGNOSIS — F32A Depression, unspecified: Secondary | ICD-10-CM | POA: Diagnosis not present

## 2023-01-21 DIAGNOSIS — F419 Anxiety disorder, unspecified: Secondary | ICD-10-CM | POA: Diagnosis not present

## 2023-01-21 DIAGNOSIS — C3490 Malignant neoplasm of unspecified part of unspecified bronchus or lung: Secondary | ICD-10-CM | POA: Diagnosis not present

## 2023-01-21 DIAGNOSIS — R52 Pain, unspecified: Secondary | ICD-10-CM | POA: Diagnosis not present

## 2023-01-21 DIAGNOSIS — Z8546 Personal history of malignant neoplasm of prostate: Secondary | ICD-10-CM | POA: Diagnosis not present

## 2023-01-21 DIAGNOSIS — R0602 Shortness of breath: Secondary | ICD-10-CM | POA: Diagnosis not present

## 2023-01-21 DIAGNOSIS — I10 Essential (primary) hypertension: Secondary | ICD-10-CM | POA: Diagnosis not present

## 2023-01-21 DIAGNOSIS — R112 Nausea with vomiting, unspecified: Secondary | ICD-10-CM | POA: Diagnosis not present

## 2023-01-21 DIAGNOSIS — R630 Anorexia: Secondary | ICD-10-CM | POA: Diagnosis not present

## 2023-01-21 DIAGNOSIS — R531 Weakness: Secondary | ICD-10-CM | POA: Diagnosis not present

## 2023-01-21 DIAGNOSIS — K219 Gastro-esophageal reflux disease without esophagitis: Secondary | ICD-10-CM | POA: Diagnosis not present

## 2023-01-21 DIAGNOSIS — I639 Cerebral infarction, unspecified: Secondary | ICD-10-CM | POA: Diagnosis not present

## 2023-01-21 DIAGNOSIS — R42 Dizziness and giddiness: Secondary | ICD-10-CM | POA: Diagnosis not present

## 2023-01-22 DIAGNOSIS — I480 Paroxysmal atrial fibrillation: Secondary | ICD-10-CM | POA: Diagnosis not present

## 2023-01-22 DIAGNOSIS — R42 Dizziness and giddiness: Secondary | ICD-10-CM | POA: Diagnosis not present

## 2023-01-22 DIAGNOSIS — F419 Anxiety disorder, unspecified: Secondary | ICD-10-CM | POA: Diagnosis not present

## 2023-01-22 DIAGNOSIS — R112 Nausea with vomiting, unspecified: Secondary | ICD-10-CM | POA: Diagnosis not present

## 2023-01-22 DIAGNOSIS — R630 Anorexia: Secondary | ICD-10-CM | POA: Diagnosis not present

## 2023-01-22 DIAGNOSIS — F32A Depression, unspecified: Secondary | ICD-10-CM | POA: Diagnosis not present

## 2023-01-22 DIAGNOSIS — I639 Cerebral infarction, unspecified: Secondary | ICD-10-CM | POA: Diagnosis not present

## 2023-01-22 DIAGNOSIS — C3490 Malignant neoplasm of unspecified part of unspecified bronchus or lung: Secondary | ICD-10-CM | POA: Diagnosis not present

## 2023-01-22 DIAGNOSIS — I519 Heart disease, unspecified: Secondary | ICD-10-CM | POA: Diagnosis not present

## 2023-01-22 DIAGNOSIS — I10 Essential (primary) hypertension: Secondary | ICD-10-CM | POA: Diagnosis not present

## 2023-01-22 DIAGNOSIS — K219 Gastro-esophageal reflux disease without esophagitis: Secondary | ICD-10-CM | POA: Diagnosis not present

## 2023-01-22 DIAGNOSIS — R0602 Shortness of breath: Secondary | ICD-10-CM | POA: Diagnosis not present

## 2023-01-22 DIAGNOSIS — E785 Hyperlipidemia, unspecified: Secondary | ICD-10-CM | POA: Diagnosis not present

## 2023-01-22 DIAGNOSIS — R52 Pain, unspecified: Secondary | ICD-10-CM | POA: Diagnosis not present

## 2023-01-22 DIAGNOSIS — Z8546 Personal history of malignant neoplasm of prostate: Secondary | ICD-10-CM | POA: Diagnosis not present

## 2023-01-22 DIAGNOSIS — R531 Weakness: Secondary | ICD-10-CM | POA: Diagnosis not present

## 2023-01-23 DIAGNOSIS — R42 Dizziness and giddiness: Secondary | ICD-10-CM | POA: Diagnosis not present

## 2023-01-23 DIAGNOSIS — R531 Weakness: Secondary | ICD-10-CM | POA: Diagnosis not present

## 2023-01-23 DIAGNOSIS — F419 Anxiety disorder, unspecified: Secondary | ICD-10-CM | POA: Diagnosis not present

## 2023-01-23 DIAGNOSIS — I480 Paroxysmal atrial fibrillation: Secondary | ICD-10-CM | POA: Diagnosis not present

## 2023-01-23 DIAGNOSIS — R52 Pain, unspecified: Secondary | ICD-10-CM | POA: Diagnosis not present

## 2023-01-23 DIAGNOSIS — F32A Depression, unspecified: Secondary | ICD-10-CM | POA: Diagnosis not present

## 2023-01-23 DIAGNOSIS — I639 Cerebral infarction, unspecified: Secondary | ICD-10-CM | POA: Diagnosis not present

## 2023-01-23 DIAGNOSIS — R0602 Shortness of breath: Secondary | ICD-10-CM | POA: Diagnosis not present

## 2023-01-23 DIAGNOSIS — R630 Anorexia: Secondary | ICD-10-CM | POA: Diagnosis not present

## 2023-01-23 DIAGNOSIS — Z8546 Personal history of malignant neoplasm of prostate: Secondary | ICD-10-CM | POA: Diagnosis not present

## 2023-01-23 DIAGNOSIS — E785 Hyperlipidemia, unspecified: Secondary | ICD-10-CM | POA: Diagnosis not present

## 2023-01-23 DIAGNOSIS — C3490 Malignant neoplasm of unspecified part of unspecified bronchus or lung: Secondary | ICD-10-CM | POA: Diagnosis not present

## 2023-01-23 DIAGNOSIS — I10 Essential (primary) hypertension: Secondary | ICD-10-CM | POA: Diagnosis not present

## 2023-01-23 DIAGNOSIS — I519 Heart disease, unspecified: Secondary | ICD-10-CM | POA: Diagnosis not present

## 2023-01-23 DIAGNOSIS — K219 Gastro-esophageal reflux disease without esophagitis: Secondary | ICD-10-CM | POA: Diagnosis not present

## 2023-01-23 DIAGNOSIS — R112 Nausea with vomiting, unspecified: Secondary | ICD-10-CM | POA: Diagnosis not present

## 2023-01-24 DIAGNOSIS — I10 Essential (primary) hypertension: Secondary | ICD-10-CM | POA: Diagnosis not present

## 2023-01-24 DIAGNOSIS — R0602 Shortness of breath: Secondary | ICD-10-CM | POA: Diagnosis not present

## 2023-01-24 DIAGNOSIS — R42 Dizziness and giddiness: Secondary | ICD-10-CM | POA: Diagnosis not present

## 2023-01-24 DIAGNOSIS — C3490 Malignant neoplasm of unspecified part of unspecified bronchus or lung: Secondary | ICD-10-CM | POA: Diagnosis not present

## 2023-01-24 DIAGNOSIS — K219 Gastro-esophageal reflux disease without esophagitis: Secondary | ICD-10-CM | POA: Diagnosis not present

## 2023-01-24 DIAGNOSIS — F32A Depression, unspecified: Secondary | ICD-10-CM | POA: Diagnosis not present

## 2023-01-24 DIAGNOSIS — F419 Anxiety disorder, unspecified: Secondary | ICD-10-CM | POA: Diagnosis not present

## 2023-01-24 DIAGNOSIS — I519 Heart disease, unspecified: Secondary | ICD-10-CM | POA: Diagnosis not present

## 2023-01-24 DIAGNOSIS — R52 Pain, unspecified: Secondary | ICD-10-CM | POA: Diagnosis not present

## 2023-01-24 DIAGNOSIS — I480 Paroxysmal atrial fibrillation: Secondary | ICD-10-CM | POA: Diagnosis not present

## 2023-01-24 DIAGNOSIS — I639 Cerebral infarction, unspecified: Secondary | ICD-10-CM | POA: Diagnosis not present

## 2023-01-24 DIAGNOSIS — R630 Anorexia: Secondary | ICD-10-CM | POA: Diagnosis not present

## 2023-01-24 DIAGNOSIS — R531 Weakness: Secondary | ICD-10-CM | POA: Diagnosis not present

## 2023-01-24 DIAGNOSIS — Z8546 Personal history of malignant neoplasm of prostate: Secondary | ICD-10-CM | POA: Diagnosis not present

## 2023-01-24 DIAGNOSIS — R112 Nausea with vomiting, unspecified: Secondary | ICD-10-CM | POA: Diagnosis not present

## 2023-01-24 DIAGNOSIS — E785 Hyperlipidemia, unspecified: Secondary | ICD-10-CM | POA: Diagnosis not present

## 2023-01-25 DIAGNOSIS — R630 Anorexia: Secondary | ICD-10-CM | POA: Diagnosis not present

## 2023-01-25 DIAGNOSIS — R52 Pain, unspecified: Secondary | ICD-10-CM | POA: Diagnosis not present

## 2023-01-25 DIAGNOSIS — I519 Heart disease, unspecified: Secondary | ICD-10-CM | POA: Diagnosis not present

## 2023-01-25 DIAGNOSIS — K219 Gastro-esophageal reflux disease without esophagitis: Secondary | ICD-10-CM | POA: Diagnosis not present

## 2023-01-25 DIAGNOSIS — E785 Hyperlipidemia, unspecified: Secondary | ICD-10-CM | POA: Diagnosis not present

## 2023-01-25 DIAGNOSIS — Z8546 Personal history of malignant neoplasm of prostate: Secondary | ICD-10-CM | POA: Diagnosis not present

## 2023-01-25 DIAGNOSIS — F32A Depression, unspecified: Secondary | ICD-10-CM | POA: Diagnosis not present

## 2023-01-25 DIAGNOSIS — R531 Weakness: Secondary | ICD-10-CM | POA: Diagnosis not present

## 2023-01-25 DIAGNOSIS — I639 Cerebral infarction, unspecified: Secondary | ICD-10-CM | POA: Diagnosis not present

## 2023-01-25 DIAGNOSIS — R42 Dizziness and giddiness: Secondary | ICD-10-CM | POA: Diagnosis not present

## 2023-01-25 DIAGNOSIS — F419 Anxiety disorder, unspecified: Secondary | ICD-10-CM | POA: Diagnosis not present

## 2023-01-25 DIAGNOSIS — C3490 Malignant neoplasm of unspecified part of unspecified bronchus or lung: Secondary | ICD-10-CM | POA: Diagnosis not present

## 2023-01-25 DIAGNOSIS — I480 Paroxysmal atrial fibrillation: Secondary | ICD-10-CM | POA: Diagnosis not present

## 2023-01-25 DIAGNOSIS — R112 Nausea with vomiting, unspecified: Secondary | ICD-10-CM | POA: Diagnosis not present

## 2023-01-25 DIAGNOSIS — I10 Essential (primary) hypertension: Secondary | ICD-10-CM | POA: Diagnosis not present

## 2023-01-25 DIAGNOSIS — R0602 Shortness of breath: Secondary | ICD-10-CM | POA: Diagnosis not present

## 2023-01-26 DIAGNOSIS — I480 Paroxysmal atrial fibrillation: Secondary | ICD-10-CM | POA: Diagnosis not present

## 2023-01-26 DIAGNOSIS — I639 Cerebral infarction, unspecified: Secondary | ICD-10-CM | POA: Diagnosis not present

## 2023-01-26 DIAGNOSIS — F419 Anxiety disorder, unspecified: Secondary | ICD-10-CM | POA: Diagnosis not present

## 2023-01-26 DIAGNOSIS — R112 Nausea with vomiting, unspecified: Secondary | ICD-10-CM | POA: Diagnosis not present

## 2023-01-26 DIAGNOSIS — F32A Depression, unspecified: Secondary | ICD-10-CM | POA: Diagnosis not present

## 2023-01-26 DIAGNOSIS — R42 Dizziness and giddiness: Secondary | ICD-10-CM | POA: Diagnosis not present

## 2023-01-26 DIAGNOSIS — C3490 Malignant neoplasm of unspecified part of unspecified bronchus or lung: Secondary | ICD-10-CM | POA: Diagnosis not present

## 2023-01-26 DIAGNOSIS — R0602 Shortness of breath: Secondary | ICD-10-CM | POA: Diagnosis not present

## 2023-01-26 DIAGNOSIS — R531 Weakness: Secondary | ICD-10-CM | POA: Diagnosis not present

## 2023-01-26 DIAGNOSIS — E785 Hyperlipidemia, unspecified: Secondary | ICD-10-CM | POA: Diagnosis not present

## 2023-01-26 DIAGNOSIS — Z8546 Personal history of malignant neoplasm of prostate: Secondary | ICD-10-CM | POA: Diagnosis not present

## 2023-01-26 DIAGNOSIS — R630 Anorexia: Secondary | ICD-10-CM | POA: Diagnosis not present

## 2023-01-26 DIAGNOSIS — K219 Gastro-esophageal reflux disease without esophagitis: Secondary | ICD-10-CM | POA: Diagnosis not present

## 2023-01-26 DIAGNOSIS — R52 Pain, unspecified: Secondary | ICD-10-CM | POA: Diagnosis not present

## 2023-01-26 DIAGNOSIS — I519 Heart disease, unspecified: Secondary | ICD-10-CM | POA: Diagnosis not present

## 2023-01-26 DIAGNOSIS — I10 Essential (primary) hypertension: Secondary | ICD-10-CM | POA: Diagnosis not present

## 2023-01-27 DIAGNOSIS — I10 Essential (primary) hypertension: Secondary | ICD-10-CM | POA: Diagnosis not present

## 2023-01-27 DIAGNOSIS — R42 Dizziness and giddiness: Secondary | ICD-10-CM | POA: Diagnosis not present

## 2023-01-27 DIAGNOSIS — F32A Depression, unspecified: Secondary | ICD-10-CM | POA: Diagnosis not present

## 2023-01-27 DIAGNOSIS — R0602 Shortness of breath: Secondary | ICD-10-CM | POA: Diagnosis not present

## 2023-01-27 DIAGNOSIS — R52 Pain, unspecified: Secondary | ICD-10-CM | POA: Diagnosis not present

## 2023-01-27 DIAGNOSIS — I519 Heart disease, unspecified: Secondary | ICD-10-CM | POA: Diagnosis not present

## 2023-01-27 DIAGNOSIS — R112 Nausea with vomiting, unspecified: Secondary | ICD-10-CM | POA: Diagnosis not present

## 2023-01-27 DIAGNOSIS — R531 Weakness: Secondary | ICD-10-CM | POA: Diagnosis not present

## 2023-01-27 DIAGNOSIS — C3490 Malignant neoplasm of unspecified part of unspecified bronchus or lung: Secondary | ICD-10-CM | POA: Diagnosis not present

## 2023-01-27 DIAGNOSIS — R630 Anorexia: Secondary | ICD-10-CM | POA: Diagnosis not present

## 2023-01-27 DIAGNOSIS — I480 Paroxysmal atrial fibrillation: Secondary | ICD-10-CM | POA: Diagnosis not present

## 2023-01-27 DIAGNOSIS — I639 Cerebral infarction, unspecified: Secondary | ICD-10-CM | POA: Diagnosis not present

## 2023-01-27 DIAGNOSIS — Z8546 Personal history of malignant neoplasm of prostate: Secondary | ICD-10-CM | POA: Diagnosis not present

## 2023-01-27 DIAGNOSIS — K219 Gastro-esophageal reflux disease without esophagitis: Secondary | ICD-10-CM | POA: Diagnosis not present

## 2023-01-27 DIAGNOSIS — F419 Anxiety disorder, unspecified: Secondary | ICD-10-CM | POA: Diagnosis not present

## 2023-01-27 DIAGNOSIS — E785 Hyperlipidemia, unspecified: Secondary | ICD-10-CM | POA: Diagnosis not present

## 2023-01-28 DIAGNOSIS — I480 Paroxysmal atrial fibrillation: Secondary | ICD-10-CM | POA: Diagnosis not present

## 2023-01-28 DIAGNOSIS — K219 Gastro-esophageal reflux disease without esophagitis: Secondary | ICD-10-CM | POA: Diagnosis not present

## 2023-01-28 DIAGNOSIS — F419 Anxiety disorder, unspecified: Secondary | ICD-10-CM | POA: Diagnosis not present

## 2023-01-28 DIAGNOSIS — C3490 Malignant neoplasm of unspecified part of unspecified bronchus or lung: Secondary | ICD-10-CM | POA: Diagnosis not present

## 2023-01-28 DIAGNOSIS — F32A Depression, unspecified: Secondary | ICD-10-CM | POA: Diagnosis not present

## 2023-01-28 DIAGNOSIS — E785 Hyperlipidemia, unspecified: Secondary | ICD-10-CM | POA: Diagnosis not present

## 2023-01-28 DIAGNOSIS — I519 Heart disease, unspecified: Secondary | ICD-10-CM | POA: Diagnosis not present

## 2023-01-28 DIAGNOSIS — R42 Dizziness and giddiness: Secondary | ICD-10-CM | POA: Diagnosis not present

## 2023-01-28 DIAGNOSIS — R0602 Shortness of breath: Secondary | ICD-10-CM | POA: Diagnosis not present

## 2023-01-28 DIAGNOSIS — I10 Essential (primary) hypertension: Secondary | ICD-10-CM | POA: Diagnosis not present

## 2023-01-28 DIAGNOSIS — R630 Anorexia: Secondary | ICD-10-CM | POA: Diagnosis not present

## 2023-01-28 DIAGNOSIS — R112 Nausea with vomiting, unspecified: Secondary | ICD-10-CM | POA: Diagnosis not present

## 2023-01-28 DIAGNOSIS — I639 Cerebral infarction, unspecified: Secondary | ICD-10-CM | POA: Diagnosis not present

## 2023-01-28 DIAGNOSIS — R52 Pain, unspecified: Secondary | ICD-10-CM | POA: Diagnosis not present

## 2023-01-28 DIAGNOSIS — Z8546 Personal history of malignant neoplasm of prostate: Secondary | ICD-10-CM | POA: Diagnosis not present

## 2023-01-28 DIAGNOSIS — R531 Weakness: Secondary | ICD-10-CM | POA: Diagnosis not present

## 2023-01-29 DIAGNOSIS — F419 Anxiety disorder, unspecified: Secondary | ICD-10-CM | POA: Diagnosis not present

## 2023-01-29 DIAGNOSIS — K219 Gastro-esophageal reflux disease without esophagitis: Secondary | ICD-10-CM | POA: Diagnosis not present

## 2023-01-29 DIAGNOSIS — I639 Cerebral infarction, unspecified: Secondary | ICD-10-CM | POA: Diagnosis not present

## 2023-01-29 DIAGNOSIS — R0602 Shortness of breath: Secondary | ICD-10-CM | POA: Diagnosis not present

## 2023-01-29 DIAGNOSIS — I10 Essential (primary) hypertension: Secondary | ICD-10-CM | POA: Diagnosis not present

## 2023-01-29 DIAGNOSIS — R630 Anorexia: Secondary | ICD-10-CM | POA: Diagnosis not present

## 2023-01-29 DIAGNOSIS — C3490 Malignant neoplasm of unspecified part of unspecified bronchus or lung: Secondary | ICD-10-CM | POA: Diagnosis not present

## 2023-01-29 DIAGNOSIS — R531 Weakness: Secondary | ICD-10-CM | POA: Diagnosis not present

## 2023-01-29 DIAGNOSIS — F32A Depression, unspecified: Secondary | ICD-10-CM | POA: Diagnosis not present

## 2023-01-29 DIAGNOSIS — I480 Paroxysmal atrial fibrillation: Secondary | ICD-10-CM | POA: Diagnosis not present

## 2023-01-29 DIAGNOSIS — R112 Nausea with vomiting, unspecified: Secondary | ICD-10-CM | POA: Diagnosis not present

## 2023-01-29 DIAGNOSIS — R42 Dizziness and giddiness: Secondary | ICD-10-CM | POA: Diagnosis not present

## 2023-01-29 DIAGNOSIS — Z8546 Personal history of malignant neoplasm of prostate: Secondary | ICD-10-CM | POA: Diagnosis not present

## 2023-01-29 DIAGNOSIS — R52 Pain, unspecified: Secondary | ICD-10-CM | POA: Diagnosis not present

## 2023-01-29 DIAGNOSIS — I519 Heart disease, unspecified: Secondary | ICD-10-CM | POA: Diagnosis not present

## 2023-01-29 DIAGNOSIS — E785 Hyperlipidemia, unspecified: Secondary | ICD-10-CM | POA: Diagnosis not present

## 2023-01-30 DIAGNOSIS — E785 Hyperlipidemia, unspecified: Secondary | ICD-10-CM | POA: Diagnosis not present

## 2023-01-30 DIAGNOSIS — R52 Pain, unspecified: Secondary | ICD-10-CM | POA: Diagnosis not present

## 2023-01-30 DIAGNOSIS — K219 Gastro-esophageal reflux disease without esophagitis: Secondary | ICD-10-CM | POA: Diagnosis not present

## 2023-01-30 DIAGNOSIS — I10 Essential (primary) hypertension: Secondary | ICD-10-CM | POA: Diagnosis not present

## 2023-01-30 DIAGNOSIS — I480 Paroxysmal atrial fibrillation: Secondary | ICD-10-CM | POA: Diagnosis not present

## 2023-01-30 DIAGNOSIS — F32A Depression, unspecified: Secondary | ICD-10-CM | POA: Diagnosis not present

## 2023-01-30 DIAGNOSIS — R42 Dizziness and giddiness: Secondary | ICD-10-CM | POA: Diagnosis not present

## 2023-01-30 DIAGNOSIS — F419 Anxiety disorder, unspecified: Secondary | ICD-10-CM | POA: Diagnosis not present

## 2023-01-30 DIAGNOSIS — R531 Weakness: Secondary | ICD-10-CM | POA: Diagnosis not present

## 2023-01-30 DIAGNOSIS — R630 Anorexia: Secondary | ICD-10-CM | POA: Diagnosis not present

## 2023-01-30 DIAGNOSIS — R0602 Shortness of breath: Secondary | ICD-10-CM | POA: Diagnosis not present

## 2023-01-30 DIAGNOSIS — I519 Heart disease, unspecified: Secondary | ICD-10-CM | POA: Diagnosis not present

## 2023-01-30 DIAGNOSIS — C3490 Malignant neoplasm of unspecified part of unspecified bronchus or lung: Secondary | ICD-10-CM | POA: Diagnosis not present

## 2023-01-30 DIAGNOSIS — I639 Cerebral infarction, unspecified: Secondary | ICD-10-CM | POA: Diagnosis not present

## 2023-01-30 DIAGNOSIS — Z8546 Personal history of malignant neoplasm of prostate: Secondary | ICD-10-CM | POA: Diagnosis not present

## 2023-01-30 DIAGNOSIS — R112 Nausea with vomiting, unspecified: Secondary | ICD-10-CM | POA: Diagnosis not present

## 2023-01-31 DIAGNOSIS — C3490 Malignant neoplasm of unspecified part of unspecified bronchus or lung: Secondary | ICD-10-CM | POA: Diagnosis not present

## 2023-01-31 DIAGNOSIS — R630 Anorexia: Secondary | ICD-10-CM | POA: Diagnosis not present

## 2023-01-31 DIAGNOSIS — K219 Gastro-esophageal reflux disease without esophagitis: Secondary | ICD-10-CM | POA: Diagnosis not present

## 2023-01-31 DIAGNOSIS — Z8546 Personal history of malignant neoplasm of prostate: Secondary | ICD-10-CM | POA: Diagnosis not present

## 2023-01-31 DIAGNOSIS — R112 Nausea with vomiting, unspecified: Secondary | ICD-10-CM | POA: Diagnosis not present

## 2023-01-31 DIAGNOSIS — F32A Depression, unspecified: Secondary | ICD-10-CM | POA: Diagnosis not present

## 2023-01-31 DIAGNOSIS — R52 Pain, unspecified: Secondary | ICD-10-CM | POA: Diagnosis not present

## 2023-01-31 DIAGNOSIS — F419 Anxiety disorder, unspecified: Secondary | ICD-10-CM | POA: Diagnosis not present

## 2023-01-31 DIAGNOSIS — R457 State of emotional shock and stress, unspecified: Secondary | ICD-10-CM | POA: Diagnosis not present

## 2023-01-31 DIAGNOSIS — R0602 Shortness of breath: Secondary | ICD-10-CM | POA: Diagnosis not present

## 2023-01-31 DIAGNOSIS — R531 Weakness: Secondary | ICD-10-CM | POA: Diagnosis not present

## 2023-01-31 DIAGNOSIS — I480 Paroxysmal atrial fibrillation: Secondary | ICD-10-CM | POA: Diagnosis not present

## 2023-01-31 DIAGNOSIS — R42 Dizziness and giddiness: Secondary | ICD-10-CM | POA: Diagnosis not present

## 2023-01-31 DIAGNOSIS — I10 Essential (primary) hypertension: Secondary | ICD-10-CM | POA: Diagnosis not present

## 2023-01-31 DIAGNOSIS — I639 Cerebral infarction, unspecified: Secondary | ICD-10-CM | POA: Diagnosis not present

## 2023-01-31 DIAGNOSIS — R0902 Hypoxemia: Secondary | ICD-10-CM | POA: Diagnosis not present

## 2023-01-31 DIAGNOSIS — E785 Hyperlipidemia, unspecified: Secondary | ICD-10-CM | POA: Diagnosis not present

## 2023-01-31 DIAGNOSIS — I519 Heart disease, unspecified: Secondary | ICD-10-CM | POA: Diagnosis not present

## 2023-02-01 DIAGNOSIS — I639 Cerebral infarction, unspecified: Secondary | ICD-10-CM | POA: Diagnosis not present

## 2023-02-01 DIAGNOSIS — I480 Paroxysmal atrial fibrillation: Secondary | ICD-10-CM | POA: Diagnosis not present

## 2023-02-01 DIAGNOSIS — I519 Heart disease, unspecified: Secondary | ICD-10-CM | POA: Diagnosis not present

## 2023-02-01 DIAGNOSIS — R42 Dizziness and giddiness: Secondary | ICD-10-CM | POA: Diagnosis not present

## 2023-02-01 DIAGNOSIS — Z8546 Personal history of malignant neoplasm of prostate: Secondary | ICD-10-CM | POA: Diagnosis not present

## 2023-02-01 DIAGNOSIS — R0602 Shortness of breath: Secondary | ICD-10-CM | POA: Diagnosis not present

## 2023-02-01 DIAGNOSIS — R531 Weakness: Secondary | ICD-10-CM | POA: Diagnosis not present

## 2023-02-01 DIAGNOSIS — F419 Anxiety disorder, unspecified: Secondary | ICD-10-CM | POA: Diagnosis not present

## 2023-02-01 DIAGNOSIS — R52 Pain, unspecified: Secondary | ICD-10-CM | POA: Diagnosis not present

## 2023-02-01 DIAGNOSIS — R112 Nausea with vomiting, unspecified: Secondary | ICD-10-CM | POA: Diagnosis not present

## 2023-02-01 DIAGNOSIS — E785 Hyperlipidemia, unspecified: Secondary | ICD-10-CM | POA: Diagnosis not present

## 2023-02-01 DIAGNOSIS — R630 Anorexia: Secondary | ICD-10-CM | POA: Diagnosis not present

## 2023-02-01 DIAGNOSIS — K219 Gastro-esophageal reflux disease without esophagitis: Secondary | ICD-10-CM | POA: Diagnosis not present

## 2023-02-01 DIAGNOSIS — F32A Depression, unspecified: Secondary | ICD-10-CM | POA: Diagnosis not present

## 2023-02-01 DIAGNOSIS — C3490 Malignant neoplasm of unspecified part of unspecified bronchus or lung: Secondary | ICD-10-CM | POA: Diagnosis not present

## 2023-02-01 DIAGNOSIS — I10 Essential (primary) hypertension: Secondary | ICD-10-CM | POA: Diagnosis not present

## 2023-02-02 DIAGNOSIS — F32A Depression, unspecified: Secondary | ICD-10-CM | POA: Diagnosis not present

## 2023-02-02 DIAGNOSIS — C3490 Malignant neoplasm of unspecified part of unspecified bronchus or lung: Secondary | ICD-10-CM | POA: Diagnosis not present

## 2023-02-02 DIAGNOSIS — I480 Paroxysmal atrial fibrillation: Secondary | ICD-10-CM | POA: Diagnosis not present

## 2023-02-02 DIAGNOSIS — R630 Anorexia: Secondary | ICD-10-CM | POA: Diagnosis not present

## 2023-02-02 DIAGNOSIS — Z8546 Personal history of malignant neoplasm of prostate: Secondary | ICD-10-CM | POA: Diagnosis not present

## 2023-02-02 DIAGNOSIS — R531 Weakness: Secondary | ICD-10-CM | POA: Diagnosis not present

## 2023-02-02 DIAGNOSIS — R112 Nausea with vomiting, unspecified: Secondary | ICD-10-CM | POA: Diagnosis not present

## 2023-02-02 DIAGNOSIS — R52 Pain, unspecified: Secondary | ICD-10-CM | POA: Diagnosis not present

## 2023-02-02 DIAGNOSIS — I639 Cerebral infarction, unspecified: Secondary | ICD-10-CM | POA: Diagnosis not present

## 2023-02-02 DIAGNOSIS — R0602 Shortness of breath: Secondary | ICD-10-CM | POA: Diagnosis not present

## 2023-02-02 DIAGNOSIS — K219 Gastro-esophageal reflux disease without esophagitis: Secondary | ICD-10-CM | POA: Diagnosis not present

## 2023-02-02 DIAGNOSIS — E785 Hyperlipidemia, unspecified: Secondary | ICD-10-CM | POA: Diagnosis not present

## 2023-02-02 DIAGNOSIS — I519 Heart disease, unspecified: Secondary | ICD-10-CM | POA: Diagnosis not present

## 2023-02-02 DIAGNOSIS — R42 Dizziness and giddiness: Secondary | ICD-10-CM | POA: Diagnosis not present

## 2023-02-02 DIAGNOSIS — F419 Anxiety disorder, unspecified: Secondary | ICD-10-CM | POA: Diagnosis not present

## 2023-02-02 DIAGNOSIS — I10 Essential (primary) hypertension: Secondary | ICD-10-CM | POA: Diagnosis not present

## 2023-02-03 DIAGNOSIS — K219 Gastro-esophageal reflux disease without esophagitis: Secondary | ICD-10-CM | POA: Diagnosis not present

## 2023-02-03 DIAGNOSIS — Z8546 Personal history of malignant neoplasm of prostate: Secondary | ICD-10-CM | POA: Diagnosis not present

## 2023-02-03 DIAGNOSIS — I519 Heart disease, unspecified: Secondary | ICD-10-CM | POA: Diagnosis not present

## 2023-02-03 DIAGNOSIS — R42 Dizziness and giddiness: Secondary | ICD-10-CM | POA: Diagnosis not present

## 2023-02-03 DIAGNOSIS — R112 Nausea with vomiting, unspecified: Secondary | ICD-10-CM | POA: Diagnosis not present

## 2023-02-03 DIAGNOSIS — C3490 Malignant neoplasm of unspecified part of unspecified bronchus or lung: Secondary | ICD-10-CM | POA: Diagnosis not present

## 2023-02-03 DIAGNOSIS — F32A Depression, unspecified: Secondary | ICD-10-CM | POA: Diagnosis not present

## 2023-02-03 DIAGNOSIS — I10 Essential (primary) hypertension: Secondary | ICD-10-CM | POA: Diagnosis not present

## 2023-02-03 DIAGNOSIS — R52 Pain, unspecified: Secondary | ICD-10-CM | POA: Diagnosis not present

## 2023-02-03 DIAGNOSIS — I639 Cerebral infarction, unspecified: Secondary | ICD-10-CM | POA: Diagnosis not present

## 2023-02-03 DIAGNOSIS — I480 Paroxysmal atrial fibrillation: Secondary | ICD-10-CM | POA: Diagnosis not present

## 2023-02-03 DIAGNOSIS — R630 Anorexia: Secondary | ICD-10-CM | POA: Diagnosis not present

## 2023-02-03 DIAGNOSIS — R0602 Shortness of breath: Secondary | ICD-10-CM | POA: Diagnosis not present

## 2023-02-03 DIAGNOSIS — R531 Weakness: Secondary | ICD-10-CM | POA: Diagnosis not present

## 2023-02-03 DIAGNOSIS — E785 Hyperlipidemia, unspecified: Secondary | ICD-10-CM | POA: Diagnosis not present

## 2023-02-03 DIAGNOSIS — F419 Anxiety disorder, unspecified: Secondary | ICD-10-CM | POA: Diagnosis not present

## 2023-02-04 DIAGNOSIS — I480 Paroxysmal atrial fibrillation: Secondary | ICD-10-CM | POA: Diagnosis not present

## 2023-02-04 DIAGNOSIS — I519 Heart disease, unspecified: Secondary | ICD-10-CM | POA: Diagnosis not present

## 2023-02-04 DIAGNOSIS — R531 Weakness: Secondary | ICD-10-CM | POA: Diagnosis not present

## 2023-02-04 DIAGNOSIS — I639 Cerebral infarction, unspecified: Secondary | ICD-10-CM | POA: Diagnosis not present

## 2023-02-04 DIAGNOSIS — I10 Essential (primary) hypertension: Secondary | ICD-10-CM | POA: Diagnosis not present

## 2023-02-04 DIAGNOSIS — F32A Depression, unspecified: Secondary | ICD-10-CM | POA: Diagnosis not present

## 2023-02-04 DIAGNOSIS — R630 Anorexia: Secondary | ICD-10-CM | POA: Diagnosis not present

## 2023-02-04 DIAGNOSIS — R42 Dizziness and giddiness: Secondary | ICD-10-CM | POA: Diagnosis not present

## 2023-02-04 DIAGNOSIS — R0602 Shortness of breath: Secondary | ICD-10-CM | POA: Diagnosis not present

## 2023-02-04 DIAGNOSIS — Z8546 Personal history of malignant neoplasm of prostate: Secondary | ICD-10-CM | POA: Diagnosis not present

## 2023-02-04 DIAGNOSIS — C3490 Malignant neoplasm of unspecified part of unspecified bronchus or lung: Secondary | ICD-10-CM | POA: Diagnosis not present

## 2023-02-04 DIAGNOSIS — K219 Gastro-esophageal reflux disease without esophagitis: Secondary | ICD-10-CM | POA: Diagnosis not present

## 2023-02-04 DIAGNOSIS — F419 Anxiety disorder, unspecified: Secondary | ICD-10-CM | POA: Diagnosis not present

## 2023-02-04 DIAGNOSIS — E785 Hyperlipidemia, unspecified: Secondary | ICD-10-CM | POA: Diagnosis not present

## 2023-02-04 DIAGNOSIS — R112 Nausea with vomiting, unspecified: Secondary | ICD-10-CM | POA: Diagnosis not present

## 2023-02-04 DIAGNOSIS — R52 Pain, unspecified: Secondary | ICD-10-CM | POA: Diagnosis not present

## 2023-02-05 DIAGNOSIS — R52 Pain, unspecified: Secondary | ICD-10-CM | POA: Diagnosis not present

## 2023-02-05 DIAGNOSIS — I480 Paroxysmal atrial fibrillation: Secondary | ICD-10-CM | POA: Diagnosis not present

## 2023-02-05 DIAGNOSIS — F419 Anxiety disorder, unspecified: Secondary | ICD-10-CM | POA: Diagnosis not present

## 2023-02-05 DIAGNOSIS — F32A Depression, unspecified: Secondary | ICD-10-CM | POA: Diagnosis not present

## 2023-02-05 DIAGNOSIS — E785 Hyperlipidemia, unspecified: Secondary | ICD-10-CM | POA: Diagnosis not present

## 2023-02-05 DIAGNOSIS — R112 Nausea with vomiting, unspecified: Secondary | ICD-10-CM | POA: Diagnosis not present

## 2023-02-05 DIAGNOSIS — R630 Anorexia: Secondary | ICD-10-CM | POA: Diagnosis not present

## 2023-02-05 DIAGNOSIS — Z8546 Personal history of malignant neoplasm of prostate: Secondary | ICD-10-CM | POA: Diagnosis not present

## 2023-02-05 DIAGNOSIS — I519 Heart disease, unspecified: Secondary | ICD-10-CM | POA: Diagnosis not present

## 2023-02-05 DIAGNOSIS — I10 Essential (primary) hypertension: Secondary | ICD-10-CM | POA: Diagnosis not present

## 2023-02-05 DIAGNOSIS — R531 Weakness: Secondary | ICD-10-CM | POA: Diagnosis not present

## 2023-02-05 DIAGNOSIS — R42 Dizziness and giddiness: Secondary | ICD-10-CM | POA: Diagnosis not present

## 2023-02-05 DIAGNOSIS — R0602 Shortness of breath: Secondary | ICD-10-CM | POA: Diagnosis not present

## 2023-02-05 DIAGNOSIS — I639 Cerebral infarction, unspecified: Secondary | ICD-10-CM | POA: Diagnosis not present

## 2023-02-05 DIAGNOSIS — C3490 Malignant neoplasm of unspecified part of unspecified bronchus or lung: Secondary | ICD-10-CM | POA: Diagnosis not present

## 2023-02-05 DIAGNOSIS — K219 Gastro-esophageal reflux disease without esophagitis: Secondary | ICD-10-CM | POA: Diagnosis not present

## 2023-02-06 DIAGNOSIS — R52 Pain, unspecified: Secondary | ICD-10-CM | POA: Diagnosis not present

## 2023-02-06 DIAGNOSIS — R112 Nausea with vomiting, unspecified: Secondary | ICD-10-CM | POA: Diagnosis not present

## 2023-02-06 DIAGNOSIS — R42 Dizziness and giddiness: Secondary | ICD-10-CM | POA: Diagnosis not present

## 2023-02-06 DIAGNOSIS — K219 Gastro-esophageal reflux disease without esophagitis: Secondary | ICD-10-CM | POA: Diagnosis not present

## 2023-02-06 DIAGNOSIS — C3490 Malignant neoplasm of unspecified part of unspecified bronchus or lung: Secondary | ICD-10-CM | POA: Diagnosis not present

## 2023-02-06 DIAGNOSIS — R0602 Shortness of breath: Secondary | ICD-10-CM | POA: Diagnosis not present

## 2023-02-06 DIAGNOSIS — Z8546 Personal history of malignant neoplasm of prostate: Secondary | ICD-10-CM | POA: Diagnosis not present

## 2023-02-06 DIAGNOSIS — R630 Anorexia: Secondary | ICD-10-CM | POA: Diagnosis not present

## 2023-02-06 DIAGNOSIS — I639 Cerebral infarction, unspecified: Secondary | ICD-10-CM | POA: Diagnosis not present

## 2023-02-06 DIAGNOSIS — F419 Anxiety disorder, unspecified: Secondary | ICD-10-CM | POA: Diagnosis not present

## 2023-02-06 DIAGNOSIS — E785 Hyperlipidemia, unspecified: Secondary | ICD-10-CM | POA: Diagnosis not present

## 2023-02-06 DIAGNOSIS — I480 Paroxysmal atrial fibrillation: Secondary | ICD-10-CM | POA: Diagnosis not present

## 2023-02-06 DIAGNOSIS — I10 Essential (primary) hypertension: Secondary | ICD-10-CM | POA: Diagnosis not present

## 2023-02-06 DIAGNOSIS — I519 Heart disease, unspecified: Secondary | ICD-10-CM | POA: Diagnosis not present

## 2023-02-06 DIAGNOSIS — R531 Weakness: Secondary | ICD-10-CM | POA: Diagnosis not present

## 2023-02-06 DIAGNOSIS — F32A Depression, unspecified: Secondary | ICD-10-CM | POA: Diagnosis not present

## 2023-02-07 DIAGNOSIS — E785 Hyperlipidemia, unspecified: Secondary | ICD-10-CM | POA: Diagnosis not present

## 2023-02-07 DIAGNOSIS — R42 Dizziness and giddiness: Secondary | ICD-10-CM | POA: Diagnosis not present

## 2023-02-07 DIAGNOSIS — F32A Depression, unspecified: Secondary | ICD-10-CM | POA: Diagnosis not present

## 2023-02-07 DIAGNOSIS — I639 Cerebral infarction, unspecified: Secondary | ICD-10-CM | POA: Diagnosis not present

## 2023-02-07 DIAGNOSIS — I10 Essential (primary) hypertension: Secondary | ICD-10-CM | POA: Diagnosis not present

## 2023-02-07 DIAGNOSIS — I519 Heart disease, unspecified: Secondary | ICD-10-CM | POA: Diagnosis not present

## 2023-02-07 DIAGNOSIS — R531 Weakness: Secondary | ICD-10-CM | POA: Diagnosis not present

## 2023-02-07 DIAGNOSIS — F419 Anxiety disorder, unspecified: Secondary | ICD-10-CM | POA: Diagnosis not present

## 2023-02-07 DIAGNOSIS — C3490 Malignant neoplasm of unspecified part of unspecified bronchus or lung: Secondary | ICD-10-CM | POA: Diagnosis not present

## 2023-02-07 DIAGNOSIS — K219 Gastro-esophageal reflux disease without esophagitis: Secondary | ICD-10-CM | POA: Diagnosis not present

## 2023-02-07 DIAGNOSIS — R52 Pain, unspecified: Secondary | ICD-10-CM | POA: Diagnosis not present

## 2023-02-07 DIAGNOSIS — Z8546 Personal history of malignant neoplasm of prostate: Secondary | ICD-10-CM | POA: Diagnosis not present

## 2023-02-07 DIAGNOSIS — R630 Anorexia: Secondary | ICD-10-CM | POA: Diagnosis not present

## 2023-02-07 DIAGNOSIS — R0602 Shortness of breath: Secondary | ICD-10-CM | POA: Diagnosis not present

## 2023-02-07 DIAGNOSIS — R112 Nausea with vomiting, unspecified: Secondary | ICD-10-CM | POA: Diagnosis not present

## 2023-02-07 DIAGNOSIS — I480 Paroxysmal atrial fibrillation: Secondary | ICD-10-CM | POA: Diagnosis not present

## 2023-02-08 DIAGNOSIS — Z8546 Personal history of malignant neoplasm of prostate: Secondary | ICD-10-CM | POA: Diagnosis not present

## 2023-02-08 DIAGNOSIS — I10 Essential (primary) hypertension: Secondary | ICD-10-CM | POA: Diagnosis not present

## 2023-02-08 DIAGNOSIS — C3490 Malignant neoplasm of unspecified part of unspecified bronchus or lung: Secondary | ICD-10-CM | POA: Diagnosis not present

## 2023-02-08 DIAGNOSIS — I519 Heart disease, unspecified: Secondary | ICD-10-CM | POA: Diagnosis not present

## 2023-02-08 DIAGNOSIS — R531 Weakness: Secondary | ICD-10-CM | POA: Diagnosis not present

## 2023-02-08 DIAGNOSIS — R112 Nausea with vomiting, unspecified: Secondary | ICD-10-CM | POA: Diagnosis not present

## 2023-02-08 DIAGNOSIS — I639 Cerebral infarction, unspecified: Secondary | ICD-10-CM | POA: Diagnosis not present

## 2023-02-08 DIAGNOSIS — K219 Gastro-esophageal reflux disease without esophagitis: Secondary | ICD-10-CM | POA: Diagnosis not present

## 2023-02-08 DIAGNOSIS — R52 Pain, unspecified: Secondary | ICD-10-CM | POA: Diagnosis not present

## 2023-02-08 DIAGNOSIS — R42 Dizziness and giddiness: Secondary | ICD-10-CM | POA: Diagnosis not present

## 2023-02-08 DIAGNOSIS — E785 Hyperlipidemia, unspecified: Secondary | ICD-10-CM | POA: Diagnosis not present

## 2023-02-08 DIAGNOSIS — F419 Anxiety disorder, unspecified: Secondary | ICD-10-CM | POA: Diagnosis not present

## 2023-02-08 DIAGNOSIS — F32A Depression, unspecified: Secondary | ICD-10-CM | POA: Diagnosis not present

## 2023-02-08 DIAGNOSIS — R0602 Shortness of breath: Secondary | ICD-10-CM | POA: Diagnosis not present

## 2023-02-08 DIAGNOSIS — I480 Paroxysmal atrial fibrillation: Secondary | ICD-10-CM | POA: Diagnosis not present

## 2023-02-08 DIAGNOSIS — R630 Anorexia: Secondary | ICD-10-CM | POA: Diagnosis not present

## 2023-02-09 DIAGNOSIS — K219 Gastro-esophageal reflux disease without esophagitis: Secondary | ICD-10-CM | POA: Diagnosis not present

## 2023-02-09 DIAGNOSIS — I10 Essential (primary) hypertension: Secondary | ICD-10-CM | POA: Diagnosis not present

## 2023-02-09 DIAGNOSIS — Z8546 Personal history of malignant neoplasm of prostate: Secondary | ICD-10-CM | POA: Diagnosis not present

## 2023-02-09 DIAGNOSIS — R52 Pain, unspecified: Secondary | ICD-10-CM | POA: Diagnosis not present

## 2023-02-09 DIAGNOSIS — R531 Weakness: Secondary | ICD-10-CM | POA: Diagnosis not present

## 2023-02-09 DIAGNOSIS — C3490 Malignant neoplasm of unspecified part of unspecified bronchus or lung: Secondary | ICD-10-CM | POA: Diagnosis not present

## 2023-02-09 DIAGNOSIS — R630 Anorexia: Secondary | ICD-10-CM | POA: Diagnosis not present

## 2023-02-09 DIAGNOSIS — E785 Hyperlipidemia, unspecified: Secondary | ICD-10-CM | POA: Diagnosis not present

## 2023-02-09 DIAGNOSIS — F32A Depression, unspecified: Secondary | ICD-10-CM | POA: Diagnosis not present

## 2023-02-09 DIAGNOSIS — R0602 Shortness of breath: Secondary | ICD-10-CM | POA: Diagnosis not present

## 2023-02-09 DIAGNOSIS — I519 Heart disease, unspecified: Secondary | ICD-10-CM | POA: Diagnosis not present

## 2023-02-09 DIAGNOSIS — I480 Paroxysmal atrial fibrillation: Secondary | ICD-10-CM | POA: Diagnosis not present

## 2023-02-09 DIAGNOSIS — I639 Cerebral infarction, unspecified: Secondary | ICD-10-CM | POA: Diagnosis not present

## 2023-02-09 DIAGNOSIS — R112 Nausea with vomiting, unspecified: Secondary | ICD-10-CM | POA: Diagnosis not present

## 2023-02-09 DIAGNOSIS — F419 Anxiety disorder, unspecified: Secondary | ICD-10-CM | POA: Diagnosis not present

## 2023-02-09 DIAGNOSIS — R42 Dizziness and giddiness: Secondary | ICD-10-CM | POA: Diagnosis not present

## 2023-02-10 DIAGNOSIS — I639 Cerebral infarction, unspecified: Secondary | ICD-10-CM | POA: Diagnosis not present

## 2023-02-10 DIAGNOSIS — E785 Hyperlipidemia, unspecified: Secondary | ICD-10-CM | POA: Diagnosis not present

## 2023-02-10 DIAGNOSIS — Z8546 Personal history of malignant neoplasm of prostate: Secondary | ICD-10-CM | POA: Diagnosis not present

## 2023-02-10 DIAGNOSIS — C3490 Malignant neoplasm of unspecified part of unspecified bronchus or lung: Secondary | ICD-10-CM | POA: Diagnosis not present

## 2023-02-10 DIAGNOSIS — R52 Pain, unspecified: Secondary | ICD-10-CM | POA: Diagnosis not present

## 2023-02-10 DIAGNOSIS — R630 Anorexia: Secondary | ICD-10-CM | POA: Diagnosis not present

## 2023-02-10 DIAGNOSIS — F419 Anxiety disorder, unspecified: Secondary | ICD-10-CM | POA: Diagnosis not present

## 2023-02-10 DIAGNOSIS — K219 Gastro-esophageal reflux disease without esophagitis: Secondary | ICD-10-CM | POA: Diagnosis not present

## 2023-02-10 DIAGNOSIS — I519 Heart disease, unspecified: Secondary | ICD-10-CM | POA: Diagnosis not present

## 2023-02-10 DIAGNOSIS — F32A Depression, unspecified: Secondary | ICD-10-CM | POA: Diagnosis not present

## 2023-02-10 DIAGNOSIS — R0602 Shortness of breath: Secondary | ICD-10-CM | POA: Diagnosis not present

## 2023-02-10 DIAGNOSIS — R531 Weakness: Secondary | ICD-10-CM | POA: Diagnosis not present

## 2023-02-10 DIAGNOSIS — R112 Nausea with vomiting, unspecified: Secondary | ICD-10-CM | POA: Diagnosis not present

## 2023-02-10 DIAGNOSIS — R42 Dizziness and giddiness: Secondary | ICD-10-CM | POA: Diagnosis not present

## 2023-02-10 DIAGNOSIS — I480 Paroxysmal atrial fibrillation: Secondary | ICD-10-CM | POA: Diagnosis not present

## 2023-02-10 DIAGNOSIS — I10 Essential (primary) hypertension: Secondary | ICD-10-CM | POA: Diagnosis not present

## 2023-02-11 DIAGNOSIS — K219 Gastro-esophageal reflux disease without esophagitis: Secondary | ICD-10-CM | POA: Diagnosis not present

## 2023-02-11 DIAGNOSIS — I10 Essential (primary) hypertension: Secondary | ICD-10-CM | POA: Diagnosis not present

## 2023-02-11 DIAGNOSIS — C3490 Malignant neoplasm of unspecified part of unspecified bronchus or lung: Secondary | ICD-10-CM | POA: Diagnosis not present

## 2023-02-11 DIAGNOSIS — R42 Dizziness and giddiness: Secondary | ICD-10-CM | POA: Diagnosis not present

## 2023-02-11 DIAGNOSIS — I480 Paroxysmal atrial fibrillation: Secondary | ICD-10-CM | POA: Diagnosis not present

## 2023-02-11 DIAGNOSIS — F419 Anxiety disorder, unspecified: Secondary | ICD-10-CM | POA: Diagnosis not present

## 2023-02-11 DIAGNOSIS — R0602 Shortness of breath: Secondary | ICD-10-CM | POA: Diagnosis not present

## 2023-02-11 DIAGNOSIS — R52 Pain, unspecified: Secondary | ICD-10-CM | POA: Diagnosis not present

## 2023-02-11 DIAGNOSIS — E785 Hyperlipidemia, unspecified: Secondary | ICD-10-CM | POA: Diagnosis not present

## 2023-02-11 DIAGNOSIS — R630 Anorexia: Secondary | ICD-10-CM | POA: Diagnosis not present

## 2023-02-11 DIAGNOSIS — Z8546 Personal history of malignant neoplasm of prostate: Secondary | ICD-10-CM | POA: Diagnosis not present

## 2023-02-11 DIAGNOSIS — R112 Nausea with vomiting, unspecified: Secondary | ICD-10-CM | POA: Diagnosis not present

## 2023-02-11 DIAGNOSIS — R531 Weakness: Secondary | ICD-10-CM | POA: Diagnosis not present

## 2023-02-11 DIAGNOSIS — I519 Heart disease, unspecified: Secondary | ICD-10-CM | POA: Diagnosis not present

## 2023-02-11 DIAGNOSIS — I639 Cerebral infarction, unspecified: Secondary | ICD-10-CM | POA: Diagnosis not present

## 2023-02-11 DIAGNOSIS — F32A Depression, unspecified: Secondary | ICD-10-CM | POA: Diagnosis not present

## 2023-02-12 DIAGNOSIS — I639 Cerebral infarction, unspecified: Secondary | ICD-10-CM | POA: Diagnosis not present

## 2023-02-12 DIAGNOSIS — R42 Dizziness and giddiness: Secondary | ICD-10-CM | POA: Diagnosis not present

## 2023-02-12 DIAGNOSIS — R0602 Shortness of breath: Secondary | ICD-10-CM | POA: Diagnosis not present

## 2023-02-12 DIAGNOSIS — I519 Heart disease, unspecified: Secondary | ICD-10-CM | POA: Diagnosis not present

## 2023-02-12 DIAGNOSIS — E785 Hyperlipidemia, unspecified: Secondary | ICD-10-CM | POA: Diagnosis not present

## 2023-02-12 DIAGNOSIS — R52 Pain, unspecified: Secondary | ICD-10-CM | POA: Diagnosis not present

## 2023-02-12 DIAGNOSIS — R630 Anorexia: Secondary | ICD-10-CM | POA: Diagnosis not present

## 2023-02-12 DIAGNOSIS — K219 Gastro-esophageal reflux disease without esophagitis: Secondary | ICD-10-CM | POA: Diagnosis not present

## 2023-02-12 DIAGNOSIS — C3490 Malignant neoplasm of unspecified part of unspecified bronchus or lung: Secondary | ICD-10-CM | POA: Diagnosis not present

## 2023-02-12 DIAGNOSIS — F32A Depression, unspecified: Secondary | ICD-10-CM | POA: Diagnosis not present

## 2023-02-12 DIAGNOSIS — R112 Nausea with vomiting, unspecified: Secondary | ICD-10-CM | POA: Diagnosis not present

## 2023-02-12 DIAGNOSIS — I480 Paroxysmal atrial fibrillation: Secondary | ICD-10-CM | POA: Diagnosis not present

## 2023-02-12 DIAGNOSIS — I10 Essential (primary) hypertension: Secondary | ICD-10-CM | POA: Diagnosis not present

## 2023-02-12 DIAGNOSIS — Z8546 Personal history of malignant neoplasm of prostate: Secondary | ICD-10-CM | POA: Diagnosis not present

## 2023-02-12 DIAGNOSIS — F419 Anxiety disorder, unspecified: Secondary | ICD-10-CM | POA: Diagnosis not present

## 2023-02-12 DIAGNOSIS — R531 Weakness: Secondary | ICD-10-CM | POA: Diagnosis not present

## 2023-02-13 DIAGNOSIS — I639 Cerebral infarction, unspecified: Secondary | ICD-10-CM | POA: Diagnosis not present

## 2023-02-13 DIAGNOSIS — R52 Pain, unspecified: Secondary | ICD-10-CM | POA: Diagnosis not present

## 2023-02-13 DIAGNOSIS — C3490 Malignant neoplasm of unspecified part of unspecified bronchus or lung: Secondary | ICD-10-CM | POA: Diagnosis not present

## 2023-02-13 DIAGNOSIS — R42 Dizziness and giddiness: Secondary | ICD-10-CM | POA: Diagnosis not present

## 2023-02-13 DIAGNOSIS — I519 Heart disease, unspecified: Secondary | ICD-10-CM | POA: Diagnosis not present

## 2023-02-13 DIAGNOSIS — R531 Weakness: Secondary | ICD-10-CM | POA: Diagnosis not present

## 2023-02-13 DIAGNOSIS — R0602 Shortness of breath: Secondary | ICD-10-CM | POA: Diagnosis not present

## 2023-02-13 DIAGNOSIS — F32A Depression, unspecified: Secondary | ICD-10-CM | POA: Diagnosis not present

## 2023-02-13 DIAGNOSIS — Z8546 Personal history of malignant neoplasm of prostate: Secondary | ICD-10-CM | POA: Diagnosis not present

## 2023-02-13 DIAGNOSIS — F419 Anxiety disorder, unspecified: Secondary | ICD-10-CM | POA: Diagnosis not present

## 2023-02-13 DIAGNOSIS — K219 Gastro-esophageal reflux disease without esophagitis: Secondary | ICD-10-CM | POA: Diagnosis not present

## 2023-02-13 DIAGNOSIS — E785 Hyperlipidemia, unspecified: Secondary | ICD-10-CM | POA: Diagnosis not present

## 2023-02-13 DIAGNOSIS — I10 Essential (primary) hypertension: Secondary | ICD-10-CM | POA: Diagnosis not present

## 2023-02-13 DIAGNOSIS — R630 Anorexia: Secondary | ICD-10-CM | POA: Diagnosis not present

## 2023-02-13 DIAGNOSIS — R112 Nausea with vomiting, unspecified: Secondary | ICD-10-CM | POA: Diagnosis not present

## 2023-02-13 DIAGNOSIS — I480 Paroxysmal atrial fibrillation: Secondary | ICD-10-CM | POA: Diagnosis not present

## 2023-02-14 DIAGNOSIS — Z8546 Personal history of malignant neoplasm of prostate: Secondary | ICD-10-CM | POA: Diagnosis not present

## 2023-02-14 DIAGNOSIS — I519 Heart disease, unspecified: Secondary | ICD-10-CM | POA: Diagnosis not present

## 2023-02-14 DIAGNOSIS — C3490 Malignant neoplasm of unspecified part of unspecified bronchus or lung: Secondary | ICD-10-CM | POA: Diagnosis not present

## 2023-02-14 DIAGNOSIS — R0602 Shortness of breath: Secondary | ICD-10-CM | POA: Diagnosis not present

## 2023-02-14 DIAGNOSIS — F419 Anxiety disorder, unspecified: Secondary | ICD-10-CM | POA: Diagnosis not present

## 2023-02-14 DIAGNOSIS — R42 Dizziness and giddiness: Secondary | ICD-10-CM | POA: Diagnosis not present

## 2023-02-14 DIAGNOSIS — R112 Nausea with vomiting, unspecified: Secondary | ICD-10-CM | POA: Diagnosis not present

## 2023-02-14 DIAGNOSIS — I639 Cerebral infarction, unspecified: Secondary | ICD-10-CM | POA: Diagnosis not present

## 2023-02-14 DIAGNOSIS — F32A Depression, unspecified: Secondary | ICD-10-CM | POA: Diagnosis not present

## 2023-02-14 DIAGNOSIS — E785 Hyperlipidemia, unspecified: Secondary | ICD-10-CM | POA: Diagnosis not present

## 2023-02-14 DIAGNOSIS — I480 Paroxysmal atrial fibrillation: Secondary | ICD-10-CM | POA: Diagnosis not present

## 2023-02-14 DIAGNOSIS — I10 Essential (primary) hypertension: Secondary | ICD-10-CM | POA: Diagnosis not present

## 2023-02-14 DIAGNOSIS — R52 Pain, unspecified: Secondary | ICD-10-CM | POA: Diagnosis not present

## 2023-02-14 DIAGNOSIS — K219 Gastro-esophageal reflux disease without esophagitis: Secondary | ICD-10-CM | POA: Diagnosis not present

## 2023-02-14 DIAGNOSIS — R630 Anorexia: Secondary | ICD-10-CM | POA: Diagnosis not present

## 2023-02-14 DIAGNOSIS — R531 Weakness: Secondary | ICD-10-CM | POA: Diagnosis not present

## 2023-02-15 DIAGNOSIS — I519 Heart disease, unspecified: Secondary | ICD-10-CM | POA: Diagnosis not present

## 2023-02-15 DIAGNOSIS — I639 Cerebral infarction, unspecified: Secondary | ICD-10-CM | POA: Diagnosis not present

## 2023-02-15 DIAGNOSIS — R630 Anorexia: Secondary | ICD-10-CM | POA: Diagnosis not present

## 2023-02-15 DIAGNOSIS — R42 Dizziness and giddiness: Secondary | ICD-10-CM | POA: Diagnosis not present

## 2023-02-15 DIAGNOSIS — R531 Weakness: Secondary | ICD-10-CM | POA: Diagnosis not present

## 2023-02-15 DIAGNOSIS — Z8546 Personal history of malignant neoplasm of prostate: Secondary | ICD-10-CM | POA: Diagnosis not present

## 2023-02-15 DIAGNOSIS — F419 Anxiety disorder, unspecified: Secondary | ICD-10-CM | POA: Diagnosis not present

## 2023-02-15 DIAGNOSIS — E785 Hyperlipidemia, unspecified: Secondary | ICD-10-CM | POA: Diagnosis not present

## 2023-02-15 DIAGNOSIS — C3490 Malignant neoplasm of unspecified part of unspecified bronchus or lung: Secondary | ICD-10-CM | POA: Diagnosis not present

## 2023-02-15 DIAGNOSIS — R52 Pain, unspecified: Secondary | ICD-10-CM | POA: Diagnosis not present

## 2023-02-15 DIAGNOSIS — I480 Paroxysmal atrial fibrillation: Secondary | ICD-10-CM | POA: Diagnosis not present

## 2023-02-15 DIAGNOSIS — R112 Nausea with vomiting, unspecified: Secondary | ICD-10-CM | POA: Diagnosis not present

## 2023-02-15 DIAGNOSIS — F32A Depression, unspecified: Secondary | ICD-10-CM | POA: Diagnosis not present

## 2023-02-15 DIAGNOSIS — R0602 Shortness of breath: Secondary | ICD-10-CM | POA: Diagnosis not present

## 2023-02-15 DIAGNOSIS — K219 Gastro-esophageal reflux disease without esophagitis: Secondary | ICD-10-CM | POA: Diagnosis not present

## 2023-02-15 DIAGNOSIS — I10 Essential (primary) hypertension: Secondary | ICD-10-CM | POA: Diagnosis not present

## 2023-02-16 DIAGNOSIS — I639 Cerebral infarction, unspecified: Secondary | ICD-10-CM | POA: Diagnosis not present

## 2023-02-16 DIAGNOSIS — K219 Gastro-esophageal reflux disease without esophagitis: Secondary | ICD-10-CM | POA: Diagnosis not present

## 2023-02-16 DIAGNOSIS — I10 Essential (primary) hypertension: Secondary | ICD-10-CM | POA: Diagnosis not present

## 2023-02-16 DIAGNOSIS — R42 Dizziness and giddiness: Secondary | ICD-10-CM | POA: Diagnosis not present

## 2023-02-16 DIAGNOSIS — I480 Paroxysmal atrial fibrillation: Secondary | ICD-10-CM | POA: Diagnosis not present

## 2023-02-16 DIAGNOSIS — R52 Pain, unspecified: Secondary | ICD-10-CM | POA: Diagnosis not present

## 2023-02-16 DIAGNOSIS — R531 Weakness: Secondary | ICD-10-CM | POA: Diagnosis not present

## 2023-02-16 DIAGNOSIS — R630 Anorexia: Secondary | ICD-10-CM | POA: Diagnosis not present

## 2023-02-16 DIAGNOSIS — I519 Heart disease, unspecified: Secondary | ICD-10-CM | POA: Diagnosis not present

## 2023-02-16 DIAGNOSIS — R0602 Shortness of breath: Secondary | ICD-10-CM | POA: Diagnosis not present

## 2023-02-16 DIAGNOSIS — F419 Anxiety disorder, unspecified: Secondary | ICD-10-CM | POA: Diagnosis not present

## 2023-02-16 DIAGNOSIS — C3490 Malignant neoplasm of unspecified part of unspecified bronchus or lung: Secondary | ICD-10-CM | POA: Diagnosis not present

## 2023-02-16 DIAGNOSIS — E785 Hyperlipidemia, unspecified: Secondary | ICD-10-CM | POA: Diagnosis not present

## 2023-02-16 DIAGNOSIS — F32A Depression, unspecified: Secondary | ICD-10-CM | POA: Diagnosis not present

## 2023-02-16 DIAGNOSIS — R112 Nausea with vomiting, unspecified: Secondary | ICD-10-CM | POA: Diagnosis not present

## 2023-02-16 DIAGNOSIS — Z8546 Personal history of malignant neoplasm of prostate: Secondary | ICD-10-CM | POA: Diagnosis not present

## 2023-02-17 DIAGNOSIS — I10 Essential (primary) hypertension: Secondary | ICD-10-CM | POA: Diagnosis not present

## 2023-02-17 DIAGNOSIS — R630 Anorexia: Secondary | ICD-10-CM | POA: Diagnosis not present

## 2023-02-17 DIAGNOSIS — K219 Gastro-esophageal reflux disease without esophagitis: Secondary | ICD-10-CM | POA: Diagnosis not present

## 2023-02-17 DIAGNOSIS — F32A Depression, unspecified: Secondary | ICD-10-CM | POA: Diagnosis not present

## 2023-02-17 DIAGNOSIS — R112 Nausea with vomiting, unspecified: Secondary | ICD-10-CM | POA: Diagnosis not present

## 2023-02-17 DIAGNOSIS — I639 Cerebral infarction, unspecified: Secondary | ICD-10-CM | POA: Diagnosis not present

## 2023-02-17 DIAGNOSIS — I480 Paroxysmal atrial fibrillation: Secondary | ICD-10-CM | POA: Diagnosis not present

## 2023-02-17 DIAGNOSIS — R531 Weakness: Secondary | ICD-10-CM | POA: Diagnosis not present

## 2023-02-17 DIAGNOSIS — E785 Hyperlipidemia, unspecified: Secondary | ICD-10-CM | POA: Diagnosis not present

## 2023-02-17 DIAGNOSIS — R52 Pain, unspecified: Secondary | ICD-10-CM | POA: Diagnosis not present

## 2023-02-17 DIAGNOSIS — Z8546 Personal history of malignant neoplasm of prostate: Secondary | ICD-10-CM | POA: Diagnosis not present

## 2023-02-17 DIAGNOSIS — I519 Heart disease, unspecified: Secondary | ICD-10-CM | POA: Diagnosis not present

## 2023-02-17 DIAGNOSIS — R42 Dizziness and giddiness: Secondary | ICD-10-CM | POA: Diagnosis not present

## 2023-02-17 DIAGNOSIS — R0602 Shortness of breath: Secondary | ICD-10-CM | POA: Diagnosis not present

## 2023-02-17 DIAGNOSIS — F419 Anxiety disorder, unspecified: Secondary | ICD-10-CM | POA: Diagnosis not present

## 2023-02-17 DIAGNOSIS — C3490 Malignant neoplasm of unspecified part of unspecified bronchus or lung: Secondary | ICD-10-CM | POA: Diagnosis not present

## 2023-02-18 DIAGNOSIS — I519 Heart disease, unspecified: Secondary | ICD-10-CM | POA: Diagnosis not present

## 2023-02-18 DIAGNOSIS — F419 Anxiety disorder, unspecified: Secondary | ICD-10-CM | POA: Diagnosis not present

## 2023-02-18 DIAGNOSIS — I10 Essential (primary) hypertension: Secondary | ICD-10-CM | POA: Diagnosis not present

## 2023-02-18 DIAGNOSIS — R52 Pain, unspecified: Secondary | ICD-10-CM | POA: Diagnosis not present

## 2023-02-18 DIAGNOSIS — I639 Cerebral infarction, unspecified: Secondary | ICD-10-CM | POA: Diagnosis not present

## 2023-02-18 DIAGNOSIS — I480 Paroxysmal atrial fibrillation: Secondary | ICD-10-CM | POA: Diagnosis not present

## 2023-02-18 DIAGNOSIS — R630 Anorexia: Secondary | ICD-10-CM | POA: Diagnosis not present

## 2023-02-18 DIAGNOSIS — K219 Gastro-esophageal reflux disease without esophagitis: Secondary | ICD-10-CM | POA: Diagnosis not present

## 2023-02-18 DIAGNOSIS — F32A Depression, unspecified: Secondary | ICD-10-CM | POA: Diagnosis not present

## 2023-02-18 DIAGNOSIS — Z8546 Personal history of malignant neoplasm of prostate: Secondary | ICD-10-CM | POA: Diagnosis not present

## 2023-02-18 DIAGNOSIS — C3490 Malignant neoplasm of unspecified part of unspecified bronchus or lung: Secondary | ICD-10-CM | POA: Diagnosis not present

## 2023-02-18 DIAGNOSIS — R0602 Shortness of breath: Secondary | ICD-10-CM | POA: Diagnosis not present

## 2023-02-18 DIAGNOSIS — R112 Nausea with vomiting, unspecified: Secondary | ICD-10-CM | POA: Diagnosis not present

## 2023-02-18 DIAGNOSIS — R531 Weakness: Secondary | ICD-10-CM | POA: Diagnosis not present

## 2023-02-18 DIAGNOSIS — R42 Dizziness and giddiness: Secondary | ICD-10-CM | POA: Diagnosis not present

## 2023-02-18 DIAGNOSIS — E785 Hyperlipidemia, unspecified: Secondary | ICD-10-CM | POA: Diagnosis not present

## 2023-02-19 DIAGNOSIS — R0602 Shortness of breath: Secondary | ICD-10-CM | POA: Diagnosis not present

## 2023-02-19 DIAGNOSIS — R531 Weakness: Secondary | ICD-10-CM | POA: Diagnosis not present

## 2023-02-19 DIAGNOSIS — F419 Anxiety disorder, unspecified: Secondary | ICD-10-CM | POA: Diagnosis not present

## 2023-02-19 DIAGNOSIS — R42 Dizziness and giddiness: Secondary | ICD-10-CM | POA: Diagnosis not present

## 2023-02-19 DIAGNOSIS — C3490 Malignant neoplasm of unspecified part of unspecified bronchus or lung: Secondary | ICD-10-CM | POA: Diagnosis not present

## 2023-02-19 DIAGNOSIS — F32A Depression, unspecified: Secondary | ICD-10-CM | POA: Diagnosis not present

## 2023-02-19 DIAGNOSIS — R52 Pain, unspecified: Secondary | ICD-10-CM | POA: Diagnosis not present

## 2023-02-19 DIAGNOSIS — I10 Essential (primary) hypertension: Secondary | ICD-10-CM | POA: Diagnosis not present

## 2023-02-19 DIAGNOSIS — Z8546 Personal history of malignant neoplasm of prostate: Secondary | ICD-10-CM | POA: Diagnosis not present

## 2023-02-19 DIAGNOSIS — I639 Cerebral infarction, unspecified: Secondary | ICD-10-CM | POA: Diagnosis not present

## 2023-02-19 DIAGNOSIS — R630 Anorexia: Secondary | ICD-10-CM | POA: Diagnosis not present

## 2023-02-19 DIAGNOSIS — E785 Hyperlipidemia, unspecified: Secondary | ICD-10-CM | POA: Diagnosis not present

## 2023-02-19 DIAGNOSIS — R112 Nausea with vomiting, unspecified: Secondary | ICD-10-CM | POA: Diagnosis not present

## 2023-02-19 DIAGNOSIS — K219 Gastro-esophageal reflux disease without esophagitis: Secondary | ICD-10-CM | POA: Diagnosis not present

## 2023-02-19 DIAGNOSIS — I480 Paroxysmal atrial fibrillation: Secondary | ICD-10-CM | POA: Diagnosis not present

## 2023-02-19 DIAGNOSIS — I519 Heart disease, unspecified: Secondary | ICD-10-CM | POA: Diagnosis not present

## 2023-02-20 DIAGNOSIS — E785 Hyperlipidemia, unspecified: Secondary | ICD-10-CM | POA: Diagnosis not present

## 2023-02-20 DIAGNOSIS — Z8546 Personal history of malignant neoplasm of prostate: Secondary | ICD-10-CM | POA: Diagnosis not present

## 2023-02-20 DIAGNOSIS — R531 Weakness: Secondary | ICD-10-CM | POA: Diagnosis not present

## 2023-02-20 DIAGNOSIS — I10 Essential (primary) hypertension: Secondary | ICD-10-CM | POA: Diagnosis not present

## 2023-02-20 DIAGNOSIS — R630 Anorexia: Secondary | ICD-10-CM | POA: Diagnosis not present

## 2023-02-20 DIAGNOSIS — C3490 Malignant neoplasm of unspecified part of unspecified bronchus or lung: Secondary | ICD-10-CM | POA: Diagnosis not present

## 2023-02-20 DIAGNOSIS — F32A Depression, unspecified: Secondary | ICD-10-CM | POA: Diagnosis not present

## 2023-02-20 DIAGNOSIS — I480 Paroxysmal atrial fibrillation: Secondary | ICD-10-CM | POA: Diagnosis not present

## 2023-02-20 DIAGNOSIS — R0602 Shortness of breath: Secondary | ICD-10-CM | POA: Diagnosis not present

## 2023-02-20 DIAGNOSIS — R112 Nausea with vomiting, unspecified: Secondary | ICD-10-CM | POA: Diagnosis not present

## 2023-02-20 DIAGNOSIS — R42 Dizziness and giddiness: Secondary | ICD-10-CM | POA: Diagnosis not present

## 2023-02-20 DIAGNOSIS — F419 Anxiety disorder, unspecified: Secondary | ICD-10-CM | POA: Diagnosis not present

## 2023-02-20 DIAGNOSIS — I519 Heart disease, unspecified: Secondary | ICD-10-CM | POA: Diagnosis not present

## 2023-02-20 DIAGNOSIS — I639 Cerebral infarction, unspecified: Secondary | ICD-10-CM | POA: Diagnosis not present

## 2023-02-20 DIAGNOSIS — K219 Gastro-esophageal reflux disease without esophagitis: Secondary | ICD-10-CM | POA: Diagnosis not present

## 2023-02-20 DIAGNOSIS — R52 Pain, unspecified: Secondary | ICD-10-CM | POA: Diagnosis not present

## 2023-02-21 DIAGNOSIS — I639 Cerebral infarction, unspecified: Secondary | ICD-10-CM | POA: Diagnosis not present

## 2023-02-21 DIAGNOSIS — C3490 Malignant neoplasm of unspecified part of unspecified bronchus or lung: Secondary | ICD-10-CM | POA: Diagnosis not present

## 2023-02-21 DIAGNOSIS — R42 Dizziness and giddiness: Secondary | ICD-10-CM | POA: Diagnosis not present

## 2023-02-21 DIAGNOSIS — K219 Gastro-esophageal reflux disease without esophagitis: Secondary | ICD-10-CM | POA: Diagnosis not present

## 2023-02-21 DIAGNOSIS — I480 Paroxysmal atrial fibrillation: Secondary | ICD-10-CM | POA: Diagnosis not present

## 2023-02-21 DIAGNOSIS — E785 Hyperlipidemia, unspecified: Secondary | ICD-10-CM | POA: Diagnosis not present

## 2023-02-21 DIAGNOSIS — R630 Anorexia: Secondary | ICD-10-CM | POA: Diagnosis not present

## 2023-02-21 DIAGNOSIS — Z8546 Personal history of malignant neoplasm of prostate: Secondary | ICD-10-CM | POA: Diagnosis not present

## 2023-02-21 DIAGNOSIS — R0602 Shortness of breath: Secondary | ICD-10-CM | POA: Diagnosis not present

## 2023-02-21 DIAGNOSIS — R52 Pain, unspecified: Secondary | ICD-10-CM | POA: Diagnosis not present

## 2023-02-21 DIAGNOSIS — F419 Anxiety disorder, unspecified: Secondary | ICD-10-CM | POA: Diagnosis not present

## 2023-02-21 DIAGNOSIS — R531 Weakness: Secondary | ICD-10-CM | POA: Diagnosis not present

## 2023-02-21 DIAGNOSIS — I10 Essential (primary) hypertension: Secondary | ICD-10-CM | POA: Diagnosis not present

## 2023-02-21 DIAGNOSIS — I519 Heart disease, unspecified: Secondary | ICD-10-CM | POA: Diagnosis not present

## 2023-02-21 DIAGNOSIS — R112 Nausea with vomiting, unspecified: Secondary | ICD-10-CM | POA: Diagnosis not present

## 2023-02-21 DIAGNOSIS — F32A Depression, unspecified: Secondary | ICD-10-CM | POA: Diagnosis not present

## 2023-02-22 DIAGNOSIS — R0602 Shortness of breath: Secondary | ICD-10-CM | POA: Diagnosis not present

## 2023-02-22 DIAGNOSIS — I10 Essential (primary) hypertension: Secondary | ICD-10-CM | POA: Diagnosis not present

## 2023-02-22 DIAGNOSIS — K219 Gastro-esophageal reflux disease without esophagitis: Secondary | ICD-10-CM | POA: Diagnosis not present

## 2023-02-22 DIAGNOSIS — C3490 Malignant neoplasm of unspecified part of unspecified bronchus or lung: Secondary | ICD-10-CM | POA: Diagnosis not present

## 2023-02-22 DIAGNOSIS — R112 Nausea with vomiting, unspecified: Secondary | ICD-10-CM | POA: Diagnosis not present

## 2023-02-22 DIAGNOSIS — E785 Hyperlipidemia, unspecified: Secondary | ICD-10-CM | POA: Diagnosis not present

## 2023-02-22 DIAGNOSIS — I480 Paroxysmal atrial fibrillation: Secondary | ICD-10-CM | POA: Diagnosis not present

## 2023-02-22 DIAGNOSIS — R42 Dizziness and giddiness: Secondary | ICD-10-CM | POA: Diagnosis not present

## 2023-02-22 DIAGNOSIS — R52 Pain, unspecified: Secondary | ICD-10-CM | POA: Diagnosis not present

## 2023-02-22 DIAGNOSIS — R531 Weakness: Secondary | ICD-10-CM | POA: Diagnosis not present

## 2023-02-22 DIAGNOSIS — I639 Cerebral infarction, unspecified: Secondary | ICD-10-CM | POA: Diagnosis not present

## 2023-02-22 DIAGNOSIS — R630 Anorexia: Secondary | ICD-10-CM | POA: Diagnosis not present

## 2023-02-22 DIAGNOSIS — I519 Heart disease, unspecified: Secondary | ICD-10-CM | POA: Diagnosis not present

## 2023-02-22 DIAGNOSIS — F32A Depression, unspecified: Secondary | ICD-10-CM | POA: Diagnosis not present

## 2023-02-22 DIAGNOSIS — Z8546 Personal history of malignant neoplasm of prostate: Secondary | ICD-10-CM | POA: Diagnosis not present

## 2023-02-22 DIAGNOSIS — F419 Anxiety disorder, unspecified: Secondary | ICD-10-CM | POA: Diagnosis not present

## 2023-02-23 DIAGNOSIS — R0602 Shortness of breath: Secondary | ICD-10-CM | POA: Diagnosis not present

## 2023-02-23 DIAGNOSIS — K219 Gastro-esophageal reflux disease without esophagitis: Secondary | ICD-10-CM | POA: Diagnosis not present

## 2023-02-23 DIAGNOSIS — C3490 Malignant neoplasm of unspecified part of unspecified bronchus or lung: Secondary | ICD-10-CM | POA: Diagnosis not present

## 2023-02-23 DIAGNOSIS — I480 Paroxysmal atrial fibrillation: Secondary | ICD-10-CM | POA: Diagnosis not present

## 2023-02-23 DIAGNOSIS — Z8546 Personal history of malignant neoplasm of prostate: Secondary | ICD-10-CM | POA: Diagnosis not present

## 2023-02-23 DIAGNOSIS — F419 Anxiety disorder, unspecified: Secondary | ICD-10-CM | POA: Diagnosis not present

## 2023-02-23 DIAGNOSIS — I519 Heart disease, unspecified: Secondary | ICD-10-CM | POA: Diagnosis not present

## 2023-02-23 DIAGNOSIS — R112 Nausea with vomiting, unspecified: Secondary | ICD-10-CM | POA: Diagnosis not present

## 2023-02-23 DIAGNOSIS — R42 Dizziness and giddiness: Secondary | ICD-10-CM | POA: Diagnosis not present

## 2023-02-23 DIAGNOSIS — R52 Pain, unspecified: Secondary | ICD-10-CM | POA: Diagnosis not present

## 2023-02-23 DIAGNOSIS — E785 Hyperlipidemia, unspecified: Secondary | ICD-10-CM | POA: Diagnosis not present

## 2023-02-23 DIAGNOSIS — I10 Essential (primary) hypertension: Secondary | ICD-10-CM | POA: Diagnosis not present

## 2023-02-23 DIAGNOSIS — I639 Cerebral infarction, unspecified: Secondary | ICD-10-CM | POA: Diagnosis not present

## 2023-02-23 DIAGNOSIS — F32A Depression, unspecified: Secondary | ICD-10-CM | POA: Diagnosis not present

## 2023-02-23 DIAGNOSIS — R630 Anorexia: Secondary | ICD-10-CM | POA: Diagnosis not present

## 2023-02-23 DIAGNOSIS — R531 Weakness: Secondary | ICD-10-CM | POA: Diagnosis not present

## 2023-02-24 DIAGNOSIS — R42 Dizziness and giddiness: Secondary | ICD-10-CM | POA: Diagnosis not present

## 2023-02-24 DIAGNOSIS — R0602 Shortness of breath: Secondary | ICD-10-CM | POA: Diagnosis not present

## 2023-02-24 DIAGNOSIS — I519 Heart disease, unspecified: Secondary | ICD-10-CM | POA: Diagnosis not present

## 2023-02-24 DIAGNOSIS — I639 Cerebral infarction, unspecified: Secondary | ICD-10-CM | POA: Diagnosis not present

## 2023-02-24 DIAGNOSIS — C3490 Malignant neoplasm of unspecified part of unspecified bronchus or lung: Secondary | ICD-10-CM | POA: Diagnosis not present

## 2023-02-24 DIAGNOSIS — F419 Anxiety disorder, unspecified: Secondary | ICD-10-CM | POA: Diagnosis not present

## 2023-02-24 DIAGNOSIS — I10 Essential (primary) hypertension: Secondary | ICD-10-CM | POA: Diagnosis not present

## 2023-02-24 DIAGNOSIS — Z8546 Personal history of malignant neoplasm of prostate: Secondary | ICD-10-CM | POA: Diagnosis not present

## 2023-02-24 DIAGNOSIS — R52 Pain, unspecified: Secondary | ICD-10-CM | POA: Diagnosis not present

## 2023-02-24 DIAGNOSIS — K219 Gastro-esophageal reflux disease without esophagitis: Secondary | ICD-10-CM | POA: Diagnosis not present

## 2023-02-24 DIAGNOSIS — I480 Paroxysmal atrial fibrillation: Secondary | ICD-10-CM | POA: Diagnosis not present

## 2023-02-24 DIAGNOSIS — R112 Nausea with vomiting, unspecified: Secondary | ICD-10-CM | POA: Diagnosis not present

## 2023-02-24 DIAGNOSIS — R531 Weakness: Secondary | ICD-10-CM | POA: Diagnosis not present

## 2023-02-24 DIAGNOSIS — E785 Hyperlipidemia, unspecified: Secondary | ICD-10-CM | POA: Diagnosis not present

## 2023-02-24 DIAGNOSIS — R630 Anorexia: Secondary | ICD-10-CM | POA: Diagnosis not present

## 2023-02-24 DIAGNOSIS — F32A Depression, unspecified: Secondary | ICD-10-CM | POA: Diagnosis not present

## 2023-02-25 DIAGNOSIS — R42 Dizziness and giddiness: Secondary | ICD-10-CM | POA: Diagnosis not present

## 2023-02-25 DIAGNOSIS — I519 Heart disease, unspecified: Secondary | ICD-10-CM | POA: Diagnosis not present

## 2023-02-25 DIAGNOSIS — E785 Hyperlipidemia, unspecified: Secondary | ICD-10-CM | POA: Diagnosis not present

## 2023-02-25 DIAGNOSIS — I10 Essential (primary) hypertension: Secondary | ICD-10-CM | POA: Diagnosis not present

## 2023-02-25 DIAGNOSIS — F419 Anxiety disorder, unspecified: Secondary | ICD-10-CM | POA: Diagnosis not present

## 2023-02-25 DIAGNOSIS — I480 Paroxysmal atrial fibrillation: Secondary | ICD-10-CM | POA: Diagnosis not present

## 2023-02-25 DIAGNOSIS — C3490 Malignant neoplasm of unspecified part of unspecified bronchus or lung: Secondary | ICD-10-CM | POA: Diagnosis not present

## 2023-02-25 DIAGNOSIS — R630 Anorexia: Secondary | ICD-10-CM | POA: Diagnosis not present

## 2023-02-25 DIAGNOSIS — R0602 Shortness of breath: Secondary | ICD-10-CM | POA: Diagnosis not present

## 2023-02-25 DIAGNOSIS — I639 Cerebral infarction, unspecified: Secondary | ICD-10-CM | POA: Diagnosis not present

## 2023-02-25 DIAGNOSIS — K219 Gastro-esophageal reflux disease without esophagitis: Secondary | ICD-10-CM | POA: Diagnosis not present

## 2023-02-25 DIAGNOSIS — R52 Pain, unspecified: Secondary | ICD-10-CM | POA: Diagnosis not present

## 2023-02-25 DIAGNOSIS — R531 Weakness: Secondary | ICD-10-CM | POA: Diagnosis not present

## 2023-02-25 DIAGNOSIS — Z8546 Personal history of malignant neoplasm of prostate: Secondary | ICD-10-CM | POA: Diagnosis not present

## 2023-02-25 DIAGNOSIS — R112 Nausea with vomiting, unspecified: Secondary | ICD-10-CM | POA: Diagnosis not present

## 2023-02-25 DIAGNOSIS — F32A Depression, unspecified: Secondary | ICD-10-CM | POA: Diagnosis not present

## 2023-02-26 DIAGNOSIS — I639 Cerebral infarction, unspecified: Secondary | ICD-10-CM | POA: Diagnosis not present

## 2023-02-26 DIAGNOSIS — F419 Anxiety disorder, unspecified: Secondary | ICD-10-CM | POA: Diagnosis not present

## 2023-02-26 DIAGNOSIS — Z8546 Personal history of malignant neoplasm of prostate: Secondary | ICD-10-CM | POA: Diagnosis not present

## 2023-02-26 DIAGNOSIS — R112 Nausea with vomiting, unspecified: Secondary | ICD-10-CM | POA: Diagnosis not present

## 2023-02-26 DIAGNOSIS — I10 Essential (primary) hypertension: Secondary | ICD-10-CM | POA: Diagnosis not present

## 2023-02-26 DIAGNOSIS — C3490 Malignant neoplasm of unspecified part of unspecified bronchus or lung: Secondary | ICD-10-CM | POA: Diagnosis not present

## 2023-02-26 DIAGNOSIS — K219 Gastro-esophageal reflux disease without esophagitis: Secondary | ICD-10-CM | POA: Diagnosis not present

## 2023-02-26 DIAGNOSIS — E785 Hyperlipidemia, unspecified: Secondary | ICD-10-CM | POA: Diagnosis not present

## 2023-02-26 DIAGNOSIS — R52 Pain, unspecified: Secondary | ICD-10-CM | POA: Diagnosis not present

## 2023-02-26 DIAGNOSIS — I480 Paroxysmal atrial fibrillation: Secondary | ICD-10-CM | POA: Diagnosis not present

## 2023-02-26 DIAGNOSIS — I519 Heart disease, unspecified: Secondary | ICD-10-CM | POA: Diagnosis not present

## 2023-02-26 DIAGNOSIS — R531 Weakness: Secondary | ICD-10-CM | POA: Diagnosis not present

## 2023-02-26 DIAGNOSIS — R42 Dizziness and giddiness: Secondary | ICD-10-CM | POA: Diagnosis not present

## 2023-02-26 DIAGNOSIS — F32A Depression, unspecified: Secondary | ICD-10-CM | POA: Diagnosis not present

## 2023-02-26 DIAGNOSIS — R630 Anorexia: Secondary | ICD-10-CM | POA: Diagnosis not present

## 2023-02-26 DIAGNOSIS — R0602 Shortness of breath: Secondary | ICD-10-CM | POA: Diagnosis not present

## 2023-02-27 DIAGNOSIS — R112 Nausea with vomiting, unspecified: Secondary | ICD-10-CM | POA: Diagnosis not present

## 2023-02-27 DIAGNOSIS — I480 Paroxysmal atrial fibrillation: Secondary | ICD-10-CM | POA: Diagnosis not present

## 2023-02-27 DIAGNOSIS — R52 Pain, unspecified: Secondary | ICD-10-CM | POA: Diagnosis not present

## 2023-02-27 DIAGNOSIS — R531 Weakness: Secondary | ICD-10-CM | POA: Diagnosis not present

## 2023-02-27 DIAGNOSIS — I639 Cerebral infarction, unspecified: Secondary | ICD-10-CM | POA: Diagnosis not present

## 2023-02-27 DIAGNOSIS — I10 Essential (primary) hypertension: Secondary | ICD-10-CM | POA: Diagnosis not present

## 2023-02-27 DIAGNOSIS — I519 Heart disease, unspecified: Secondary | ICD-10-CM | POA: Diagnosis not present

## 2023-02-27 DIAGNOSIS — C3490 Malignant neoplasm of unspecified part of unspecified bronchus or lung: Secondary | ICD-10-CM | POA: Diagnosis not present

## 2023-02-27 DIAGNOSIS — K219 Gastro-esophageal reflux disease without esophagitis: Secondary | ICD-10-CM | POA: Diagnosis not present

## 2023-02-27 DIAGNOSIS — R42 Dizziness and giddiness: Secondary | ICD-10-CM | POA: Diagnosis not present

## 2023-02-27 DIAGNOSIS — R0602 Shortness of breath: Secondary | ICD-10-CM | POA: Diagnosis not present

## 2023-02-27 DIAGNOSIS — Z8546 Personal history of malignant neoplasm of prostate: Secondary | ICD-10-CM | POA: Diagnosis not present

## 2023-02-27 DIAGNOSIS — F419 Anxiety disorder, unspecified: Secondary | ICD-10-CM | POA: Diagnosis not present

## 2023-02-27 DIAGNOSIS — F32A Depression, unspecified: Secondary | ICD-10-CM | POA: Diagnosis not present

## 2023-02-27 DIAGNOSIS — E785 Hyperlipidemia, unspecified: Secondary | ICD-10-CM | POA: Diagnosis not present

## 2023-02-27 DIAGNOSIS — R630 Anorexia: Secondary | ICD-10-CM | POA: Diagnosis not present

## 2023-02-28 DIAGNOSIS — F32A Depression, unspecified: Secondary | ICD-10-CM | POA: Diagnosis not present

## 2023-02-28 DIAGNOSIS — C3490 Malignant neoplasm of unspecified part of unspecified bronchus or lung: Secondary | ICD-10-CM | POA: Diagnosis not present

## 2023-02-28 DIAGNOSIS — R52 Pain, unspecified: Secondary | ICD-10-CM | POA: Diagnosis not present

## 2023-02-28 DIAGNOSIS — I10 Essential (primary) hypertension: Secondary | ICD-10-CM | POA: Diagnosis not present

## 2023-02-28 DIAGNOSIS — R531 Weakness: Secondary | ICD-10-CM | POA: Diagnosis not present

## 2023-02-28 DIAGNOSIS — I519 Heart disease, unspecified: Secondary | ICD-10-CM | POA: Diagnosis not present

## 2023-02-28 DIAGNOSIS — R112 Nausea with vomiting, unspecified: Secondary | ICD-10-CM | POA: Diagnosis not present

## 2023-02-28 DIAGNOSIS — K219 Gastro-esophageal reflux disease without esophagitis: Secondary | ICD-10-CM | POA: Diagnosis not present

## 2023-02-28 DIAGNOSIS — I639 Cerebral infarction, unspecified: Secondary | ICD-10-CM | POA: Diagnosis not present

## 2023-02-28 DIAGNOSIS — F419 Anxiety disorder, unspecified: Secondary | ICD-10-CM | POA: Diagnosis not present

## 2023-02-28 DIAGNOSIS — I480 Paroxysmal atrial fibrillation: Secondary | ICD-10-CM | POA: Diagnosis not present

## 2023-02-28 DIAGNOSIS — R0602 Shortness of breath: Secondary | ICD-10-CM | POA: Diagnosis not present

## 2023-02-28 DIAGNOSIS — R42 Dizziness and giddiness: Secondary | ICD-10-CM | POA: Diagnosis not present

## 2023-02-28 DIAGNOSIS — Z8546 Personal history of malignant neoplasm of prostate: Secondary | ICD-10-CM | POA: Diagnosis not present

## 2023-02-28 DIAGNOSIS — R630 Anorexia: Secondary | ICD-10-CM | POA: Diagnosis not present

## 2023-02-28 DIAGNOSIS — E785 Hyperlipidemia, unspecified: Secondary | ICD-10-CM | POA: Diagnosis not present

## 2023-03-01 DIAGNOSIS — Z8546 Personal history of malignant neoplasm of prostate: Secondary | ICD-10-CM | POA: Diagnosis not present

## 2023-03-01 DIAGNOSIS — F419 Anxiety disorder, unspecified: Secondary | ICD-10-CM | POA: Diagnosis not present

## 2023-03-01 DIAGNOSIS — R0602 Shortness of breath: Secondary | ICD-10-CM | POA: Diagnosis not present

## 2023-03-01 DIAGNOSIS — F32A Depression, unspecified: Secondary | ICD-10-CM | POA: Diagnosis not present

## 2023-03-01 DIAGNOSIS — I639 Cerebral infarction, unspecified: Secondary | ICD-10-CM | POA: Diagnosis not present

## 2023-03-01 DIAGNOSIS — E785 Hyperlipidemia, unspecified: Secondary | ICD-10-CM | POA: Diagnosis not present

## 2023-03-01 DIAGNOSIS — I480 Paroxysmal atrial fibrillation: Secondary | ICD-10-CM | POA: Diagnosis not present

## 2023-03-01 DIAGNOSIS — R112 Nausea with vomiting, unspecified: Secondary | ICD-10-CM | POA: Diagnosis not present

## 2023-03-01 DIAGNOSIS — I519 Heart disease, unspecified: Secondary | ICD-10-CM | POA: Diagnosis not present

## 2023-03-01 DIAGNOSIS — R52 Pain, unspecified: Secondary | ICD-10-CM | POA: Diagnosis not present

## 2023-03-01 DIAGNOSIS — I10 Essential (primary) hypertension: Secondary | ICD-10-CM | POA: Diagnosis not present

## 2023-03-01 DIAGNOSIS — R42 Dizziness and giddiness: Secondary | ICD-10-CM | POA: Diagnosis not present

## 2023-03-01 DIAGNOSIS — K219 Gastro-esophageal reflux disease without esophagitis: Secondary | ICD-10-CM | POA: Diagnosis not present

## 2023-03-01 DIAGNOSIS — R630 Anorexia: Secondary | ICD-10-CM | POA: Diagnosis not present

## 2023-03-01 DIAGNOSIS — C3490 Malignant neoplasm of unspecified part of unspecified bronchus or lung: Secondary | ICD-10-CM | POA: Diagnosis not present

## 2023-03-01 DIAGNOSIS — R531 Weakness: Secondary | ICD-10-CM | POA: Diagnosis not present

## 2023-03-02 DIAGNOSIS — E785 Hyperlipidemia, unspecified: Secondary | ICD-10-CM | POA: Diagnosis not present

## 2023-03-02 DIAGNOSIS — R0602 Shortness of breath: Secondary | ICD-10-CM | POA: Diagnosis not present

## 2023-03-02 DIAGNOSIS — Z8546 Personal history of malignant neoplasm of prostate: Secondary | ICD-10-CM | POA: Diagnosis not present

## 2023-03-02 DIAGNOSIS — I639 Cerebral infarction, unspecified: Secondary | ICD-10-CM | POA: Diagnosis not present

## 2023-03-02 DIAGNOSIS — I10 Essential (primary) hypertension: Secondary | ICD-10-CM | POA: Diagnosis not present

## 2023-03-02 DIAGNOSIS — R52 Pain, unspecified: Secondary | ICD-10-CM | POA: Diagnosis not present

## 2023-03-02 DIAGNOSIS — F32A Depression, unspecified: Secondary | ICD-10-CM | POA: Diagnosis not present

## 2023-03-02 DIAGNOSIS — F419 Anxiety disorder, unspecified: Secondary | ICD-10-CM | POA: Diagnosis not present

## 2023-03-02 DIAGNOSIS — I480 Paroxysmal atrial fibrillation: Secondary | ICD-10-CM | POA: Diagnosis not present

## 2023-03-02 DIAGNOSIS — R630 Anorexia: Secondary | ICD-10-CM | POA: Diagnosis not present

## 2023-03-02 DIAGNOSIS — R531 Weakness: Secondary | ICD-10-CM | POA: Diagnosis not present

## 2023-03-02 DIAGNOSIS — R112 Nausea with vomiting, unspecified: Secondary | ICD-10-CM | POA: Diagnosis not present

## 2023-03-02 DIAGNOSIS — R42 Dizziness and giddiness: Secondary | ICD-10-CM | POA: Diagnosis not present

## 2023-03-02 DIAGNOSIS — C3490 Malignant neoplasm of unspecified part of unspecified bronchus or lung: Secondary | ICD-10-CM | POA: Diagnosis not present

## 2023-03-02 DIAGNOSIS — K219 Gastro-esophageal reflux disease without esophagitis: Secondary | ICD-10-CM | POA: Diagnosis not present

## 2023-03-02 DIAGNOSIS — I519 Heart disease, unspecified: Secondary | ICD-10-CM | POA: Diagnosis not present

## 2023-03-03 DIAGNOSIS — F32A Depression, unspecified: Secondary | ICD-10-CM | POA: Diagnosis not present

## 2023-03-03 DIAGNOSIS — R52 Pain, unspecified: Secondary | ICD-10-CM | POA: Diagnosis not present

## 2023-03-03 DIAGNOSIS — R42 Dizziness and giddiness: Secondary | ICD-10-CM | POA: Diagnosis not present

## 2023-03-03 DIAGNOSIS — K219 Gastro-esophageal reflux disease without esophagitis: Secondary | ICD-10-CM | POA: Diagnosis not present

## 2023-03-03 DIAGNOSIS — I519 Heart disease, unspecified: Secondary | ICD-10-CM | POA: Diagnosis not present

## 2023-03-03 DIAGNOSIS — F419 Anxiety disorder, unspecified: Secondary | ICD-10-CM | POA: Diagnosis not present

## 2023-03-03 DIAGNOSIS — R112 Nausea with vomiting, unspecified: Secondary | ICD-10-CM | POA: Diagnosis not present

## 2023-03-03 DIAGNOSIS — R531 Weakness: Secondary | ICD-10-CM | POA: Diagnosis not present

## 2023-03-03 DIAGNOSIS — C3490 Malignant neoplasm of unspecified part of unspecified bronchus or lung: Secondary | ICD-10-CM | POA: Diagnosis not present

## 2023-03-03 DIAGNOSIS — R630 Anorexia: Secondary | ICD-10-CM | POA: Diagnosis not present

## 2023-03-03 DIAGNOSIS — R0602 Shortness of breath: Secondary | ICD-10-CM | POA: Diagnosis not present

## 2023-03-03 DIAGNOSIS — Z8546 Personal history of malignant neoplasm of prostate: Secondary | ICD-10-CM | POA: Diagnosis not present

## 2023-03-03 DIAGNOSIS — I639 Cerebral infarction, unspecified: Secondary | ICD-10-CM | POA: Diagnosis not present

## 2023-03-03 DIAGNOSIS — I480 Paroxysmal atrial fibrillation: Secondary | ICD-10-CM | POA: Diagnosis not present

## 2023-03-03 DIAGNOSIS — E785 Hyperlipidemia, unspecified: Secondary | ICD-10-CM | POA: Diagnosis not present

## 2023-03-03 DIAGNOSIS — I10 Essential (primary) hypertension: Secondary | ICD-10-CM | POA: Diagnosis not present

## 2023-03-04 DIAGNOSIS — C3490 Malignant neoplasm of unspecified part of unspecified bronchus or lung: Secondary | ICD-10-CM | POA: Diagnosis not present

## 2023-03-04 DIAGNOSIS — I639 Cerebral infarction, unspecified: Secondary | ICD-10-CM | POA: Diagnosis not present

## 2023-03-04 DIAGNOSIS — I519 Heart disease, unspecified: Secondary | ICD-10-CM | POA: Diagnosis not present

## 2023-03-04 DIAGNOSIS — I10 Essential (primary) hypertension: Secondary | ICD-10-CM | POA: Diagnosis not present

## 2023-03-04 DIAGNOSIS — F32A Depression, unspecified: Secondary | ICD-10-CM | POA: Diagnosis not present

## 2023-03-04 DIAGNOSIS — Z8546 Personal history of malignant neoplasm of prostate: Secondary | ICD-10-CM | POA: Diagnosis not present

## 2023-03-04 DIAGNOSIS — R0602 Shortness of breath: Secondary | ICD-10-CM | POA: Diagnosis not present

## 2023-03-04 DIAGNOSIS — E785 Hyperlipidemia, unspecified: Secondary | ICD-10-CM | POA: Diagnosis not present

## 2023-03-04 DIAGNOSIS — R52 Pain, unspecified: Secondary | ICD-10-CM | POA: Diagnosis not present

## 2023-03-04 DIAGNOSIS — I480 Paroxysmal atrial fibrillation: Secondary | ICD-10-CM | POA: Diagnosis not present

## 2023-03-04 DIAGNOSIS — R531 Weakness: Secondary | ICD-10-CM | POA: Diagnosis not present

## 2023-03-04 DIAGNOSIS — R112 Nausea with vomiting, unspecified: Secondary | ICD-10-CM | POA: Diagnosis not present

## 2023-03-04 DIAGNOSIS — R630 Anorexia: Secondary | ICD-10-CM | POA: Diagnosis not present

## 2023-03-04 DIAGNOSIS — R42 Dizziness and giddiness: Secondary | ICD-10-CM | POA: Diagnosis not present

## 2023-03-04 DIAGNOSIS — K219 Gastro-esophageal reflux disease without esophagitis: Secondary | ICD-10-CM | POA: Diagnosis not present

## 2023-03-04 DIAGNOSIS — F419 Anxiety disorder, unspecified: Secondary | ICD-10-CM | POA: Diagnosis not present

## 2023-03-05 DIAGNOSIS — R630 Anorexia: Secondary | ICD-10-CM | POA: Diagnosis not present

## 2023-03-05 DIAGNOSIS — C3490 Malignant neoplasm of unspecified part of unspecified bronchus or lung: Secondary | ICD-10-CM | POA: Diagnosis not present

## 2023-03-05 DIAGNOSIS — K219 Gastro-esophageal reflux disease without esophagitis: Secondary | ICD-10-CM | POA: Diagnosis not present

## 2023-03-05 DIAGNOSIS — F32A Depression, unspecified: Secondary | ICD-10-CM | POA: Diagnosis not present

## 2023-03-05 DIAGNOSIS — I480 Paroxysmal atrial fibrillation: Secondary | ICD-10-CM | POA: Diagnosis not present

## 2023-03-05 DIAGNOSIS — R531 Weakness: Secondary | ICD-10-CM | POA: Diagnosis not present

## 2023-03-05 DIAGNOSIS — R0602 Shortness of breath: Secondary | ICD-10-CM | POA: Diagnosis not present

## 2023-03-05 DIAGNOSIS — Z8546 Personal history of malignant neoplasm of prostate: Secondary | ICD-10-CM | POA: Diagnosis not present

## 2023-03-05 DIAGNOSIS — I10 Essential (primary) hypertension: Secondary | ICD-10-CM | POA: Diagnosis not present

## 2023-03-05 DIAGNOSIS — I519 Heart disease, unspecified: Secondary | ICD-10-CM | POA: Diagnosis not present

## 2023-03-05 DIAGNOSIS — R52 Pain, unspecified: Secondary | ICD-10-CM | POA: Diagnosis not present

## 2023-03-05 DIAGNOSIS — I639 Cerebral infarction, unspecified: Secondary | ICD-10-CM | POA: Diagnosis not present

## 2023-03-05 DIAGNOSIS — R112 Nausea with vomiting, unspecified: Secondary | ICD-10-CM | POA: Diagnosis not present

## 2023-03-05 DIAGNOSIS — R42 Dizziness and giddiness: Secondary | ICD-10-CM | POA: Diagnosis not present

## 2023-03-05 DIAGNOSIS — F419 Anxiety disorder, unspecified: Secondary | ICD-10-CM | POA: Diagnosis not present

## 2023-03-05 DIAGNOSIS — E785 Hyperlipidemia, unspecified: Secondary | ICD-10-CM | POA: Diagnosis not present

## 2023-03-06 DIAGNOSIS — R630 Anorexia: Secondary | ICD-10-CM | POA: Diagnosis not present

## 2023-03-06 DIAGNOSIS — K219 Gastro-esophageal reflux disease without esophagitis: Secondary | ICD-10-CM | POA: Diagnosis not present

## 2023-03-06 DIAGNOSIS — I639 Cerebral infarction, unspecified: Secondary | ICD-10-CM | POA: Diagnosis not present

## 2023-03-06 DIAGNOSIS — R531 Weakness: Secondary | ICD-10-CM | POA: Diagnosis not present

## 2023-03-06 DIAGNOSIS — R42 Dizziness and giddiness: Secondary | ICD-10-CM | POA: Diagnosis not present

## 2023-03-06 DIAGNOSIS — R52 Pain, unspecified: Secondary | ICD-10-CM | POA: Diagnosis not present

## 2023-03-06 DIAGNOSIS — I519 Heart disease, unspecified: Secondary | ICD-10-CM | POA: Diagnosis not present

## 2023-03-06 DIAGNOSIS — Z8546 Personal history of malignant neoplasm of prostate: Secondary | ICD-10-CM | POA: Diagnosis not present

## 2023-03-06 DIAGNOSIS — C3490 Malignant neoplasm of unspecified part of unspecified bronchus or lung: Secondary | ICD-10-CM | POA: Diagnosis not present

## 2023-03-06 DIAGNOSIS — I480 Paroxysmal atrial fibrillation: Secondary | ICD-10-CM | POA: Diagnosis not present

## 2023-03-06 DIAGNOSIS — R0602 Shortness of breath: Secondary | ICD-10-CM | POA: Diagnosis not present

## 2023-03-06 DIAGNOSIS — F32A Depression, unspecified: Secondary | ICD-10-CM | POA: Diagnosis not present

## 2023-03-06 DIAGNOSIS — R112 Nausea with vomiting, unspecified: Secondary | ICD-10-CM | POA: Diagnosis not present

## 2023-03-06 DIAGNOSIS — F419 Anxiety disorder, unspecified: Secondary | ICD-10-CM | POA: Diagnosis not present

## 2023-03-06 DIAGNOSIS — E785 Hyperlipidemia, unspecified: Secondary | ICD-10-CM | POA: Diagnosis not present

## 2023-03-06 DIAGNOSIS — I10 Essential (primary) hypertension: Secondary | ICD-10-CM | POA: Diagnosis not present

## 2023-03-07 DIAGNOSIS — C3490 Malignant neoplasm of unspecified part of unspecified bronchus or lung: Secondary | ICD-10-CM | POA: Diagnosis not present

## 2023-03-07 DIAGNOSIS — R112 Nausea with vomiting, unspecified: Secondary | ICD-10-CM | POA: Diagnosis not present

## 2023-03-07 DIAGNOSIS — I10 Essential (primary) hypertension: Secondary | ICD-10-CM | POA: Diagnosis not present

## 2023-03-07 DIAGNOSIS — R630 Anorexia: Secondary | ICD-10-CM | POA: Diagnosis not present

## 2023-03-07 DIAGNOSIS — I639 Cerebral infarction, unspecified: Secondary | ICD-10-CM | POA: Diagnosis not present

## 2023-03-07 DIAGNOSIS — Z8546 Personal history of malignant neoplasm of prostate: Secondary | ICD-10-CM | POA: Diagnosis not present

## 2023-03-07 DIAGNOSIS — F419 Anxiety disorder, unspecified: Secondary | ICD-10-CM | POA: Diagnosis not present

## 2023-03-07 DIAGNOSIS — I480 Paroxysmal atrial fibrillation: Secondary | ICD-10-CM | POA: Diagnosis not present

## 2023-03-07 DIAGNOSIS — R531 Weakness: Secondary | ICD-10-CM | POA: Diagnosis not present

## 2023-03-07 DIAGNOSIS — E785 Hyperlipidemia, unspecified: Secondary | ICD-10-CM | POA: Diagnosis not present

## 2023-03-07 DIAGNOSIS — R0602 Shortness of breath: Secondary | ICD-10-CM | POA: Diagnosis not present

## 2023-03-07 DIAGNOSIS — R42 Dizziness and giddiness: Secondary | ICD-10-CM | POA: Diagnosis not present

## 2023-03-07 DIAGNOSIS — F32A Depression, unspecified: Secondary | ICD-10-CM | POA: Diagnosis not present

## 2023-03-07 DIAGNOSIS — K219 Gastro-esophageal reflux disease without esophagitis: Secondary | ICD-10-CM | POA: Diagnosis not present

## 2023-03-07 DIAGNOSIS — R52 Pain, unspecified: Secondary | ICD-10-CM | POA: Diagnosis not present

## 2023-03-07 DIAGNOSIS — I519 Heart disease, unspecified: Secondary | ICD-10-CM | POA: Diagnosis not present

## 2023-03-08 DIAGNOSIS — R42 Dizziness and giddiness: Secondary | ICD-10-CM | POA: Diagnosis not present

## 2023-03-08 DIAGNOSIS — R112 Nausea with vomiting, unspecified: Secondary | ICD-10-CM | POA: Diagnosis not present

## 2023-03-08 DIAGNOSIS — F32A Depression, unspecified: Secondary | ICD-10-CM | POA: Diagnosis not present

## 2023-03-08 DIAGNOSIS — I480 Paroxysmal atrial fibrillation: Secondary | ICD-10-CM | POA: Diagnosis not present

## 2023-03-08 DIAGNOSIS — I10 Essential (primary) hypertension: Secondary | ICD-10-CM | POA: Diagnosis not present

## 2023-03-08 DIAGNOSIS — F419 Anxiety disorder, unspecified: Secondary | ICD-10-CM | POA: Diagnosis not present

## 2023-03-08 DIAGNOSIS — Z8546 Personal history of malignant neoplasm of prostate: Secondary | ICD-10-CM | POA: Diagnosis not present

## 2023-03-08 DIAGNOSIS — R0602 Shortness of breath: Secondary | ICD-10-CM | POA: Diagnosis not present

## 2023-03-08 DIAGNOSIS — R630 Anorexia: Secondary | ICD-10-CM | POA: Diagnosis not present

## 2023-03-08 DIAGNOSIS — R52 Pain, unspecified: Secondary | ICD-10-CM | POA: Diagnosis not present

## 2023-03-08 DIAGNOSIS — C3490 Malignant neoplasm of unspecified part of unspecified bronchus or lung: Secondary | ICD-10-CM | POA: Diagnosis not present

## 2023-03-08 DIAGNOSIS — E785 Hyperlipidemia, unspecified: Secondary | ICD-10-CM | POA: Diagnosis not present

## 2023-03-08 DIAGNOSIS — I639 Cerebral infarction, unspecified: Secondary | ICD-10-CM | POA: Diagnosis not present

## 2023-03-08 DIAGNOSIS — R531 Weakness: Secondary | ICD-10-CM | POA: Diagnosis not present

## 2023-03-08 DIAGNOSIS — I519 Heart disease, unspecified: Secondary | ICD-10-CM | POA: Diagnosis not present

## 2023-03-08 DIAGNOSIS — K219 Gastro-esophageal reflux disease without esophagitis: Secondary | ICD-10-CM | POA: Diagnosis not present

## 2023-03-09 DIAGNOSIS — R42 Dizziness and giddiness: Secondary | ICD-10-CM | POA: Diagnosis not present

## 2023-03-09 DIAGNOSIS — R52 Pain, unspecified: Secondary | ICD-10-CM | POA: Diagnosis not present

## 2023-03-09 DIAGNOSIS — K219 Gastro-esophageal reflux disease without esophagitis: Secondary | ICD-10-CM | POA: Diagnosis not present

## 2023-03-09 DIAGNOSIS — F32A Depression, unspecified: Secondary | ICD-10-CM | POA: Diagnosis not present

## 2023-03-09 DIAGNOSIS — R630 Anorexia: Secondary | ICD-10-CM | POA: Diagnosis not present

## 2023-03-09 DIAGNOSIS — R531 Weakness: Secondary | ICD-10-CM | POA: Diagnosis not present

## 2023-03-09 DIAGNOSIS — E785 Hyperlipidemia, unspecified: Secondary | ICD-10-CM | POA: Diagnosis not present

## 2023-03-09 DIAGNOSIS — I639 Cerebral infarction, unspecified: Secondary | ICD-10-CM | POA: Diagnosis not present

## 2023-03-09 DIAGNOSIS — R112 Nausea with vomiting, unspecified: Secondary | ICD-10-CM | POA: Diagnosis not present

## 2023-03-09 DIAGNOSIS — I519 Heart disease, unspecified: Secondary | ICD-10-CM | POA: Diagnosis not present

## 2023-03-09 DIAGNOSIS — C3490 Malignant neoplasm of unspecified part of unspecified bronchus or lung: Secondary | ICD-10-CM | POA: Diagnosis not present

## 2023-03-09 DIAGNOSIS — F419 Anxiety disorder, unspecified: Secondary | ICD-10-CM | POA: Diagnosis not present

## 2023-03-09 DIAGNOSIS — Z8546 Personal history of malignant neoplasm of prostate: Secondary | ICD-10-CM | POA: Diagnosis not present

## 2023-03-09 DIAGNOSIS — R0602 Shortness of breath: Secondary | ICD-10-CM | POA: Diagnosis not present

## 2023-03-09 DIAGNOSIS — I480 Paroxysmal atrial fibrillation: Secondary | ICD-10-CM | POA: Diagnosis not present

## 2023-03-09 DIAGNOSIS — I10 Essential (primary) hypertension: Secondary | ICD-10-CM | POA: Diagnosis not present

## 2023-03-10 DIAGNOSIS — R630 Anorexia: Secondary | ICD-10-CM | POA: Diagnosis not present

## 2023-03-10 DIAGNOSIS — R52 Pain, unspecified: Secondary | ICD-10-CM | POA: Diagnosis not present

## 2023-03-10 DIAGNOSIS — R42 Dizziness and giddiness: Secondary | ICD-10-CM | POA: Diagnosis not present

## 2023-03-10 DIAGNOSIS — E785 Hyperlipidemia, unspecified: Secondary | ICD-10-CM | POA: Diagnosis not present

## 2023-03-10 DIAGNOSIS — R0602 Shortness of breath: Secondary | ICD-10-CM | POA: Diagnosis not present

## 2023-03-10 DIAGNOSIS — I519 Heart disease, unspecified: Secondary | ICD-10-CM | POA: Diagnosis not present

## 2023-03-10 DIAGNOSIS — K219 Gastro-esophageal reflux disease without esophagitis: Secondary | ICD-10-CM | POA: Diagnosis not present

## 2023-03-10 DIAGNOSIS — C3490 Malignant neoplasm of unspecified part of unspecified bronchus or lung: Secondary | ICD-10-CM | POA: Diagnosis not present

## 2023-03-10 DIAGNOSIS — I10 Essential (primary) hypertension: Secondary | ICD-10-CM | POA: Diagnosis not present

## 2023-03-10 DIAGNOSIS — I639 Cerebral infarction, unspecified: Secondary | ICD-10-CM | POA: Diagnosis not present

## 2023-03-10 DIAGNOSIS — F32A Depression, unspecified: Secondary | ICD-10-CM | POA: Diagnosis not present

## 2023-03-10 DIAGNOSIS — R531 Weakness: Secondary | ICD-10-CM | POA: Diagnosis not present

## 2023-03-10 DIAGNOSIS — F419 Anxiety disorder, unspecified: Secondary | ICD-10-CM | POA: Diagnosis not present

## 2023-03-10 DIAGNOSIS — Z8546 Personal history of malignant neoplasm of prostate: Secondary | ICD-10-CM | POA: Diagnosis not present

## 2023-03-10 DIAGNOSIS — I480 Paroxysmal atrial fibrillation: Secondary | ICD-10-CM | POA: Diagnosis not present

## 2023-03-10 DIAGNOSIS — R112 Nausea with vomiting, unspecified: Secondary | ICD-10-CM | POA: Diagnosis not present

## 2023-03-11 DIAGNOSIS — R0602 Shortness of breath: Secondary | ICD-10-CM | POA: Diagnosis not present

## 2023-03-11 DIAGNOSIS — R630 Anorexia: Secondary | ICD-10-CM | POA: Diagnosis not present

## 2023-03-11 DIAGNOSIS — R531 Weakness: Secondary | ICD-10-CM | POA: Diagnosis not present

## 2023-03-11 DIAGNOSIS — R112 Nausea with vomiting, unspecified: Secondary | ICD-10-CM | POA: Diagnosis not present

## 2023-03-11 DIAGNOSIS — K219 Gastro-esophageal reflux disease without esophagitis: Secondary | ICD-10-CM | POA: Diagnosis not present

## 2023-03-11 DIAGNOSIS — C3490 Malignant neoplasm of unspecified part of unspecified bronchus or lung: Secondary | ICD-10-CM | POA: Diagnosis not present

## 2023-03-11 DIAGNOSIS — Z8546 Personal history of malignant neoplasm of prostate: Secondary | ICD-10-CM | POA: Diagnosis not present

## 2023-03-11 DIAGNOSIS — R42 Dizziness and giddiness: Secondary | ICD-10-CM | POA: Diagnosis not present

## 2023-03-11 DIAGNOSIS — I10 Essential (primary) hypertension: Secondary | ICD-10-CM | POA: Diagnosis not present

## 2023-03-11 DIAGNOSIS — F32A Depression, unspecified: Secondary | ICD-10-CM | POA: Diagnosis not present

## 2023-03-11 DIAGNOSIS — R52 Pain, unspecified: Secondary | ICD-10-CM | POA: Diagnosis not present

## 2023-03-11 DIAGNOSIS — I480 Paroxysmal atrial fibrillation: Secondary | ICD-10-CM | POA: Diagnosis not present

## 2023-03-11 DIAGNOSIS — E785 Hyperlipidemia, unspecified: Secondary | ICD-10-CM | POA: Diagnosis not present

## 2023-03-11 DIAGNOSIS — I519 Heart disease, unspecified: Secondary | ICD-10-CM | POA: Diagnosis not present

## 2023-03-11 DIAGNOSIS — F419 Anxiety disorder, unspecified: Secondary | ICD-10-CM | POA: Diagnosis not present

## 2023-03-11 DIAGNOSIS — I639 Cerebral infarction, unspecified: Secondary | ICD-10-CM | POA: Diagnosis not present

## 2023-03-12 DIAGNOSIS — K219 Gastro-esophageal reflux disease without esophagitis: Secondary | ICD-10-CM | POA: Diagnosis not present

## 2023-03-12 DIAGNOSIS — I519 Heart disease, unspecified: Secondary | ICD-10-CM | POA: Diagnosis not present

## 2023-03-12 DIAGNOSIS — E785 Hyperlipidemia, unspecified: Secondary | ICD-10-CM | POA: Diagnosis not present

## 2023-03-12 DIAGNOSIS — C3490 Malignant neoplasm of unspecified part of unspecified bronchus or lung: Secondary | ICD-10-CM | POA: Diagnosis not present

## 2023-03-12 DIAGNOSIS — R112 Nausea with vomiting, unspecified: Secondary | ICD-10-CM | POA: Diagnosis not present

## 2023-03-12 DIAGNOSIS — I639 Cerebral infarction, unspecified: Secondary | ICD-10-CM | POA: Diagnosis not present

## 2023-03-12 DIAGNOSIS — R531 Weakness: Secondary | ICD-10-CM | POA: Diagnosis not present

## 2023-03-12 DIAGNOSIS — I480 Paroxysmal atrial fibrillation: Secondary | ICD-10-CM | POA: Diagnosis not present

## 2023-03-12 DIAGNOSIS — R52 Pain, unspecified: Secondary | ICD-10-CM | POA: Diagnosis not present

## 2023-03-12 DIAGNOSIS — F32A Depression, unspecified: Secondary | ICD-10-CM | POA: Diagnosis not present

## 2023-03-12 DIAGNOSIS — Z8546 Personal history of malignant neoplasm of prostate: Secondary | ICD-10-CM | POA: Diagnosis not present

## 2023-03-12 DIAGNOSIS — I10 Essential (primary) hypertension: Secondary | ICD-10-CM | POA: Diagnosis not present

## 2023-03-12 DIAGNOSIS — R42 Dizziness and giddiness: Secondary | ICD-10-CM | POA: Diagnosis not present

## 2023-03-12 DIAGNOSIS — R0602 Shortness of breath: Secondary | ICD-10-CM | POA: Diagnosis not present

## 2023-03-12 DIAGNOSIS — F419 Anxiety disorder, unspecified: Secondary | ICD-10-CM | POA: Diagnosis not present

## 2023-03-12 DIAGNOSIS — R630 Anorexia: Secondary | ICD-10-CM | POA: Diagnosis not present

## 2023-03-13 DIAGNOSIS — K219 Gastro-esophageal reflux disease without esophagitis: Secondary | ICD-10-CM | POA: Diagnosis not present

## 2023-03-13 DIAGNOSIS — Z8546 Personal history of malignant neoplasm of prostate: Secondary | ICD-10-CM | POA: Diagnosis not present

## 2023-03-13 DIAGNOSIS — R112 Nausea with vomiting, unspecified: Secondary | ICD-10-CM | POA: Diagnosis not present

## 2023-03-13 DIAGNOSIS — F32A Depression, unspecified: Secondary | ICD-10-CM | POA: Diagnosis not present

## 2023-03-13 DIAGNOSIS — I480 Paroxysmal atrial fibrillation: Secondary | ICD-10-CM | POA: Diagnosis not present

## 2023-03-13 DIAGNOSIS — I639 Cerebral infarction, unspecified: Secondary | ICD-10-CM | POA: Diagnosis not present

## 2023-03-13 DIAGNOSIS — E785 Hyperlipidemia, unspecified: Secondary | ICD-10-CM | POA: Diagnosis not present

## 2023-03-13 DIAGNOSIS — R531 Weakness: Secondary | ICD-10-CM | POA: Diagnosis not present

## 2023-03-13 DIAGNOSIS — C3490 Malignant neoplasm of unspecified part of unspecified bronchus or lung: Secondary | ICD-10-CM | POA: Diagnosis not present

## 2023-03-13 DIAGNOSIS — R0602 Shortness of breath: Secondary | ICD-10-CM | POA: Diagnosis not present

## 2023-03-13 DIAGNOSIS — I519 Heart disease, unspecified: Secondary | ICD-10-CM | POA: Diagnosis not present

## 2023-03-13 DIAGNOSIS — R42 Dizziness and giddiness: Secondary | ICD-10-CM | POA: Diagnosis not present

## 2023-03-13 DIAGNOSIS — R52 Pain, unspecified: Secondary | ICD-10-CM | POA: Diagnosis not present

## 2023-03-13 DIAGNOSIS — R630 Anorexia: Secondary | ICD-10-CM | POA: Diagnosis not present

## 2023-03-13 DIAGNOSIS — I10 Essential (primary) hypertension: Secondary | ICD-10-CM | POA: Diagnosis not present

## 2023-03-13 DIAGNOSIS — F419 Anxiety disorder, unspecified: Secondary | ICD-10-CM | POA: Diagnosis not present

## 2023-03-14 DIAGNOSIS — R0602 Shortness of breath: Secondary | ICD-10-CM | POA: Diagnosis not present

## 2023-03-14 DIAGNOSIS — Z8546 Personal history of malignant neoplasm of prostate: Secondary | ICD-10-CM | POA: Diagnosis not present

## 2023-03-14 DIAGNOSIS — I480 Paroxysmal atrial fibrillation: Secondary | ICD-10-CM | POA: Diagnosis not present

## 2023-03-14 DIAGNOSIS — I519 Heart disease, unspecified: Secondary | ICD-10-CM | POA: Diagnosis not present

## 2023-03-14 DIAGNOSIS — R52 Pain, unspecified: Secondary | ICD-10-CM | POA: Diagnosis not present

## 2023-03-14 DIAGNOSIS — R42 Dizziness and giddiness: Secondary | ICD-10-CM | POA: Diagnosis not present

## 2023-03-14 DIAGNOSIS — I10 Essential (primary) hypertension: Secondary | ICD-10-CM | POA: Diagnosis not present

## 2023-03-14 DIAGNOSIS — F32A Depression, unspecified: Secondary | ICD-10-CM | POA: Diagnosis not present

## 2023-03-14 DIAGNOSIS — F419 Anxiety disorder, unspecified: Secondary | ICD-10-CM | POA: Diagnosis not present

## 2023-03-14 DIAGNOSIS — R630 Anorexia: Secondary | ICD-10-CM | POA: Diagnosis not present

## 2023-03-14 DIAGNOSIS — I639 Cerebral infarction, unspecified: Secondary | ICD-10-CM | POA: Diagnosis not present

## 2023-03-14 DIAGNOSIS — E785 Hyperlipidemia, unspecified: Secondary | ICD-10-CM | POA: Diagnosis not present

## 2023-03-14 DIAGNOSIS — C3490 Malignant neoplasm of unspecified part of unspecified bronchus or lung: Secondary | ICD-10-CM | POA: Diagnosis not present

## 2023-03-14 DIAGNOSIS — K219 Gastro-esophageal reflux disease without esophagitis: Secondary | ICD-10-CM | POA: Diagnosis not present

## 2023-03-14 DIAGNOSIS — R531 Weakness: Secondary | ICD-10-CM | POA: Diagnosis not present

## 2023-03-14 DIAGNOSIS — R112 Nausea with vomiting, unspecified: Secondary | ICD-10-CM | POA: Diagnosis not present

## 2023-03-15 DIAGNOSIS — R52 Pain, unspecified: Secondary | ICD-10-CM | POA: Diagnosis not present

## 2023-03-15 DIAGNOSIS — I480 Paroxysmal atrial fibrillation: Secondary | ICD-10-CM | POA: Diagnosis not present

## 2023-03-15 DIAGNOSIS — F32A Depression, unspecified: Secondary | ICD-10-CM | POA: Diagnosis not present

## 2023-03-15 DIAGNOSIS — E785 Hyperlipidemia, unspecified: Secondary | ICD-10-CM | POA: Diagnosis not present

## 2023-03-15 DIAGNOSIS — R630 Anorexia: Secondary | ICD-10-CM | POA: Diagnosis not present

## 2023-03-15 DIAGNOSIS — R112 Nausea with vomiting, unspecified: Secondary | ICD-10-CM | POA: Diagnosis not present

## 2023-03-15 DIAGNOSIS — C3490 Malignant neoplasm of unspecified part of unspecified bronchus or lung: Secondary | ICD-10-CM | POA: Diagnosis not present

## 2023-03-15 DIAGNOSIS — R0602 Shortness of breath: Secondary | ICD-10-CM | POA: Diagnosis not present

## 2023-03-15 DIAGNOSIS — K219 Gastro-esophageal reflux disease without esophagitis: Secondary | ICD-10-CM | POA: Diagnosis not present

## 2023-03-15 DIAGNOSIS — F419 Anxiety disorder, unspecified: Secondary | ICD-10-CM | POA: Diagnosis not present

## 2023-03-15 DIAGNOSIS — R42 Dizziness and giddiness: Secondary | ICD-10-CM | POA: Diagnosis not present

## 2023-03-15 DIAGNOSIS — I10 Essential (primary) hypertension: Secondary | ICD-10-CM | POA: Diagnosis not present

## 2023-03-15 DIAGNOSIS — I519 Heart disease, unspecified: Secondary | ICD-10-CM | POA: Diagnosis not present

## 2023-03-15 DIAGNOSIS — I639 Cerebral infarction, unspecified: Secondary | ICD-10-CM | POA: Diagnosis not present

## 2023-03-15 DIAGNOSIS — R531 Weakness: Secondary | ICD-10-CM | POA: Diagnosis not present

## 2023-03-15 DIAGNOSIS — Z8546 Personal history of malignant neoplasm of prostate: Secondary | ICD-10-CM | POA: Diagnosis not present

## 2023-03-16 DIAGNOSIS — I10 Essential (primary) hypertension: Secondary | ICD-10-CM | POA: Diagnosis not present

## 2023-03-16 DIAGNOSIS — R630 Anorexia: Secondary | ICD-10-CM | POA: Diagnosis not present

## 2023-03-16 DIAGNOSIS — R112 Nausea with vomiting, unspecified: Secondary | ICD-10-CM | POA: Diagnosis not present

## 2023-03-16 DIAGNOSIS — R52 Pain, unspecified: Secondary | ICD-10-CM | POA: Diagnosis not present

## 2023-03-16 DIAGNOSIS — C3490 Malignant neoplasm of unspecified part of unspecified bronchus or lung: Secondary | ICD-10-CM | POA: Diagnosis not present

## 2023-03-16 DIAGNOSIS — E785 Hyperlipidemia, unspecified: Secondary | ICD-10-CM | POA: Diagnosis not present

## 2023-03-16 DIAGNOSIS — I480 Paroxysmal atrial fibrillation: Secondary | ICD-10-CM | POA: Diagnosis not present

## 2023-03-16 DIAGNOSIS — K219 Gastro-esophageal reflux disease without esophagitis: Secondary | ICD-10-CM | POA: Diagnosis not present

## 2023-03-16 DIAGNOSIS — F32A Depression, unspecified: Secondary | ICD-10-CM | POA: Diagnosis not present

## 2023-03-16 DIAGNOSIS — R42 Dizziness and giddiness: Secondary | ICD-10-CM | POA: Diagnosis not present

## 2023-03-16 DIAGNOSIS — Z8546 Personal history of malignant neoplasm of prostate: Secondary | ICD-10-CM | POA: Diagnosis not present

## 2023-03-16 DIAGNOSIS — F419 Anxiety disorder, unspecified: Secondary | ICD-10-CM | POA: Diagnosis not present

## 2023-03-16 DIAGNOSIS — R531 Weakness: Secondary | ICD-10-CM | POA: Diagnosis not present

## 2023-03-16 DIAGNOSIS — R0602 Shortness of breath: Secondary | ICD-10-CM | POA: Diagnosis not present

## 2023-03-16 DIAGNOSIS — I519 Heart disease, unspecified: Secondary | ICD-10-CM | POA: Diagnosis not present

## 2023-03-16 DIAGNOSIS — I639 Cerebral infarction, unspecified: Secondary | ICD-10-CM | POA: Diagnosis not present

## 2023-03-17 DIAGNOSIS — R0602 Shortness of breath: Secondary | ICD-10-CM | POA: Diagnosis not present

## 2023-03-17 DIAGNOSIS — I10 Essential (primary) hypertension: Secondary | ICD-10-CM | POA: Diagnosis not present

## 2023-03-17 DIAGNOSIS — I519 Heart disease, unspecified: Secondary | ICD-10-CM | POA: Diagnosis not present

## 2023-03-17 DIAGNOSIS — I480 Paroxysmal atrial fibrillation: Secondary | ICD-10-CM | POA: Diagnosis not present

## 2023-03-17 DIAGNOSIS — F419 Anxiety disorder, unspecified: Secondary | ICD-10-CM | POA: Diagnosis not present

## 2023-03-17 DIAGNOSIS — Z8546 Personal history of malignant neoplasm of prostate: Secondary | ICD-10-CM | POA: Diagnosis not present

## 2023-03-17 DIAGNOSIS — C3490 Malignant neoplasm of unspecified part of unspecified bronchus or lung: Secondary | ICD-10-CM | POA: Diagnosis not present

## 2023-03-17 DIAGNOSIS — R52 Pain, unspecified: Secondary | ICD-10-CM | POA: Diagnosis not present

## 2023-03-17 DIAGNOSIS — R630 Anorexia: Secondary | ICD-10-CM | POA: Diagnosis not present

## 2023-03-17 DIAGNOSIS — R112 Nausea with vomiting, unspecified: Secondary | ICD-10-CM | POA: Diagnosis not present

## 2023-03-17 DIAGNOSIS — E785 Hyperlipidemia, unspecified: Secondary | ICD-10-CM | POA: Diagnosis not present

## 2023-03-17 DIAGNOSIS — R42 Dizziness and giddiness: Secondary | ICD-10-CM | POA: Diagnosis not present

## 2023-03-17 DIAGNOSIS — I639 Cerebral infarction, unspecified: Secondary | ICD-10-CM | POA: Diagnosis not present

## 2023-03-17 DIAGNOSIS — R531 Weakness: Secondary | ICD-10-CM | POA: Diagnosis not present

## 2023-03-17 DIAGNOSIS — F32A Depression, unspecified: Secondary | ICD-10-CM | POA: Diagnosis not present

## 2023-03-17 DIAGNOSIS — K219 Gastro-esophageal reflux disease without esophagitis: Secondary | ICD-10-CM | POA: Diagnosis not present

## 2023-03-18 DIAGNOSIS — Z8546 Personal history of malignant neoplasm of prostate: Secondary | ICD-10-CM | POA: Diagnosis not present

## 2023-03-18 DIAGNOSIS — I639 Cerebral infarction, unspecified: Secondary | ICD-10-CM | POA: Diagnosis not present

## 2023-03-18 DIAGNOSIS — K219 Gastro-esophageal reflux disease without esophagitis: Secondary | ICD-10-CM | POA: Diagnosis not present

## 2023-03-18 DIAGNOSIS — F419 Anxiety disorder, unspecified: Secondary | ICD-10-CM | POA: Diagnosis not present

## 2023-03-18 DIAGNOSIS — R42 Dizziness and giddiness: Secondary | ICD-10-CM | POA: Diagnosis not present

## 2023-03-18 DIAGNOSIS — R112 Nausea with vomiting, unspecified: Secondary | ICD-10-CM | POA: Diagnosis not present

## 2023-03-18 DIAGNOSIS — I10 Essential (primary) hypertension: Secondary | ICD-10-CM | POA: Diagnosis not present

## 2023-03-18 DIAGNOSIS — R531 Weakness: Secondary | ICD-10-CM | POA: Diagnosis not present

## 2023-03-18 DIAGNOSIS — F32A Depression, unspecified: Secondary | ICD-10-CM | POA: Diagnosis not present

## 2023-03-18 DIAGNOSIS — R0602 Shortness of breath: Secondary | ICD-10-CM | POA: Diagnosis not present

## 2023-03-18 DIAGNOSIS — C3490 Malignant neoplasm of unspecified part of unspecified bronchus or lung: Secondary | ICD-10-CM | POA: Diagnosis not present

## 2023-03-18 DIAGNOSIS — I480 Paroxysmal atrial fibrillation: Secondary | ICD-10-CM | POA: Diagnosis not present

## 2023-03-18 DIAGNOSIS — R630 Anorexia: Secondary | ICD-10-CM | POA: Diagnosis not present

## 2023-03-18 DIAGNOSIS — R52 Pain, unspecified: Secondary | ICD-10-CM | POA: Diagnosis not present

## 2023-03-18 DIAGNOSIS — E785 Hyperlipidemia, unspecified: Secondary | ICD-10-CM | POA: Diagnosis not present

## 2023-03-18 DIAGNOSIS — I519 Heart disease, unspecified: Secondary | ICD-10-CM | POA: Diagnosis not present

## 2023-03-19 DIAGNOSIS — F32A Depression, unspecified: Secondary | ICD-10-CM | POA: Diagnosis not present

## 2023-03-19 DIAGNOSIS — R112 Nausea with vomiting, unspecified: Secondary | ICD-10-CM | POA: Diagnosis not present

## 2023-03-19 DIAGNOSIS — I639 Cerebral infarction, unspecified: Secondary | ICD-10-CM | POA: Diagnosis not present

## 2023-03-19 DIAGNOSIS — E785 Hyperlipidemia, unspecified: Secondary | ICD-10-CM | POA: Diagnosis not present

## 2023-03-19 DIAGNOSIS — R531 Weakness: Secondary | ICD-10-CM | POA: Diagnosis not present

## 2023-03-19 DIAGNOSIS — R42 Dizziness and giddiness: Secondary | ICD-10-CM | POA: Diagnosis not present

## 2023-03-19 DIAGNOSIS — R630 Anorexia: Secondary | ICD-10-CM | POA: Diagnosis not present

## 2023-03-19 DIAGNOSIS — F419 Anxiety disorder, unspecified: Secondary | ICD-10-CM | POA: Diagnosis not present

## 2023-03-19 DIAGNOSIS — R52 Pain, unspecified: Secondary | ICD-10-CM | POA: Diagnosis not present

## 2023-03-19 DIAGNOSIS — I480 Paroxysmal atrial fibrillation: Secondary | ICD-10-CM | POA: Diagnosis not present

## 2023-03-19 DIAGNOSIS — Z8546 Personal history of malignant neoplasm of prostate: Secondary | ICD-10-CM | POA: Diagnosis not present

## 2023-03-19 DIAGNOSIS — K219 Gastro-esophageal reflux disease without esophagitis: Secondary | ICD-10-CM | POA: Diagnosis not present

## 2023-03-19 DIAGNOSIS — R0602 Shortness of breath: Secondary | ICD-10-CM | POA: Diagnosis not present

## 2023-03-19 DIAGNOSIS — C3490 Malignant neoplasm of unspecified part of unspecified bronchus or lung: Secondary | ICD-10-CM | POA: Diagnosis not present

## 2023-03-19 DIAGNOSIS — I10 Essential (primary) hypertension: Secondary | ICD-10-CM | POA: Diagnosis not present

## 2023-03-19 DIAGNOSIS — I519 Heart disease, unspecified: Secondary | ICD-10-CM | POA: Diagnosis not present

## 2023-03-20 DIAGNOSIS — Z8546 Personal history of malignant neoplasm of prostate: Secondary | ICD-10-CM | POA: Diagnosis not present

## 2023-03-20 DIAGNOSIS — F419 Anxiety disorder, unspecified: Secondary | ICD-10-CM | POA: Diagnosis not present

## 2023-03-20 DIAGNOSIS — F32A Depression, unspecified: Secondary | ICD-10-CM | POA: Diagnosis not present

## 2023-03-20 DIAGNOSIS — R0602 Shortness of breath: Secondary | ICD-10-CM | POA: Diagnosis not present

## 2023-03-20 DIAGNOSIS — C3490 Malignant neoplasm of unspecified part of unspecified bronchus or lung: Secondary | ICD-10-CM | POA: Diagnosis not present

## 2023-03-20 DIAGNOSIS — R531 Weakness: Secondary | ICD-10-CM | POA: Diagnosis not present

## 2023-03-20 DIAGNOSIS — R630 Anorexia: Secondary | ICD-10-CM | POA: Diagnosis not present

## 2023-03-20 DIAGNOSIS — I519 Heart disease, unspecified: Secondary | ICD-10-CM | POA: Diagnosis not present

## 2023-03-20 DIAGNOSIS — E785 Hyperlipidemia, unspecified: Secondary | ICD-10-CM | POA: Diagnosis not present

## 2023-03-20 DIAGNOSIS — K219 Gastro-esophageal reflux disease without esophagitis: Secondary | ICD-10-CM | POA: Diagnosis not present

## 2023-03-20 DIAGNOSIS — I639 Cerebral infarction, unspecified: Secondary | ICD-10-CM | POA: Diagnosis not present

## 2023-03-20 DIAGNOSIS — R42 Dizziness and giddiness: Secondary | ICD-10-CM | POA: Diagnosis not present

## 2023-03-20 DIAGNOSIS — R52 Pain, unspecified: Secondary | ICD-10-CM | POA: Diagnosis not present

## 2023-03-20 DIAGNOSIS — R112 Nausea with vomiting, unspecified: Secondary | ICD-10-CM | POA: Diagnosis not present

## 2023-03-20 DIAGNOSIS — I10 Essential (primary) hypertension: Secondary | ICD-10-CM | POA: Diagnosis not present

## 2023-03-20 DIAGNOSIS — I480 Paroxysmal atrial fibrillation: Secondary | ICD-10-CM | POA: Diagnosis not present

## 2023-03-21 DIAGNOSIS — R0602 Shortness of breath: Secondary | ICD-10-CM | POA: Diagnosis not present

## 2023-03-21 DIAGNOSIS — F419 Anxiety disorder, unspecified: Secondary | ICD-10-CM | POA: Diagnosis not present

## 2023-03-21 DIAGNOSIS — C3490 Malignant neoplasm of unspecified part of unspecified bronchus or lung: Secondary | ICD-10-CM | POA: Diagnosis not present

## 2023-03-21 DIAGNOSIS — R52 Pain, unspecified: Secondary | ICD-10-CM | POA: Diagnosis not present

## 2023-03-21 DIAGNOSIS — I639 Cerebral infarction, unspecified: Secondary | ICD-10-CM | POA: Diagnosis not present

## 2023-03-21 DIAGNOSIS — F32A Depression, unspecified: Secondary | ICD-10-CM | POA: Diagnosis not present

## 2023-03-21 DIAGNOSIS — R112 Nausea with vomiting, unspecified: Secondary | ICD-10-CM | POA: Diagnosis not present

## 2023-03-21 DIAGNOSIS — R531 Weakness: Secondary | ICD-10-CM | POA: Diagnosis not present

## 2023-03-21 DIAGNOSIS — I10 Essential (primary) hypertension: Secondary | ICD-10-CM | POA: Diagnosis not present

## 2023-03-21 DIAGNOSIS — R42 Dizziness and giddiness: Secondary | ICD-10-CM | POA: Diagnosis not present

## 2023-03-21 DIAGNOSIS — K219 Gastro-esophageal reflux disease without esophagitis: Secondary | ICD-10-CM | POA: Diagnosis not present

## 2023-03-21 DIAGNOSIS — I480 Paroxysmal atrial fibrillation: Secondary | ICD-10-CM | POA: Diagnosis not present

## 2023-03-21 DIAGNOSIS — Z8546 Personal history of malignant neoplasm of prostate: Secondary | ICD-10-CM | POA: Diagnosis not present

## 2023-03-21 DIAGNOSIS — E785 Hyperlipidemia, unspecified: Secondary | ICD-10-CM | POA: Diagnosis not present

## 2023-03-21 DIAGNOSIS — I519 Heart disease, unspecified: Secondary | ICD-10-CM | POA: Diagnosis not present

## 2023-03-21 DIAGNOSIS — R630 Anorexia: Secondary | ICD-10-CM | POA: Diagnosis not present

## 2023-03-22 DIAGNOSIS — R531 Weakness: Secondary | ICD-10-CM | POA: Diagnosis not present

## 2023-03-22 DIAGNOSIS — I639 Cerebral infarction, unspecified: Secondary | ICD-10-CM | POA: Diagnosis not present

## 2023-03-22 DIAGNOSIS — R112 Nausea with vomiting, unspecified: Secondary | ICD-10-CM | POA: Diagnosis not present

## 2023-03-22 DIAGNOSIS — E785 Hyperlipidemia, unspecified: Secondary | ICD-10-CM | POA: Diagnosis not present

## 2023-03-22 DIAGNOSIS — I10 Essential (primary) hypertension: Secondary | ICD-10-CM | POA: Diagnosis not present

## 2023-03-22 DIAGNOSIS — K219 Gastro-esophageal reflux disease without esophagitis: Secondary | ICD-10-CM | POA: Diagnosis not present

## 2023-03-22 DIAGNOSIS — R52 Pain, unspecified: Secondary | ICD-10-CM | POA: Diagnosis not present

## 2023-03-22 DIAGNOSIS — R630 Anorexia: Secondary | ICD-10-CM | POA: Diagnosis not present

## 2023-03-22 DIAGNOSIS — F419 Anxiety disorder, unspecified: Secondary | ICD-10-CM | POA: Diagnosis not present

## 2023-03-22 DIAGNOSIS — I519 Heart disease, unspecified: Secondary | ICD-10-CM | POA: Diagnosis not present

## 2023-03-22 DIAGNOSIS — C3490 Malignant neoplasm of unspecified part of unspecified bronchus or lung: Secondary | ICD-10-CM | POA: Diagnosis not present

## 2023-03-22 DIAGNOSIS — R42 Dizziness and giddiness: Secondary | ICD-10-CM | POA: Diagnosis not present

## 2023-03-22 DIAGNOSIS — F32A Depression, unspecified: Secondary | ICD-10-CM | POA: Diagnosis not present

## 2023-03-22 DIAGNOSIS — I480 Paroxysmal atrial fibrillation: Secondary | ICD-10-CM | POA: Diagnosis not present

## 2023-03-22 DIAGNOSIS — Z8546 Personal history of malignant neoplasm of prostate: Secondary | ICD-10-CM | POA: Diagnosis not present

## 2023-03-22 DIAGNOSIS — R0602 Shortness of breath: Secondary | ICD-10-CM | POA: Diagnosis not present

## 2023-03-23 DIAGNOSIS — C3490 Malignant neoplasm of unspecified part of unspecified bronchus or lung: Secondary | ICD-10-CM | POA: Diagnosis not present

## 2023-03-23 DIAGNOSIS — M549 Dorsalgia, unspecified: Secondary | ICD-10-CM | POA: Diagnosis not present

## 2023-03-23 DIAGNOSIS — I480 Paroxysmal atrial fibrillation: Secondary | ICD-10-CM | POA: Diagnosis not present

## 2023-03-23 DIAGNOSIS — R0602 Shortness of breath: Secondary | ICD-10-CM | POA: Diagnosis not present

## 2023-03-23 DIAGNOSIS — M545 Low back pain, unspecified: Secondary | ICD-10-CM | POA: Diagnosis not present

## 2023-03-23 DIAGNOSIS — R531 Weakness: Secondary | ICD-10-CM | POA: Diagnosis not present

## 2023-03-23 DIAGNOSIS — R630 Anorexia: Secondary | ICD-10-CM | POA: Diagnosis not present

## 2023-03-23 DIAGNOSIS — E785 Hyperlipidemia, unspecified: Secondary | ICD-10-CM | POA: Diagnosis not present

## 2023-03-23 DIAGNOSIS — I519 Heart disease, unspecified: Secondary | ICD-10-CM | POA: Diagnosis not present

## 2023-03-23 DIAGNOSIS — Z8546 Personal history of malignant neoplasm of prostate: Secondary | ICD-10-CM | POA: Diagnosis not present

## 2023-03-23 DIAGNOSIS — K219 Gastro-esophageal reflux disease without esophagitis: Secondary | ICD-10-CM | POA: Diagnosis not present

## 2023-03-23 DIAGNOSIS — F419 Anxiety disorder, unspecified: Secondary | ICD-10-CM | POA: Diagnosis not present

## 2023-03-23 DIAGNOSIS — R112 Nausea with vomiting, unspecified: Secondary | ICD-10-CM | POA: Diagnosis not present

## 2023-03-23 DIAGNOSIS — I10 Essential (primary) hypertension: Secondary | ICD-10-CM | POA: Diagnosis not present

## 2023-03-23 DIAGNOSIS — R000 Tachycardia, unspecified: Secondary | ICD-10-CM | POA: Diagnosis not present

## 2023-03-23 DIAGNOSIS — I639 Cerebral infarction, unspecified: Secondary | ICD-10-CM | POA: Diagnosis not present

## 2023-03-23 DIAGNOSIS — R0689 Other abnormalities of breathing: Secondary | ICD-10-CM | POA: Diagnosis not present

## 2023-03-23 DIAGNOSIS — G8929 Other chronic pain: Secondary | ICD-10-CM | POA: Diagnosis not present

## 2023-03-23 DIAGNOSIS — F32A Depression, unspecified: Secondary | ICD-10-CM | POA: Diagnosis not present

## 2023-03-23 DIAGNOSIS — R52 Pain, unspecified: Secondary | ICD-10-CM | POA: Diagnosis not present

## 2023-03-23 DIAGNOSIS — R42 Dizziness and giddiness: Secondary | ICD-10-CM | POA: Diagnosis not present

## 2023-03-23 DIAGNOSIS — R079 Chest pain, unspecified: Secondary | ICD-10-CM | POA: Diagnosis not present

## 2023-03-24 DIAGNOSIS — R52 Pain, unspecified: Secondary | ICD-10-CM | POA: Diagnosis not present

## 2023-03-24 DIAGNOSIS — C3490 Malignant neoplasm of unspecified part of unspecified bronchus or lung: Secondary | ICD-10-CM | POA: Diagnosis not present

## 2023-03-24 DIAGNOSIS — K219 Gastro-esophageal reflux disease without esophagitis: Secondary | ICD-10-CM | POA: Diagnosis not present

## 2023-03-24 DIAGNOSIS — R0602 Shortness of breath: Secondary | ICD-10-CM | POA: Diagnosis not present

## 2023-03-24 DIAGNOSIS — F419 Anxiety disorder, unspecified: Secondary | ICD-10-CM | POA: Diagnosis not present

## 2023-03-24 DIAGNOSIS — I639 Cerebral infarction, unspecified: Secondary | ICD-10-CM | POA: Diagnosis not present

## 2023-03-24 DIAGNOSIS — Z8546 Personal history of malignant neoplasm of prostate: Secondary | ICD-10-CM | POA: Diagnosis not present

## 2023-03-24 DIAGNOSIS — I519 Heart disease, unspecified: Secondary | ICD-10-CM | POA: Diagnosis not present

## 2023-03-24 DIAGNOSIS — I10 Essential (primary) hypertension: Secondary | ICD-10-CM | POA: Diagnosis not present

## 2023-03-24 DIAGNOSIS — E785 Hyperlipidemia, unspecified: Secondary | ICD-10-CM | POA: Diagnosis not present

## 2023-03-24 DIAGNOSIS — F32A Depression, unspecified: Secondary | ICD-10-CM | POA: Diagnosis not present

## 2023-03-24 DIAGNOSIS — R42 Dizziness and giddiness: Secondary | ICD-10-CM | POA: Diagnosis not present

## 2023-03-24 DIAGNOSIS — I480 Paroxysmal atrial fibrillation: Secondary | ICD-10-CM | POA: Diagnosis not present

## 2023-03-24 DIAGNOSIS — R531 Weakness: Secondary | ICD-10-CM | POA: Diagnosis not present

## 2023-03-24 DIAGNOSIS — R630 Anorexia: Secondary | ICD-10-CM | POA: Diagnosis not present

## 2023-03-24 DIAGNOSIS — R112 Nausea with vomiting, unspecified: Secondary | ICD-10-CM | POA: Diagnosis not present

## 2023-03-25 DIAGNOSIS — F419 Anxiety disorder, unspecified: Secondary | ICD-10-CM | POA: Diagnosis not present

## 2023-03-25 DIAGNOSIS — I10 Essential (primary) hypertension: Secondary | ICD-10-CM | POA: Diagnosis not present

## 2023-03-25 DIAGNOSIS — E785 Hyperlipidemia, unspecified: Secondary | ICD-10-CM | POA: Diagnosis not present

## 2023-03-25 DIAGNOSIS — Z8546 Personal history of malignant neoplasm of prostate: Secondary | ICD-10-CM | POA: Diagnosis not present

## 2023-03-25 DIAGNOSIS — R630 Anorexia: Secondary | ICD-10-CM | POA: Diagnosis not present

## 2023-03-25 DIAGNOSIS — R531 Weakness: Secondary | ICD-10-CM | POA: Diagnosis not present

## 2023-03-25 DIAGNOSIS — F32A Depression, unspecified: Secondary | ICD-10-CM | POA: Diagnosis not present

## 2023-03-25 DIAGNOSIS — R52 Pain, unspecified: Secondary | ICD-10-CM | POA: Diagnosis not present

## 2023-03-25 DIAGNOSIS — C3490 Malignant neoplasm of unspecified part of unspecified bronchus or lung: Secondary | ICD-10-CM | POA: Diagnosis not present

## 2023-03-25 DIAGNOSIS — R0602 Shortness of breath: Secondary | ICD-10-CM | POA: Diagnosis not present

## 2023-03-25 DIAGNOSIS — R112 Nausea with vomiting, unspecified: Secondary | ICD-10-CM | POA: Diagnosis not present

## 2023-03-25 DIAGNOSIS — K219 Gastro-esophageal reflux disease without esophagitis: Secondary | ICD-10-CM | POA: Diagnosis not present

## 2023-03-25 DIAGNOSIS — I519 Heart disease, unspecified: Secondary | ICD-10-CM | POA: Diagnosis not present

## 2023-03-25 DIAGNOSIS — I480 Paroxysmal atrial fibrillation: Secondary | ICD-10-CM | POA: Diagnosis not present

## 2023-03-25 DIAGNOSIS — I639 Cerebral infarction, unspecified: Secondary | ICD-10-CM | POA: Diagnosis not present

## 2023-03-25 DIAGNOSIS — R42 Dizziness and giddiness: Secondary | ICD-10-CM | POA: Diagnosis not present

## 2023-03-26 DIAGNOSIS — C3490 Malignant neoplasm of unspecified part of unspecified bronchus or lung: Secondary | ICD-10-CM | POA: Diagnosis not present

## 2023-03-26 DIAGNOSIS — R0602 Shortness of breath: Secondary | ICD-10-CM | POA: Diagnosis not present

## 2023-03-26 DIAGNOSIS — R531 Weakness: Secondary | ICD-10-CM | POA: Diagnosis not present

## 2023-03-26 DIAGNOSIS — R52 Pain, unspecified: Secondary | ICD-10-CM | POA: Diagnosis not present

## 2023-03-26 DIAGNOSIS — F419 Anxiety disorder, unspecified: Secondary | ICD-10-CM | POA: Diagnosis not present

## 2023-03-26 DIAGNOSIS — F32A Depression, unspecified: Secondary | ICD-10-CM | POA: Diagnosis not present

## 2023-03-26 DIAGNOSIS — I480 Paroxysmal atrial fibrillation: Secondary | ICD-10-CM | POA: Diagnosis not present

## 2023-03-26 DIAGNOSIS — K219 Gastro-esophageal reflux disease without esophagitis: Secondary | ICD-10-CM | POA: Diagnosis not present

## 2023-03-26 DIAGNOSIS — R112 Nausea with vomiting, unspecified: Secondary | ICD-10-CM | POA: Diagnosis not present

## 2023-03-26 DIAGNOSIS — I10 Essential (primary) hypertension: Secondary | ICD-10-CM | POA: Diagnosis not present

## 2023-03-26 DIAGNOSIS — R630 Anorexia: Secondary | ICD-10-CM | POA: Diagnosis not present

## 2023-03-26 DIAGNOSIS — R42 Dizziness and giddiness: Secondary | ICD-10-CM | POA: Diagnosis not present

## 2023-03-26 DIAGNOSIS — I639 Cerebral infarction, unspecified: Secondary | ICD-10-CM | POA: Diagnosis not present

## 2023-03-26 DIAGNOSIS — I519 Heart disease, unspecified: Secondary | ICD-10-CM | POA: Diagnosis not present

## 2023-03-26 DIAGNOSIS — Z8546 Personal history of malignant neoplasm of prostate: Secondary | ICD-10-CM | POA: Diagnosis not present

## 2023-03-26 DIAGNOSIS — E785 Hyperlipidemia, unspecified: Secondary | ICD-10-CM | POA: Diagnosis not present

## 2023-03-27 DIAGNOSIS — I639 Cerebral infarction, unspecified: Secondary | ICD-10-CM | POA: Diagnosis not present

## 2023-03-27 DIAGNOSIS — Z8546 Personal history of malignant neoplasm of prostate: Secondary | ICD-10-CM | POA: Diagnosis not present

## 2023-03-27 DIAGNOSIS — R42 Dizziness and giddiness: Secondary | ICD-10-CM | POA: Diagnosis not present

## 2023-03-27 DIAGNOSIS — C3490 Malignant neoplasm of unspecified part of unspecified bronchus or lung: Secondary | ICD-10-CM | POA: Diagnosis not present

## 2023-03-27 DIAGNOSIS — R112 Nausea with vomiting, unspecified: Secondary | ICD-10-CM | POA: Diagnosis not present

## 2023-03-27 DIAGNOSIS — I480 Paroxysmal atrial fibrillation: Secondary | ICD-10-CM | POA: Diagnosis not present

## 2023-03-27 DIAGNOSIS — E785 Hyperlipidemia, unspecified: Secondary | ICD-10-CM | POA: Diagnosis not present

## 2023-03-27 DIAGNOSIS — R531 Weakness: Secondary | ICD-10-CM | POA: Diagnosis not present

## 2023-03-27 DIAGNOSIS — R0602 Shortness of breath: Secondary | ICD-10-CM | POA: Diagnosis not present

## 2023-03-27 DIAGNOSIS — I10 Essential (primary) hypertension: Secondary | ICD-10-CM | POA: Diagnosis not present

## 2023-03-27 DIAGNOSIS — F419 Anxiety disorder, unspecified: Secondary | ICD-10-CM | POA: Diagnosis not present

## 2023-03-27 DIAGNOSIS — R52 Pain, unspecified: Secondary | ICD-10-CM | POA: Diagnosis not present

## 2023-03-27 DIAGNOSIS — I519 Heart disease, unspecified: Secondary | ICD-10-CM | POA: Diagnosis not present

## 2023-03-27 DIAGNOSIS — F32A Depression, unspecified: Secondary | ICD-10-CM | POA: Diagnosis not present

## 2023-03-27 DIAGNOSIS — K219 Gastro-esophageal reflux disease without esophagitis: Secondary | ICD-10-CM | POA: Diagnosis not present

## 2023-03-27 DIAGNOSIS — R630 Anorexia: Secondary | ICD-10-CM | POA: Diagnosis not present

## 2023-04-07 DIAGNOSIS — R079 Chest pain, unspecified: Secondary | ICD-10-CM | POA: Diagnosis not present

## 2023-04-07 DIAGNOSIS — M79603 Pain in arm, unspecified: Secondary | ICD-10-CM | POA: Diagnosis not present

## 2023-04-07 DIAGNOSIS — R4182 Altered mental status, unspecified: Secondary | ICD-10-CM | POA: Diagnosis not present

## 2023-04-07 DIAGNOSIS — R0902 Hypoxemia: Secondary | ICD-10-CM | POA: Diagnosis not present

## 2023-05-28 DEATH — deceased

## 2023-08-31 ENCOUNTER — Ambulatory Visit: Payer: Medicaid Other

## 2024-01-21 ENCOUNTER — Encounter (HOSPITAL_COMMUNITY): Payer: Self-pay | Admitting: Interventional Radiology
